# Patient Record
Sex: Female | Born: 1950 | Race: White | Hispanic: No | Marital: Married | Smoking: Former smoker
Health system: Southern US, Community
[De-identification: ages and names within clinical notes are randomized; demographics above are authoritative.]

## PROBLEM LIST (undated history)

## (undated) DIAGNOSIS — Z9889 Other specified postprocedural states: Secondary | ICD-10-CM

## (undated) DIAGNOSIS — K222 Esophageal obstruction: Secondary | ICD-10-CM

## (undated) DIAGNOSIS — N61 Mastitis without abscess: Secondary | ICD-10-CM

## (undated) DIAGNOSIS — M810 Age-related osteoporosis without current pathological fracture: Secondary | ICD-10-CM

## (undated) DIAGNOSIS — M1990 Unspecified osteoarthritis, unspecified site: Secondary | ICD-10-CM

## (undated) DIAGNOSIS — R002 Palpitations: Secondary | ICD-10-CM

## (undated) DIAGNOSIS — R079 Chest pain, unspecified: Secondary | ICD-10-CM

## (undated) DIAGNOSIS — F419 Anxiety disorder, unspecified: Secondary | ICD-10-CM

## (undated) DIAGNOSIS — M797 Fibromyalgia: Secondary | ICD-10-CM

## (undated) DIAGNOSIS — Z8489 Family history of other specified conditions: Secondary | ICD-10-CM

## (undated) DIAGNOSIS — I10 Essential (primary) hypertension: Secondary | ICD-10-CM

## (undated) DIAGNOSIS — R112 Nausea with vomiting, unspecified: Secondary | ICD-10-CM

## (undated) DIAGNOSIS — R251 Tremor, unspecified: Secondary | ICD-10-CM

## (undated) DIAGNOSIS — J189 Pneumonia, unspecified organism: Secondary | ICD-10-CM

## (undated) DIAGNOSIS — K219 Gastro-esophageal reflux disease without esophagitis: Secondary | ICD-10-CM

## (undated) DIAGNOSIS — T4145XA Adverse effect of unspecified anesthetic, initial encounter: Secondary | ICD-10-CM

## (undated) DIAGNOSIS — M1612 Unilateral primary osteoarthritis, left hip: Secondary | ICD-10-CM

## (undated) DIAGNOSIS — F32A Depression, unspecified: Secondary | ICD-10-CM

## (undated) DIAGNOSIS — T8859XA Other complications of anesthesia, initial encounter: Secondary | ICD-10-CM

## (undated) DIAGNOSIS — R011 Cardiac murmur, unspecified: Secondary | ICD-10-CM

## (undated) DIAGNOSIS — F329 Major depressive disorder, single episode, unspecified: Secondary | ICD-10-CM

## (undated) DIAGNOSIS — C50919 Malignant neoplasm of unspecified site of unspecified female breast: Secondary | ICD-10-CM

## (undated) DIAGNOSIS — K5790 Diverticulosis of intestine, part unspecified, without perforation or abscess without bleeding: Secondary | ICD-10-CM

## (undated) DIAGNOSIS — Z923 Personal history of irradiation: Secondary | ICD-10-CM

## (undated) DIAGNOSIS — R0602 Shortness of breath: Secondary | ICD-10-CM

## (undated) HISTORY — DX: Tremor, unspecified: R25.1

## (undated) HISTORY — PX: APPENDECTOMY: SHX54

## (undated) HISTORY — PX: CHOLECYSTECTOMY: SHX55

## (undated) HISTORY — DX: Shortness of breath: R06.02

## (undated) HISTORY — DX: Gastro-esophageal reflux disease without esophagitis: K21.9

## (undated) HISTORY — DX: Malignant neoplasm of unspecified site of unspecified female breast: C50.919

## (undated) HISTORY — PX: TUBAL LIGATION: SHX77

## (undated) HISTORY — PX: COLONOSCOPY: SHX174

## (undated) HISTORY — PX: ABDOMINAL HYSTERECTOMY: SHX81

## (undated) HISTORY — PX: CYSTECTOMY: SUR359

## (undated) HISTORY — PX: FRACTURE SURGERY: SHX138

## (undated) HISTORY — PX: TONSILLECTOMY: SUR1361

## (undated) HISTORY — DX: Mastitis without abscess: N61.0

## (undated) HISTORY — DX: Palpitations: R00.2

## (undated) HISTORY — DX: Diverticulosis of intestine, part unspecified, without perforation or abscess without bleeding: K57.90

## (undated) HISTORY — DX: Esophageal obstruction: K22.2

## (undated) HISTORY — PX: HAND SURGERY: SHX662

## (undated) HISTORY — DX: Chest pain, unspecified: R07.9

---
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
## 1978-11-30 DIAGNOSIS — F339 Major depressive disorder, recurrent, unspecified: Secondary | ICD-10-CM | POA: Insufficient documentation

## 1998-09-26 ENCOUNTER — Encounter: Payer: Self-pay | Admitting: Surgery

## 1998-09-27 ENCOUNTER — Ambulatory Visit (HOSPITAL_COMMUNITY): Admission: RE | Admit: 1998-09-27 | Discharge: 1998-09-27 | Payer: Self-pay | Admitting: Surgery

## 1999-09-30 ENCOUNTER — Other Ambulatory Visit: Admission: RE | Admit: 1999-09-30 | Discharge: 1999-09-30 | Payer: Self-pay | Admitting: Obstetrics and Gynecology

## 2000-04-14 ENCOUNTER — Encounter: Admission: RE | Admit: 2000-04-14 | Discharge: 2000-04-14 | Payer: Self-pay | Admitting: Otolaryngology

## 2000-04-14 ENCOUNTER — Encounter: Payer: Self-pay | Admitting: Otolaryngology

## 2000-05-06 ENCOUNTER — Other Ambulatory Visit: Admission: RE | Admit: 2000-05-06 | Discharge: 2000-05-06 | Payer: Self-pay | Admitting: Otolaryngology

## 2000-05-06 ENCOUNTER — Encounter (INDEPENDENT_AMBULATORY_CARE_PROVIDER_SITE_OTHER): Payer: Self-pay | Admitting: Specialist

## 2000-12-13 ENCOUNTER — Other Ambulatory Visit: Admission: RE | Admit: 2000-12-13 | Discharge: 2000-12-13 | Payer: Self-pay | Admitting: Obstetrics and Gynecology

## 2001-04-08 ENCOUNTER — Encounter: Payer: Self-pay | Admitting: Internal Medicine

## 2001-04-15 ENCOUNTER — Encounter: Payer: Self-pay | Admitting: Internal Medicine

## 2002-01-04 ENCOUNTER — Other Ambulatory Visit: Admission: RE | Admit: 2002-01-04 | Discharge: 2002-01-04 | Payer: Self-pay | Admitting: Obstetrics and Gynecology

## 2002-01-04 ENCOUNTER — Encounter: Admission: RE | Admit: 2002-01-04 | Discharge: 2002-04-04 | Payer: Self-pay | Admitting: Obstetrics and Gynecology

## 2003-04-11 ENCOUNTER — Ambulatory Visit (HOSPITAL_BASED_OUTPATIENT_CLINIC_OR_DEPARTMENT_OTHER): Admission: RE | Admit: 2003-04-11 | Discharge: 2003-04-11 | Payer: Self-pay | Admitting: *Deleted

## 2003-04-11 ENCOUNTER — Encounter (INDEPENDENT_AMBULATORY_CARE_PROVIDER_SITE_OTHER): Payer: Self-pay | Admitting: *Deleted

## 2003-07-23 ENCOUNTER — Other Ambulatory Visit: Admission: RE | Admit: 2003-07-23 | Discharge: 2003-07-23 | Payer: Self-pay | Admitting: Obstetrics and Gynecology

## 2004-07-14 ENCOUNTER — Encounter: Admission: RE | Admit: 2004-07-14 | Discharge: 2004-07-14 | Payer: Self-pay | Admitting: Family Medicine

## 2004-07-25 ENCOUNTER — Encounter: Payer: Self-pay | Admitting: Internal Medicine

## 2005-09-01 ENCOUNTER — Encounter: Admission: RE | Admit: 2005-09-01 | Discharge: 2005-09-01 | Payer: Self-pay | Admitting: Family Medicine

## 2007-10-13 ENCOUNTER — Encounter: Admission: RE | Admit: 2007-10-13 | Discharge: 2007-10-13 | Payer: Self-pay | Admitting: Family Medicine

## 2008-11-30 DIAGNOSIS — C50919 Malignant neoplasm of unspecified site of unspecified female breast: Secondary | ICD-10-CM

## 2008-11-30 HISTORY — PX: BREAST LUMPECTOMY: SHX2

## 2008-11-30 HISTORY — DX: Malignant neoplasm of unspecified site of unspecified female breast: C50.919

## 2008-12-17 ENCOUNTER — Encounter: Admission: RE | Admit: 2008-12-17 | Discharge: 2008-12-17 | Payer: Self-pay | Admitting: Family Medicine

## 2008-12-19 ENCOUNTER — Encounter: Admission: RE | Admit: 2008-12-19 | Discharge: 2008-12-19 | Payer: Self-pay | Admitting: Family Medicine

## 2008-12-28 ENCOUNTER — Encounter: Admission: RE | Admit: 2008-12-28 | Discharge: 2008-12-28 | Payer: Self-pay | Admitting: Family Medicine

## 2008-12-28 ENCOUNTER — Encounter (INDEPENDENT_AMBULATORY_CARE_PROVIDER_SITE_OTHER): Payer: Self-pay | Admitting: Diagnostic Radiology

## 2009-01-08 ENCOUNTER — Encounter: Admission: RE | Admit: 2009-01-08 | Discharge: 2009-01-08 | Payer: Self-pay | Admitting: Family Medicine

## 2009-01-22 ENCOUNTER — Ambulatory Visit (HOSPITAL_BASED_OUTPATIENT_CLINIC_OR_DEPARTMENT_OTHER): Admission: RE | Admit: 2009-01-22 | Discharge: 2009-01-22 | Payer: Self-pay | Admitting: Surgery

## 2009-01-22 ENCOUNTER — Encounter (INDEPENDENT_AMBULATORY_CARE_PROVIDER_SITE_OTHER): Payer: Self-pay | Admitting: Surgery

## 2009-01-22 ENCOUNTER — Encounter: Admission: RE | Admit: 2009-01-22 | Discharge: 2009-01-22 | Payer: Self-pay | Admitting: Surgery

## 2009-01-28 ENCOUNTER — Ambulatory Visit: Payer: Self-pay | Admitting: Oncology

## 2009-01-29 ENCOUNTER — Encounter: Payer: Self-pay | Admitting: Internal Medicine

## 2009-02-06 ENCOUNTER — Encounter: Payer: Self-pay | Admitting: Internal Medicine

## 2009-02-06 LAB — CBC WITH DIFFERENTIAL/PLATELET
BASO%: 0.8 % (ref 0.0–2.0)
Eosinophils Absolute: 0.3 10*3/uL (ref 0.0–0.5)
HCT: 38.7 % (ref 34.8–46.6)
LYMPH%: 29 % (ref 14.0–49.7)
MCHC: 33.9 g/dL (ref 31.5–36.0)
MCV: 89.1 fL (ref 79.5–101.0)
MONO%: 7.2 % (ref 0.0–14.0)
NEUT%: 61 % (ref 38.4–76.8)
Platelets: 307 10*3/uL (ref 145–400)
RBC: 4.35 10*6/uL (ref 3.70–5.45)

## 2009-02-08 LAB — COMPREHENSIVE METABOLIC PANEL
Alkaline Phosphatase: 158 U/L — ABNORMAL HIGH (ref 39–117)
Creatinine, Ser: 0.72 mg/dL (ref 0.40–1.20)
Glucose, Bld: 126 mg/dL — ABNORMAL HIGH (ref 70–99)
Sodium: 136 mEq/L (ref 135–145)
Total Bilirubin: 0.6 mg/dL (ref 0.3–1.2)
Total Protein: 7.3 g/dL (ref 6.0–8.3)

## 2009-02-08 LAB — LACTATE DEHYDROGENASE: LDH: 160 U/L (ref 94–250)

## 2009-02-08 LAB — CANCER ANTIGEN 27.29: CA 27.29: 15 U/mL (ref 0–39)

## 2009-02-12 ENCOUNTER — Ambulatory Visit (HOSPITAL_COMMUNITY): Admission: RE | Admit: 2009-02-12 | Discharge: 2009-02-12 | Payer: Self-pay | Admitting: Oncology

## 2009-02-12 ENCOUNTER — Encounter (INDEPENDENT_AMBULATORY_CARE_PROVIDER_SITE_OTHER): Payer: Self-pay | Admitting: *Deleted

## 2009-02-14 ENCOUNTER — Ambulatory Visit: Admission: RE | Admit: 2009-02-14 | Discharge: 2009-04-30 | Payer: Self-pay | Admitting: Radiation Oncology

## 2009-02-15 ENCOUNTER — Encounter: Payer: Self-pay | Admitting: Internal Medicine

## 2009-02-18 ENCOUNTER — Encounter: Payer: Self-pay | Admitting: Internal Medicine

## 2009-02-19 ENCOUNTER — Encounter: Payer: Self-pay | Admitting: Oncology

## 2009-02-19 ENCOUNTER — Other Ambulatory Visit: Admission: RE | Admit: 2009-02-19 | Discharge: 2009-02-19 | Payer: Self-pay | Admitting: Oncology

## 2009-02-19 LAB — CBC WITH DIFFERENTIAL/PLATELET
Basophils Absolute: 0.2 10*3/uL — ABNORMAL HIGH (ref 0.0–0.1)
Eosinophils Absolute: 0.4 10*3/uL (ref 0.0–0.5)
HCT: 40.9 % (ref 34.8–46.6)
HGB: 13.6 g/dL (ref 11.6–15.9)
LYMPH%: 35.1 % (ref 14.0–49.7)
MCHC: 33.3 g/dL (ref 31.5–36.0)
MONO#: 1.4 10*3/uL — ABNORMAL HIGH (ref 0.1–0.9)
NEUT#: 8 10*3/uL — ABNORMAL HIGH (ref 1.5–6.5)
NEUT%: 52.3 % (ref 38.4–76.8)
Platelets: 349 10*3/uL (ref 145–400)
WBC: 15.3 10*3/uL — ABNORMAL HIGH (ref 3.9–10.3)

## 2009-02-19 LAB — CHCC SMEAR

## 2009-02-19 LAB — MORPHOLOGY
PLT EST: ADEQUATE
RBC Comments: NORMAL

## 2009-03-06 ENCOUNTER — Encounter: Admission: RE | Admit: 2009-03-06 | Discharge: 2009-03-06 | Payer: Self-pay | Admitting: Oncology

## 2009-03-25 ENCOUNTER — Ambulatory Visit: Payer: Self-pay | Admitting: Oncology

## 2009-03-27 LAB — TSH: TSH: 0.822 u[IU]/mL (ref 0.350–4.500)

## 2009-03-27 LAB — FSH/LH: LH: 22.1 m[IU]/mL

## 2009-04-24 ENCOUNTER — Ambulatory Visit: Payer: Self-pay | Admitting: Internal Medicine

## 2009-04-24 DIAGNOSIS — K222 Esophageal obstruction: Secondary | ICD-10-CM

## 2009-04-24 DIAGNOSIS — K589 Irritable bowel syndrome without diarrhea: Secondary | ICD-10-CM

## 2009-04-24 DIAGNOSIS — Z86010 Personal history of colonic polyps: Secondary | ICD-10-CM

## 2009-04-24 DIAGNOSIS — K219 Gastro-esophageal reflux disease without esophagitis: Secondary | ICD-10-CM

## 2009-04-24 DIAGNOSIS — R197 Diarrhea, unspecified: Secondary | ICD-10-CM

## 2009-04-24 DIAGNOSIS — E119 Type 2 diabetes mellitus without complications: Secondary | ICD-10-CM

## 2009-04-30 ENCOUNTER — Ambulatory Visit: Payer: Self-pay | Admitting: Internal Medicine

## 2009-07-30 ENCOUNTER — Ambulatory Visit: Payer: Self-pay | Admitting: Oncology

## 2010-05-02 ENCOUNTER — Encounter: Payer: Self-pay | Admitting: Family Medicine

## 2010-05-27 IMAGING — MG MM SCREEN MAMMOGRAM BILATERAL
4 series · 4 of 4 positions shown · non-contrast
Comparison: none

DG SCREEN MAMMOGRAM BILATERAL
Bilateral CC and MLO view(s) were taken.

DIGITAL SCREENING MAMMOGRAM WITH CAD:
There are scattered fibroglandular densities.  Possible asymmetries are noted in both breasts.  
Spot compression views and possibly sonography are recommended for further evaluation.  Compared 
with prior studies.

[R CC]
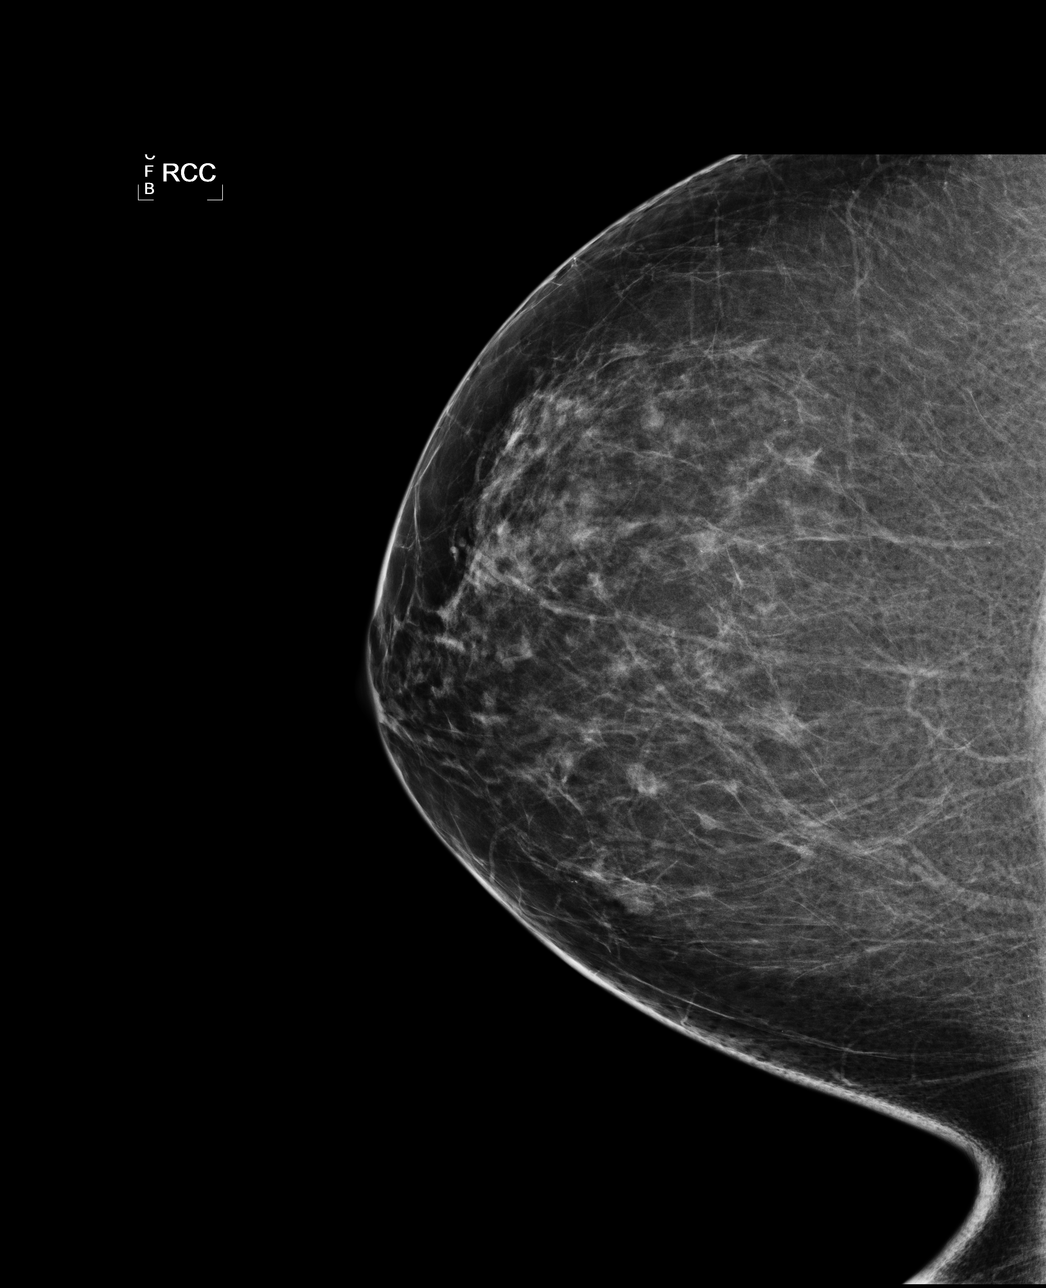

[L CC]
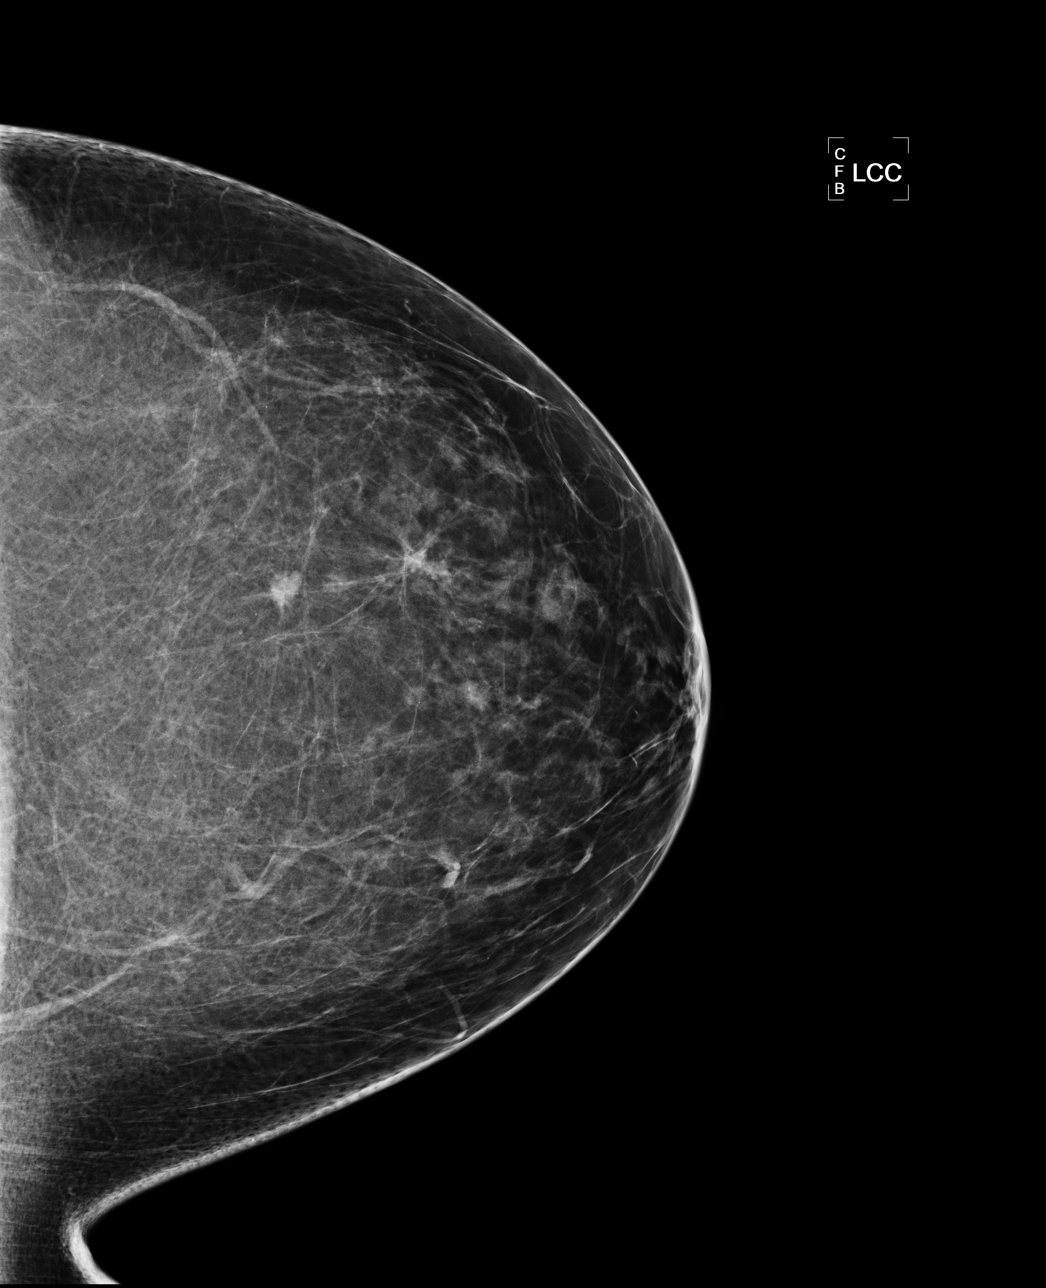

[L MLO]
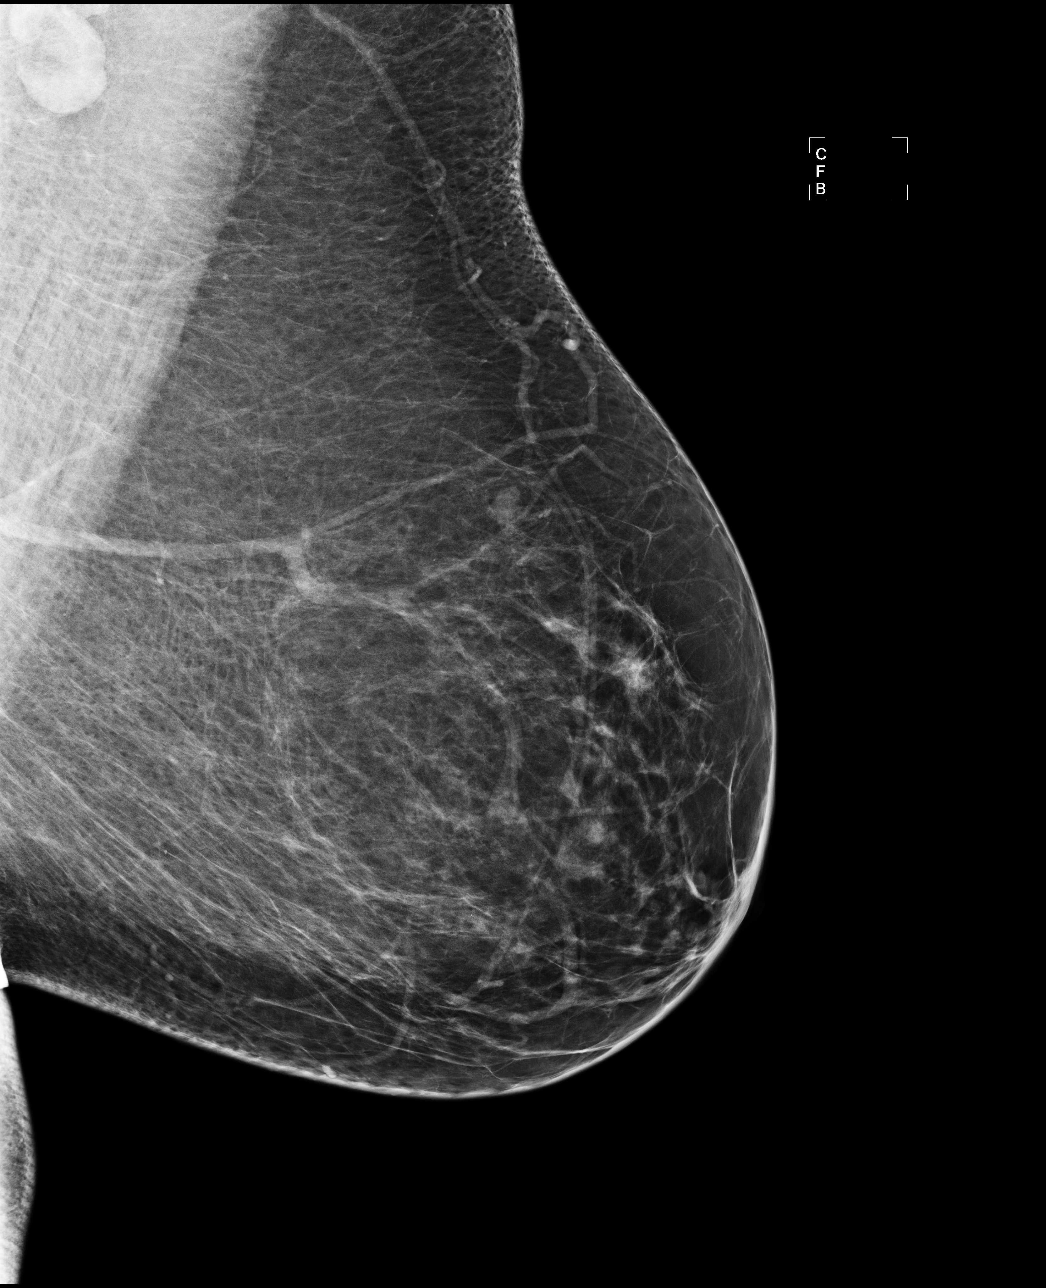

[R MLO]
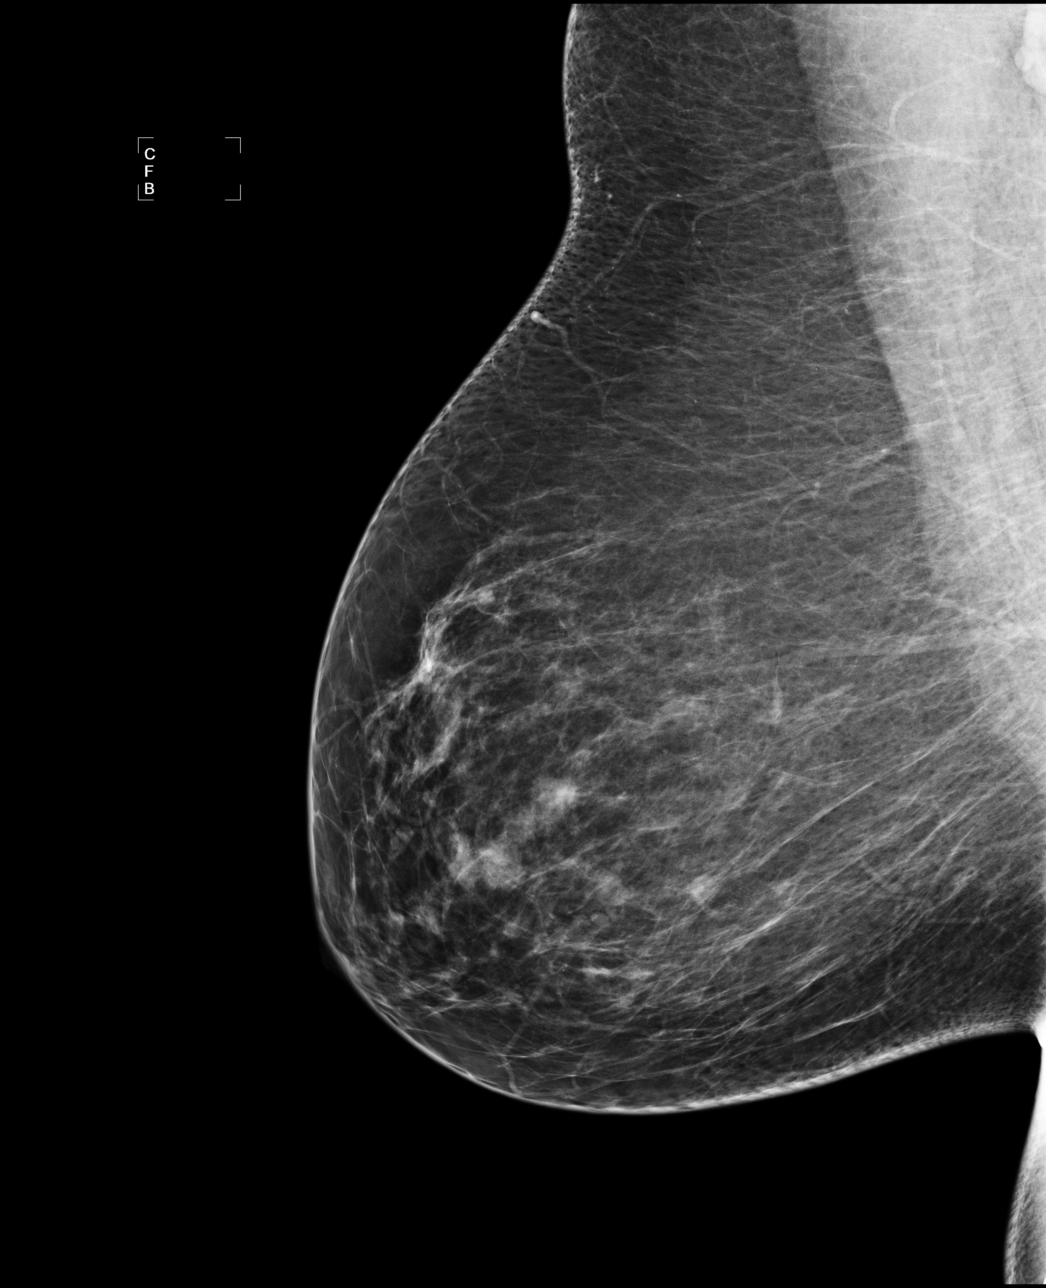

[4 of 4 positions shown; findings below may reference images not displayed]

IMPRESSION: Possible asymmetries, bilateral breasts.  Additional evaluation is indicated.  The patient will be 
contacted for additional studies and a supplementary report will follow.

ASSESSMENT: Need additional imaging evaluation and/or prior mammograms for comparison - BI-RADS 0

Further imaging of both breasts.
ANALYZED BY COMPUTER AIDED DETECTION. , THIS PROCEDURE WAS A DIGITAL MAMMOGRAM.

## 2010-06-03 ENCOUNTER — Ambulatory Visit: Payer: Self-pay | Admitting: Oncology

## 2010-06-05 LAB — CBC WITH DIFFERENTIAL/PLATELET
BASO%: 1.6 % (ref 0.0–2.0)
EOS%: 2.1 % (ref 0.0–7.0)
Eosinophils Absolute: 0.2 10*3/uL (ref 0.0–0.5)
HCT: 39 % (ref 34.8–46.6)
LYMPH%: 28.6 % (ref 14.0–49.7)
MONO#: 0.8 10*3/uL (ref 0.1–0.9)
NEUT#: 5.8 10*3/uL (ref 1.5–6.5)
RDW: 13.6 % (ref 11.2–14.5)
lymph#: 2.8 10*3/uL (ref 0.9–3.3)

## 2010-06-06 LAB — COMPREHENSIVE METABOLIC PANEL
ALT: 68 U/L — ABNORMAL HIGH (ref 0–35)
AST: 114 U/L — ABNORMAL HIGH (ref 0–37)
CO2: 22 mEq/L (ref 19–32)
Calcium: 9.1 mg/dL (ref 8.4–10.5)
Total Bilirubin: 0.6 mg/dL (ref 0.3–1.2)
Total Protein: 7.2 g/dL (ref 6.0–8.3)

## 2010-06-06 LAB — VITAMIN D 25 HYDROXY (VIT D DEFICIENCY, FRACTURES): Vit D, 25-Hydroxy: 32 ng/mL (ref 30–89)

## 2010-06-06 LAB — LACTATE DEHYDROGENASE: LDH: 172 U/L (ref 94–250)

## 2010-06-16 ENCOUNTER — Ambulatory Visit (HOSPITAL_COMMUNITY): Admission: RE | Admit: 2010-06-16 | Discharge: 2010-06-16 | Payer: Self-pay | Admitting: Oncology

## 2010-08-22 ENCOUNTER — Encounter: Admission: RE | Admit: 2010-08-22 | Discharge: 2010-08-22 | Payer: Self-pay | Admitting: Neurosurgery

## 2010-12-11 ENCOUNTER — Ambulatory Visit: Payer: Self-pay | Admitting: Oncology

## 2011-03-09 LAB — GLUCOSE, CAPILLARY

## 2011-03-12 LAB — GLUCOSE, CAPILLARY: Glucose-Capillary: 170 mg/dL — ABNORMAL HIGH (ref 70–99)

## 2011-03-17 LAB — CBC
Platelets: 266 10*3/uL (ref 150–400)
WBC: 11.7 10*3/uL — ABNORMAL HIGH (ref 4.0–10.5)

## 2011-03-17 LAB — COMPREHENSIVE METABOLIC PANEL
AST: 66 U/L — ABNORMAL HIGH (ref 0–37)
Albumin: 4 g/dL (ref 3.5–5.2)
Alkaline Phosphatase: 132 U/L — ABNORMAL HIGH (ref 39–117)
Chloride: 101 mEq/L (ref 96–112)
Creatinine, Ser: 0.71 mg/dL (ref 0.4–1.2)
GFR calc Af Amer: >60 mL/min (ref >60)
Potassium: 5.1 mEq/L (ref 3.5–5.1)
Sodium: 138 mEq/L (ref 135–145)
Total Bilirubin: 0.6 mg/dL (ref 0.3–1.2)

## 2011-03-17 LAB — DIFFERENTIAL
Basophils Absolute: 0.1 10*3/uL (ref 0.0–0.1)
Eosinophils Relative: 2 % (ref 0–5)
Lymphocytes Relative: 36 % (ref 12–46)
Monocytes Absolute: 0.7 10*3/uL (ref 0.1–1.0)

## 2011-04-14 NOTE — Op Note (Signed)
NAME:  Cassandra Johnston, Cassandra Johnston               ACCOUNT NO.:  [id number]   MEDICAL RECORD NO.:  [id number]          PATIENT TYPE:  AMB   LOCATION:  DSC                          FACILITY:  [facility]   PHYSICIAN:  Sandria Bales. Ezzard Standing, M.D.  DATE OF BIRTH:  January 03, 1951   DATE OF PROCEDURE:  01/22/2009  DATE OF DISCHARGE:                               OPERATIVE REPORT   Date of Surgery ?   PREOPERATIVE DIAGNOSIS:  Left breast carcinoma approximately 8 mm by  ultrasound at the 2 o'clock position.   POSTOPERATIVE DIAGNOSES:  Left breast carcinoma approximately 8 mm by  ultrasound at the 2 o'clock position with a negative sentinel lymph node  biopsy read by Dr. Brain Hilts (T1b N0 Mx).   PROCEDURES:  1. Injection of left breast with methylene blue.  2. Left breast lumpectomy with needle local.  3. Left axillary sentinel lymph node biopsy.   SURGEON:  Sandria Bales. Ezzard Standing, MD   FIRST ASSISTANT:  None.   ANESTHESIA:  General endotracheal about 30 mL of 0.25% Marcaine.   COMPLICATIONS:  None.   INDICATIONS FOR PROCEDURE:  Cassandra Johnston is a 60 year old white female,  patient of Dr. Evelena Peat who had a recent mammogram that showed a  lesion in both breasts.  In the left breast about the 1 o'clock  position, she had a nodule measuring 8 mm, which underwent biopsy which  showed invasive ductal carcinoma.  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   I discussed with her the options for treating this which include  mastectomy versus lumpectomy.  I think, she is a candidate for  lumpectomy.  I discussed the indications and potential risk of surgery.  Potential risks include bleeding, infection, nerve injury, and  recurrence of the tumor.  I also talked about a sentinel lymph node  biopsy.  She now comes for attempted lumpectomy and sentinel lymph node  biopsy with possible axillary node dissection.   OPERATIVE NOTE:  The patient was placed in a supine position.  She was  admitted to the breast  center and put a wire in her left breast coming  out at about 2-3 o'clock position.  The breast and axilla were prepped  with Betadine solution and sterilely draped and a time-out was held  identifying the patient and procedure.  I had marked her left breast  preoperatively.  A time out was held to identify the patient and the  procedure.   I first went down to find the axillary sentinel lymph node.  She had  been injected with 1 mCi of silver chloride preoperatively.  I had also  immediately preop injected her with about a mL and a half of 60%  methylene blue.  I cut down and found a sentinel lymph node with counts  of 1600 and a background was less than 10.  I think it was like 2 nodes  were sent, Dr. Nedra Hai called back and said that touch preps of these lymph  nodes were negative.  I removed some additional fat tissue, which had no  count and apparently, did not  have any lymph nodes, but I sent it off  anyway as a separate specimen.  I then put a gauze in the left axilla.   I then turned my attention to the left breast.  Again, she had a wire  emanating from left breast in the lower quadrant in the 1 to 2 o'clock  position about 3-4 cm off the edge of the areola.  I made an elliptical  incision and excised a block of breast tissue of 4 cm in diameter.  Once  the block of breast tissue was removed, I used the paint kit to paint  the specimen oriented to 6 sides.  We then shot a specimen mammogram at  the breast center.  The images showed the clip and the wire were in good  position and we marked the specimen for pathology.   So, the specimen was felt to be adequate.  I then went back and examined  the axilla and the breast.  I controlled hemostasis with Bovie  electrocautery using 3-0 Vicryl sutures also.  I then closed a  subcutaneous tissue with interrupted 3-0 Vicryl suture.  The skin with a  5-0 subcuticular Vicryl suture, painted the wounds with tincture of  Benzoin and  Steri-Strips.   The patient tolerated the procedure well and was transported to recovery  room in good condition.  Sponge and needle count were correct at the end  of case.  The patient will be discharged home today to return to see me  in 7-14 days for followup.      Sandria Bales. Ezzard Standing, M.D.  Electronically Signed     DHN/MEDQ  D:  01/22/2009  T:  01/23/2009  Job:  [id number]   cc:   Evelena Peat, M.D.  Wilhemina Bonito. Marina Goodell, MD

## 2011-04-15 ENCOUNTER — Other Ambulatory Visit: Payer: Self-pay | Admitting: Surgery

## 2011-04-15 ENCOUNTER — Other Ambulatory Visit: Payer: Self-pay | Admitting: Family Medicine

## 2011-04-15 DIAGNOSIS — Z9889 Other specified postprocedural states: Secondary | ICD-10-CM

## 2011-04-17 NOTE — Op Note (Signed)
   NAME:  Cassandra Johnston, Cassandra Johnston                         ACCOUNT NO.:  [id number]   MEDICAL RECORD NO.:  [id number]                   PATIENT TYPE:  AMB   LOCATION:  DSC                                  FACILITY:  [facility]   PHYSICIAN:  Lowell Bouton, M.D.      DATE OF BIRTH:  [birth year]   DATE OF PROCEDURE:  04/11/2003  DATE OF DISCHARGE:                                 OPERATIVE REPORT   PREOPERATIVE DIAGNOSIS:  Dorsal ganglion, left wrist.   POSTOPERATIVE DIAGNOSIS:  Dorsal ganglion, left wrist.   OPERATION PERFORMED:  Excision of dorsal ganglion, left wrist.   SURGEON:  Lowell Bouton, M.D.   ANESTHESIA:  General.   OPERATIVE FINDINGS:  The patient had a very small dorsal ganglion that arose  from the scapholunate interval.  She also had extension of her muscle belly  down to the carpus level of the EIP.   DESCRIPTION OF PROCEDURE:  Under general anesthesia with a tourniquet on the  left arm, the left hand was prepped and draped in the usual fashion.  After  exsanguinating the limb, the tourniquet was inflated to 250 mmHg.  A  transverse incision was made over the distal wrist crease.  Sharp dissection  was carried through the subcutaneous tissues.  Blunt dissection was carried  down through the subcutaneous fat and bleeding points were coagulated.  Blunt dissection was carried down to the mass and it was excised sharply  with a knife.  The capsule of the joint was opened and the mass was  completely excised.  The joint surfaces appeared to be relatively smooth.  It was noted that the EIP muscle extended down beyond the incision and so  that portion of the muscle was excised on the distal part.  The wound was  then irrigated copiously with saline.  The skin was closed over a vessel  loop drain using a 3-0 subcuticular Prolene.  Steri-Strips were applied  followed by sterile dressings and a volar wrist splint.  The patient  tolerated the procedure well and went to  the recovery room awake and stable  in  good condition.                                                Lowell Bouton, M.D.    EMM/MEDQ  D:  04/11/2003  T:  04/12/2003  Job:  [id number]

## 2011-07-08 ENCOUNTER — Other Ambulatory Visit: Payer: Self-pay | Admitting: Oncology

## 2011-07-08 ENCOUNTER — Encounter (HOSPITAL_BASED_OUTPATIENT_CLINIC_OR_DEPARTMENT_OTHER): Payer: Medicare Other | Admitting: Oncology

## 2011-07-08 DIAGNOSIS — E119 Type 2 diabetes mellitus without complications: Secondary | ICD-10-CM

## 2011-07-08 DIAGNOSIS — C50419 Malignant neoplasm of upper-outer quadrant of unspecified female breast: Secondary | ICD-10-CM

## 2011-07-08 DIAGNOSIS — Z170 Estrogen receptor positive status [ER+]: Secondary | ICD-10-CM

## 2011-07-08 LAB — COMPREHENSIVE METABOLIC PANEL
BUN: 11 mg/dL (ref 6–23)
CO2: 30 mEq/L (ref 19–32)
Creatinine, Ser: 0.71 mg/dL (ref 0.50–1.10)
Glucose, Bld: 252 mg/dL — ABNORMAL HIGH (ref 70–99)
Total Bilirubin: 0.3 mg/dL (ref 0.3–1.2)

## 2011-07-08 LAB — CBC WITH DIFFERENTIAL/PLATELET
Basophils Absolute: 0.1 10*3/uL (ref 0.0–0.1)
Eosinophils Absolute: 0.2 10*3/uL (ref 0.0–0.5)
HCT: 39.5 % (ref 34.8–46.6)
LYMPH%: 34.3 % (ref 14.0–49.7)
MCHC: 34.1 g/dL (ref 31.5–36.0)
MONO#: 0.7 10*3/uL (ref 0.1–0.9)
NEUT%: 55.1 % (ref 38.4–76.8)
Platelets: 191 10*3/uL (ref 145–400)
WBC: 9.6 10*3/uL (ref 3.9–10.3)

## 2011-07-09 LAB — VITAMIN D 25 HYDROXY (VIT D DEFICIENCY, FRACTURES): Vit D, 25-Hydroxy: 32 ng/mL (ref 30–89)

## 2011-07-09 LAB — CANCER ANTIGEN 27.29: CA 27.29: 17 U/mL (ref 0–39)

## 2011-07-16 ENCOUNTER — Ambulatory Visit
Admission: RE | Admit: 2011-07-16 | Discharge: 2011-07-16 | Disposition: A | Discharge status: Home / Self Care | Payer: Medicare Other | Source: Ambulatory Visit | Attending: Family Medicine | Admitting: Family Medicine

## 2011-07-16 DIAGNOSIS — Z9889 Other specified postprocedural states: Secondary | ICD-10-CM

## 2011-07-17 ENCOUNTER — Other Ambulatory Visit: Payer: Self-pay | Admitting: Dermatology

## 2011-08-05 ENCOUNTER — Other Ambulatory Visit: Payer: Self-pay | Admitting: Emergency Medicine

## 2011-08-05 ENCOUNTER — Ambulatory Visit
Admission: RE | Admit: 2011-08-05 | Discharge: 2011-08-05 | Disposition: A | Discharge status: Home / Self Care | Payer: Medicare Other | Source: Ambulatory Visit | Attending: Emergency Medicine | Admitting: Emergency Medicine

## 2011-08-05 ENCOUNTER — Inpatient Hospital Stay (INDEPENDENT_AMBULATORY_CARE_PROVIDER_SITE_OTHER): Admission: RE | Admit: 2011-08-05 | Payer: Medicare Other | Source: Ambulatory Visit | Admitting: Emergency Medicine

## 2011-08-05 ENCOUNTER — Encounter: Payer: Self-pay | Admitting: Emergency Medicine

## 2011-08-05 ENCOUNTER — Ambulatory Visit (INDEPENDENT_AMBULATORY_CARE_PROVIDER_SITE_OTHER): Payer: Medicare Other | Admitting: General Surgery

## 2011-08-05 VITALS — BP 138/86 | HR 66 | Temp 98.1°F | Ht 64.5 in | Wt 196.0 lb

## 2011-08-05 DIAGNOSIS — Z9889 Other specified postprocedural states: Secondary | ICD-10-CM

## 2011-08-05 DIAGNOSIS — N61 Mastitis without abscess: Secondary | ICD-10-CM

## 2011-08-05 NOTE — Progress Notes (Signed)
Patient is a 60 year old female with a history of left breast lumpectomy, sentinel lymph node biopsy, and radiation in 2010 for cancer of the left breast. She has not followed up with Dr. Ezzard Standing since. She has been on adjuvant tamoxifen. She reports no problems until about 2 days ago. She initially noted some fever. Yesterday she then noticed some pain in her left breast but did not examine it. Today she had a fever to 102 and redness of her breast.  She was initially seen at the breast center and has had an ultrasound that reportedly shows no abscess or fluid collection.  She does not have any previous history of cellulitis.  Exam: She is in no distress. She is afebrile in our office. The left breast shows very marked bright well defined erythema over the entire breast but not involving the axilla or chest. There is some edema of the skin. There is no mass, thickening, or particular tenderness.  Assessment and plan: Left breast cellulitis with history of lumpectomy, sentinel node biopsy and radiation in 2010. I have started her on oral Augmentin. She will call for any worsening symptoms and otherwise return to the office in 48 hours for a recheck.

## 2011-08-05 NOTE — Patient Instructions (Signed)
Call for any worsening symptoms.

## 2011-08-07 ENCOUNTER — Ambulatory Visit (INDEPENDENT_AMBULATORY_CARE_PROVIDER_SITE_OTHER): Payer: Medicare Other | Admitting: General Surgery

## 2011-08-07 VITALS — BP 128/80 | HR 62

## 2011-08-07 DIAGNOSIS — N61 Mastitis without abscess: Secondary | ICD-10-CM

## 2011-08-07 NOTE — Patient Instructions (Signed)
Call if not continuing to improve

## 2011-08-07 NOTE — Progress Notes (Signed)
Patient returns for a recheck of the cellulitis of her left breast with history of lumpectomy and sentinel lymph node biopsy remotely. We started her on Augmentin 2 days ago. She feels better in terms of her breast being less inflamed. Still some pain.  On examination there is still significant cellulitis of the left breast but it is clearly somewhat less erythematous and I believe has regressed slightly  This think is responding to antibiotics. I will not be in the office for over 2 weeks and will have her followup with Dr. Ezzard Standing in about 2 weeks.

## 2011-08-20 ENCOUNTER — Encounter (INDEPENDENT_AMBULATORY_CARE_PROVIDER_SITE_OTHER): Payer: Medicare Other | Admitting: Surgery

## 2011-09-15 ENCOUNTER — Encounter (INDEPENDENT_AMBULATORY_CARE_PROVIDER_SITE_OTHER): Payer: Self-pay

## 2011-09-18 ENCOUNTER — Encounter (INDEPENDENT_AMBULATORY_CARE_PROVIDER_SITE_OTHER): Payer: Medicare Other | Admitting: Surgery

## 2011-09-25 ENCOUNTER — Encounter: Payer: Self-pay | Admitting: Family Medicine

## 2011-09-25 ENCOUNTER — Inpatient Hospital Stay (INDEPENDENT_AMBULATORY_CARE_PROVIDER_SITE_OTHER)
Admission: RE | Admit: 2011-09-25 | Discharge: 2011-09-25 | Disposition: A | Discharge status: Home / Self Care | Payer: Medicare Other | Source: Ambulatory Visit | Attending: Family Medicine | Admitting: Family Medicine

## 2011-09-25 DIAGNOSIS — J029 Acute pharyngitis, unspecified: Secondary | ICD-10-CM

## 2011-09-25 DIAGNOSIS — R05 Cough: Secondary | ICD-10-CM

## 2011-09-25 DIAGNOSIS — E782 Mixed hyperlipidemia: Secondary | ICD-10-CM | POA: Insufficient documentation

## 2011-09-25 DIAGNOSIS — E785 Hyperlipidemia, unspecified: Secondary | ICD-10-CM

## 2011-09-25 DIAGNOSIS — J209 Acute bronchitis, unspecified: Secondary | ICD-10-CM

## 2011-09-25 LAB — CONVERTED CEMR LAB: Rapid Strep: NEGATIVE

## 2011-11-02 NOTE — Progress Notes (Signed)
Summary: infection on breast    Current Allergies: CODEINE Assessment New Problems: ABSCESS, BREAST (ICD-611.0)   Plan New Orders: No Charge Patient Arrived (NCPA0) [NCPA0] Planning Comments:   Pt sent directly to breast center in [place] due to her history of breast cancer and rapidly worsening breast infection/cellulitis.  She was not seen or evaluated here at our office.  Our nurse made her an appt for 1pm today.   The patient and/or caregiver has been counseled thoroughly with regard to medications prescribed including dosage, schedule, interactions, rationale for use, and possible side effects and they verbalize understanding.  Diagnoses and expected course of recovery discussed and will return if not improved as expected or if the condition worsens. Patient and/or caregiver verbalized understanding.   Orders Added: 1)  No Charge Patient Arrived (NCPA0) [NCPA0]

## 2011-11-02 NOTE — Progress Notes (Signed)
Summary: sore throat/cough/temp rm 4   Vital Signs:  Patient Profile:   60 Years Old Female CC:      cough, fever, sore throat, and hoarseness x 3 days Height:     64.5 inches Weight:      198 pounds O2 Sat:      95 % O2 treatment:    Room Air Temp:     100.2 degrees F oral Pulse rate:   85 / minute Resp:     20 per minute BP sitting:   136 / 74  (left arm) Cuff size:   large  Vitals Entered By: Clemens Catholic LPN (September 25, 2011 10:35 AM)                  Updated Prior Medication List: GLUCOPHAGE XR 500 MG XR24H-TAB (METFORMIN HCL) take 1 tablet by mouth in the morning and take 2 tablets by mouth in the evening GLIMEPIRIDE 4 MG TABS (GLIMEPIRIDE) Take 1 tablet by mouth once a day CITALOPRAM HYDROBROMIDE 20 MG TABS (CITALOPRAM HYDROBROMIDE) Take 1 tablet by mouth once a day FAMOTIDINE 40 MG TABS (FAMOTIDINE) Take 1 tablet by mouth once a day TAMOXIFEN CITRATE 20 MG TABS (TAMOXIFEN CITRATE) Take 1 tablet by mouth once a day  Current Allergies (reviewed today): CODEINEHistory of Present Illness Chief Complaint: cough, fever, sore throat, and hoarseness x 3 days History of Present Illness:  Subjective: Patient complains of URI symptoms that started 3 days ago with hoarseness and sinus congestion. + mild sore throat + cough for two days; non productive, worse at night.  She often coughs until she gags.  She states that she has not had a cough like this before.  She had an episode of pneumonia 8 years ago.  She believes that her last tetanus shot was 2006 No pleuritic pain No wheezing + nasal congestion + post-nasal drainage No sinus pain/pressure No itchy/red eyes No earache No hemoptysis No SOB No fever, + chills No nausea No vomiting No abdominal pain No diarrhea No skin rashes + fatigue No myalgias No headache Used OTC meds without relief (Mucinex)  REVIEW OF SYSTEMS Constitutional Symptoms       Complains of fever, chills, and night sweats.     Denies  weight loss, weight gain, and fatigue.  Eyes       Complains of glasses.      Denies change in vision, eye pain, eye discharge, contact lenses, and eye surgery. Ear/Nose/Throat/Mouth       Complains of sore throat and hoarseness.      Denies hearing loss/aids, change in hearing, ear pain, ear discharge, dizziness, frequent runny nose, frequent nose bleeds, sinus problems, and tooth pain or bleeding.  Respiratory       Complains of dry cough, wheezing, and shortness of breath.      Denies productive cough, asthma, bronchitis, and emphysema/COPD.  Cardiovascular       Denies murmurs, chest pain, and tires easily with exhertion.    Gastrointestinal       Denies stomach pain, nausea/vomiting, diarrhea, constipation, blood in bowel movements, and indigestion. Genitourniary       Denies painful urination, kidney stones, and loss of urinary control. Neurological       Complains of headaches.      Denies paralysis, seizures, and fainting/blackouts. Musculoskeletal       Denies muscle pain, joint pain, joint stiffness, decreased range of motion, redness, swelling, muscle weakness, and gout.  Skin  Denies bruising, unusual mles/lumps or sores, and hair/skin or nail changes.  Psych       Denies mood changes, temper/anger issues, anxiety/stress, speech problems, depression, and sleep problems. Blood-Lymph       Complains of unexplained lumps. Other Comments: pt c/o dry cough, fever, sore throat, and hoarseness x 3 days. she has taken Mucinex and Tylenol.   Past History:  Past Medical History: Anxiety Disorder Arthritis Breast Cancer Adenomatous Colon Polyps Diabetes Esophageal Stricture Hypertension Irritable Bowel Syndrome ? Trigeminal neuralgia Hyperlipidemia  Past Surgical History: Reviewed history from 04/24/2009 and no changes required. Breast-Lumpectomy left Cholecystectomy Knee Arthroscopy left Appendectomy Tonsillectomy Hysterectomy thyroid cyst removed  Family  History: Reviewed history from 04/24/2009 and no changes required. Family History of Colon Cancer: Mother, MGGM Family History of Diabetes: Mother  Social History: Reviewed history from 04/24/2009 and no changes required. Married, 1 girl, 1 boy Retired Patient is a former smoker. Quit 5 years ago Alcohol Use - no Illicit Drug Use - no Daily Caffeine Use 5 cups/day   Objective:  No acute distress; alert and oriented  Eyes:  Pupils are equal, round, and reactive to light and accomodation.  Extraocular movement is intact.  Conjunctivae are not inflamed.  Ears:  Canals normal.  Tympanic membranes normal.  Left TM may have small amount clear mucous behind it Nose:  Mildly congested turbinates.  No sinus tenderness  Pharynx:  Normal  Neck:  Supple.   Tender shotty anterior/posterior nodes are palpated bilaterally.  Lungs:  Clear to auscultation.  Breath sounds are equal.  No tenderness over sternum Heart:  Regular rate and rhythm without murmurs, rubs, or gallops.  Abdomen:  Nontender without masses or hepatosplenomegaly.  Bowel sounds are present.  No CVA or flank tenderness.  Extremities:  No edema.   Rapid strep test negative  Assessment New Problems: ACUTE PHARYNGITIS (ICD-462) BRONCHITIS, ACUTE (ICD-466.0) COUGH (ICD-786.2) HYPERLIPIDEMIA (ICD-272.4)  PROBABLY VIRAL URI, BUT COULD BE PERTUSSIS.  WILL TREAT AS A BRONCHITIS  Plan New Medications/Changes: BENZONATATE 200 MG CAPS (BENZONATATE) One by mouth hs as needed cough  #12 x 0, 09/25/2011, Donna Christen MD CLARITHROMYCIN 500 MG TABS (CLARITHROMYCIN) One Tab by mouth two times a day  #14 x 0, 09/25/2011, Donna Christen MD  New Orders: Pulse Oximetry (single measurment) [94760] Est. Patient Level IV [09811] Rapid Strep [91478] Planning Comments:   Begin Biaxin, expectorant/decongestant, topical decongestant, cough suppressant at bedtime.  Increase fluid intake. Recommend Tdap, pneumoccal vaccine, and flu shot when  well. Followup with PCP if not improving 7 to 10 days   The patient and/or caregiver has been counseled thoroughly with regard to medications prescribed including dosage, schedule, interactions, rationale for use, and possible side effects and they verbalize understanding.  Diagnoses and expected course of recovery discussed and will return if not improved as expected or if the condition worsens. Patient and/or caregiver verbalized understanding.  Prescriptions: BENZONATATE 200 MG CAPS (BENZONATATE) One by mouth hs as needed cough  #12 x 0   Entered and Authorized by:   Donna Christen MD   Signed by:   Donna Christen MD on 09/25/2011   Method used:   Print then Give to Patient   RxID:   [id number] CLARITHROMYCIN 500 MG TABS (CLARITHROMYCIN) One Tab by mouth two times a day  #14 x 0   Entered and Authorized by:   Donna Christen MD   Signed by:   Donna Christen MD on 09/25/2011   Method used:   Print then Give  to Patient   RxID:   [id number]   Patient Instructions: 1)  Take Mucinex (guaifenesin) twice daily for congestion. 2)  Increase fluid intake, rest. 3)  May add Sudafed for sinus congestion 4)  For sinus congestion may also use Afrin nasal spray (or generic oxymetazoline) twice daily for about 5 days.  Also recommend using saline nasal spray several times daily and/or saline nasal irrigation. 5)  Recommend Tdap, flu shot, and pneumococcal vaccine when well. 6)  Followup with family doctor if not improving 7 to 10 days.   Orders Added: 1)  Pulse Oximetry (single measurment) [94760] 2)  Est. Patient Level IV [14782] 3)  Rapid Strep [95621]    Laboratory Results  Date/Time Received: September 25, 2011 10:39 AM  Date/Time Reported: September 25, 2011 10:39 AM   Other Tests  Rapid Strep: negative  Kit Test Internal QC: Negative   (Normal Range: Negative)

## 2011-11-27 ENCOUNTER — Encounter (INDEPENDENT_AMBULATORY_CARE_PROVIDER_SITE_OTHER): Payer: Self-pay | Admitting: General Surgery

## 2011-11-27 ENCOUNTER — Ambulatory Visit (INDEPENDENT_AMBULATORY_CARE_PROVIDER_SITE_OTHER): Payer: Medicare Other | Admitting: General Surgery

## 2011-11-27 VITALS — BP 130/72 | HR 84 | Temp 98.2°F | Resp 20 | Ht 64.0 in | Wt 192.6 lb

## 2011-11-27 DIAGNOSIS — N61 Mastitis without abscess: Secondary | ICD-10-CM

## 2011-11-27 NOTE — Progress Notes (Signed)
History: Patient returns to the office due to recurrent cellulitis of her left breast. She has a remote history of lumpectomy and sentinel lymph node biopsy by Dr. Ezzard Standing. I saw her in September of this year with an acute cellulitis of her left breast. Recent mammogram had been normal. Ultrasound at that time was negative for mass or abscess. This responded over several days to oral Augmentin and as she felt well she did not return for followup. She was doing well until this morning when she awoke with fever and pain and tenderness in her left breast and again noted the severe redness. She was seen at [facility] in [place] and given 1 g of Rocephin IV and a prescription for doxycycline. She was referred here for evaluation.  Exam: Gen.: Moderately overweight Caucasian female in no distress she is afebrile. Breasts: Overlying a majority of the left breast is a sharply demarcated marked erythema and warmth. This does not extend down onto the chest wall. There is no induration or swelling or mass. Lymph nodes: No supraclavicular or axillary nodes palpable.  Assessment and plan: Recurrent cellulitis of the left breast with history of lumpectomy, sentinel lymph node biopsy and radiation. Imaging showed no evidence of recurrent cancer or abscess 3 months ago and based on her exam today I do not think that this needs to be repeated. She is receiving appropriate antibiotics. She will call us if there is any worsening or if it is not better after 3-4 days. We will get her back to see Dr. Ezzard Standing in 2 weeks.

## 2011-12-08 ENCOUNTER — Encounter (INDEPENDENT_AMBULATORY_CARE_PROVIDER_SITE_OTHER): Payer: Medicare Other | Admitting: General Surgery

## 2011-12-11 ENCOUNTER — Encounter (INDEPENDENT_AMBULATORY_CARE_PROVIDER_SITE_OTHER): Payer: Medicare Other | Admitting: Surgery

## 2011-12-25 ENCOUNTER — Telehealth (INDEPENDENT_AMBULATORY_CARE_PROVIDER_SITE_OTHER): Payer: Self-pay | Admitting: General Surgery

## 2011-12-25 NOTE — Telephone Encounter (Signed)
I CALLED RX OF DOXYCYCLINE 100MG  BID X 10DAYS TO [facility]/ [phone]/ PER V.O. DR. B. HOXWORTH/ PT NOTIFIED/ PT ALSO INSTRUCTED TO GO TO EMERGENCY DEPT IF REDNESS INCREASES OR TEMPERATURE DOES NOT RESPOND TO ANTIBIOTIC/GY

## 2011-12-25 NOTE — Telephone Encounter (Signed)
MS Bauder CALLED TO REPORT BREAST REDNESS NOTED THIS AM AND TEMP OF 101.1/ SHE WAS REQUESTING AN ANTIBIOTIC/ REVIEWED WITH DR. Ezzard Standing. HE HAS NOT SEEN HER SINCE 2010/ HE DID NOT FEEL COMFORTABLE CALLING IN AN ANTIBIOTIC WITHOUT HER BEING SEEN FIRST/ I ALSO REVIEWED THIS WITH DR. HOXWORTH . HE REVIEWED HIS OFFICE NOTES AND ORDERED DOXYCYCLINE TO BE CALLED INTO PHARMACY/G BID X 10DAYS/

## 2011-12-28 ENCOUNTER — Telehealth (INDEPENDENT_AMBULATORY_CARE_PROVIDER_SITE_OTHER): Payer: Self-pay | Admitting: General Surgery

## 2011-12-28 NOTE — Telephone Encounter (Signed)
Patient called status post being placed on antibiotics for left breast cellulitis. Patient states this has been her third episode of cellulitis in this breast in the past 6 months. She has a history of left breast cancer and she is very worried. She states her fever is getting better now. Breast is becoming less red and hot. She has been on antibiotics since Saturday. I offered an appt in urgent office today to check the breast and make sure there is not an abscess that needs drained. Patient denied appt. She wants to speak with Dr Johna Sheriff or Dr Ezzard Standing re: these breast infections and her breast cancer. She is very nervous. Please advise.

## 2011-12-30 ENCOUNTER — Telehealth (INDEPENDENT_AMBULATORY_CARE_PROVIDER_SITE_OTHER): Payer: Self-pay | Admitting: Surgery

## 2011-12-30 ENCOUNTER — Ambulatory Visit (INDEPENDENT_AMBULATORY_CARE_PROVIDER_SITE_OTHER): Payer: Medicare Other | Admitting: Surgery

## 2011-12-30 ENCOUNTER — Encounter (INDEPENDENT_AMBULATORY_CARE_PROVIDER_SITE_OTHER): Payer: Self-pay | Admitting: Surgery

## 2011-12-30 VITALS — BP 138/92 | HR 72 | Temp 97.4°F | Resp 24 | Ht 64.5 in | Wt 195.0 lb

## 2011-12-30 DIAGNOSIS — N61 Mastitis without abscess: Secondary | ICD-10-CM

## 2011-12-30 MED ORDER — NYSTATIN 100000 UNIT/ML MT SUSP: 500000.0000 [IU] | Freq: Four times a day (QID) | OROMUCOSAL | Status: AC

## 2011-12-30 NOTE — Telephone Encounter (Signed)
I scheduled the pt for 2/22 and left a message for the patient.

## 2011-12-30 NOTE — Patient Instructions (Signed)
Finish antibiotics.  Nystatin for mouth.  Return to see Dr Ezzard Standing in 2 weeks.

## 2011-12-30 NOTE — Progress Notes (Signed)
Subjective:     Patient ID: Cassandra Johnston, female   DOB: 21-Jul-1951, 61 y.o.   MRN: [id number]  HPI The patient presents to redness and swelling of her left breast. This started 5 days ago. She has a history of left breast lumpectomy with postoperative day she therapy for breast cancer in 2010. Dr. Ezzard Standing is her breast surgeon. She was placed on doxycycline and she has less redness and less pain. This has happened on 2 previous occasions over the last 12 months.   Review of Systems  Constitutional: Negative for fever and fatigue.  Skin: Positive for color change.  Neurological: Negative.   Hematological: Negative.   Psychiatric/Behavioral: Negative.        Objective:   Physical Exam  Constitutional: She appears well-developed and well-nourished.  HENT:  Head: Normocephalic and atraumatic.  Eyes: EOM are normal. Pupils are equal, round, and reactive to light.  Neck: Normal range of motion.  Skin:          Assessment:     Mastitis left breast with history of left breast cancer status post breast conserving surgery and postoperative radiation therapy.    Plan:     Continue doxycycline. Return in 2 weeks to see Dr. Ezzard Standing for followup. Clinically she is better.

## 2012-01-22 ENCOUNTER — Encounter (INDEPENDENT_AMBULATORY_CARE_PROVIDER_SITE_OTHER): Payer: Medicare Other | Admitting: Surgery

## 2012-01-27 ENCOUNTER — Ambulatory Visit: Payer: Medicare Other | Admitting: Physician Assistant

## 2012-01-27 ENCOUNTER — Other Ambulatory Visit: Payer: Medicare Other | Admitting: Lab

## 2012-02-03 ENCOUNTER — Encounter (INDEPENDENT_AMBULATORY_CARE_PROVIDER_SITE_OTHER): Payer: Medicare Other | Admitting: Surgery

## 2012-02-10 ENCOUNTER — Telehealth (INDEPENDENT_AMBULATORY_CARE_PROVIDER_SITE_OTHER): Payer: Self-pay | Admitting: Surgery

## 2012-02-10 ENCOUNTER — Other Ambulatory Visit (HOSPITAL_BASED_OUTPATIENT_CLINIC_OR_DEPARTMENT_OTHER): Payer: Medicare Other | Admitting: Lab

## 2012-02-10 ENCOUNTER — Encounter: Payer: Self-pay | Admitting: Physician Assistant

## 2012-02-10 ENCOUNTER — Telehealth: Payer: Self-pay | Admitting: *Deleted

## 2012-02-10 ENCOUNTER — Ambulatory Visit (HOSPITAL_BASED_OUTPATIENT_CLINIC_OR_DEPARTMENT_OTHER): Payer: Medicare Other | Admitting: Physician Assistant

## 2012-02-10 VITALS — BP 149/80 | HR 79 | Temp 98.1°F | Ht 64.5 in | Wt 198.2 lb

## 2012-02-10 DIAGNOSIS — C50919 Malignant neoplasm of unspecified site of unspecified female breast: Secondary | ICD-10-CM

## 2012-02-10 DIAGNOSIS — M810 Age-related osteoporosis without current pathological fracture: Secondary | ICD-10-CM

## 2012-02-10 DIAGNOSIS — N644 Mastodynia: Secondary | ICD-10-CM

## 2012-02-10 DIAGNOSIS — Z170 Estrogen receptor positive status [ER+]: Secondary | ICD-10-CM

## 2012-02-10 DIAGNOSIS — N61 Mastitis without abscess: Secondary | ICD-10-CM

## 2012-02-10 DIAGNOSIS — C50912 Malignant neoplasm of unspecified site of left female breast: Secondary | ICD-10-CM

## 2012-02-10 DIAGNOSIS — C50419 Malignant neoplasm of upper-outer quadrant of unspecified female breast: Secondary | ICD-10-CM

## 2012-02-10 DIAGNOSIS — Z79810 Long term (current) use of selective estrogen receptor modulators (SERMs): Secondary | ICD-10-CM

## 2012-02-10 LAB — COMPREHENSIVE METABOLIC PANEL
ALT: 91 U/L — ABNORMAL HIGH (ref 0–35)
AST: 100 U/L — ABNORMAL HIGH (ref 0–37)
Alkaline Phosphatase: 143 U/L — ABNORMAL HIGH (ref 39–117)
Chloride: 98 mEq/L (ref 96–112)
Creatinine, Ser: 0.65 mg/dL (ref 0.50–1.10)
Total Bilirubin: 0.4 mg/dL (ref 0.3–1.2)

## 2012-02-10 LAB — CBC WITH DIFFERENTIAL/PLATELET
BASO%: 1.2 % (ref 0.0–2.0)
EOS%: 2.8 % (ref 0.0–7.0)
LYMPH%: 35.4 % (ref 14.0–49.7)
MCH: 30.9 pg (ref 25.1–34.0)
MCHC: 33.5 g/dL (ref 31.5–36.0)
MONO#: 0.6 10*3/uL (ref 0.1–0.9)
MONO%: 7.2 % (ref 0.0–14.0)
Platelets: 192 10*3/uL (ref 145–400)
RBC: 4.2 10*6/uL (ref 3.70–5.45)
WBC: 8.8 10*3/uL (ref 3.9–10.3)

## 2012-02-10 LAB — CANCER ANTIGEN 27.29: CA 27.29: 26 U/mL (ref 0–39)

## 2012-02-10 MED ORDER — DOXYCYCLINE HYCLATE 100 MG PO TABS: 100.0000 mg | ORAL_TABLET | Freq: Every day | ORAL | Status: AC

## 2012-02-10 NOTE — Telephone Encounter (Signed)
This RN called pt to inform her of noted blood sugar per lab obtained today with reading of 426.  Cassandra Johnston states she is taking metformin and amaryl as prescribed. Blood sugars are monitored by Dr Benedetto Goad.  Plan is pt will recheck blood sugars at home and this RN will fax lab to Dr Benedetto Goad.

## 2012-02-10 NOTE — Progress Notes (Signed)
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 She underwent left lumpectomy and sentinel lymph node biopsy in February 2010 for a T1 B. N0, grade 1 invasive ductal carcinoma. Tumor was strongly ER and PR positive, HER-2/neu negative, with a low MIB-1.  Cassandra Johnston underwent radiation therapy which was completed in May of 2010. At that time she began on tamoxifen which she continues, 20 mg daily.  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 There's been no evidence, however, of lymphoma, either by films or clinically.  INTERVAL HISTORY: Cassandra Johnston returns today for routine six-month followup of her left breast carcinoma. Interval history is remarkable for 3 episodes of mastitis/cellulitis in the left breast since her appointment here in August.   The first episode was in early September, with signs of mastitis noted on her mammogram in August. She was started on doxycycline, which seemed to take care of the problem, but it recurred again in late December. Again it was treated with doxycycline, improved, then recurred again in January. Breast becomes extremely red, hot, and tender to touch. She typically begins to run fever, sometimes as high as 102, but this all resolves quickly once she started on doxycycline. Each time, she has been evaluated by one of her surgeons at [facility].  Today in fact she tells me her breast is beginning to feel sore, and is beginning to look red. She has been off of the doxycycline since early February.  Otherwise, Cassandra Johnston recently had a fall when she tripped over her son's dogs, and she thinks she has pulled a muscle in her back. She has been seen by Dr. Channing Mutters in the past for degenerative disc disease and in fact has received epidural injections as recently as September 2011. The  pain comes and goes, and is certainly worse with certain movements. She has no radiating pain, no numbness, no weakness in the legs.  Cassandra Johnston continues on tamoxifen which she is tolerating well. She does have hot flashes and vaginal dryness, but this has not changed since her last appointment here, and thus far has been tolerable. She has had no change in vision. No peripheral swelling, and no evidence of abnormal clotting.   REVIEW OF SYSTEMS: Otherwise, Cassandra Johnston's energy level is good. She's had no nausea or emesis, and no change in bowel habits. No signs of abnormal bleeding. No shortness of breath. No abnormal headaches, and no new or unusual pains elsewhere other than the back as noted above.  A detailed review of systems is otherwise unremarkable.  PAST MEDICAL HISTORY: Past Medical History  Diagnosis Date  . Breast cancer 2010    left  . Diabetes mellitus   . Breast infection     left breast    PAST SURGICAL HISTORY: Past Surgical History  Procedure Date  . Breast lumpectomy 2010  . Abdominal hysterectomy   . Cholecystectomy   . Appendectomy   . Tonsillectomy   . Tubal ligation   . Fracture surgery     feet  . Colonoscopy   . Hand surgery   . Cystectomy     on thyroid    FAMILY HISTORY Family History  Problem Relation Age of Onset  . Cancer Mother     colon  . Diabetes Mother    GYNECOLOGIC HISTORY:  She is GX  P2, first pregnancy to term age 40.  She had her hysterectomy at age 50.  She never took hormones.  She started having hot flashes about 5 or 6 years ago.  SOCIAL HISTORY:  She previously worked for [employer].  Her son, Cassandra Johnston, works in the ER currently, is married to Cassandra Johnston, who also works there.  The patient's father, Cassandra Johnston, used to work in the ER as well.  The patient's daughter, Cassandra Johnston, works at [employer].  The patient lives at home with her husband of 25 years, Cassandra Johnston, and keeps her daughter, Cassandra Johnston, 3 grandchildren who are 1, 8,  and 6.  She attends [employer].    ADVANCED DIRECTIVES:  HEALTH MAINTENANCE: History  Substance Use Topics  . Smoking status: Former Smoker    Quit date: 12/01/2003  . Smokeless tobacco: Not on file  . Alcohol Use: No     Colonoscopy:  PAP:  Bone density:  Lipid panel:  No Known Allergies  Current Outpatient Prescriptions  Medication Sig Dispense Refill  . carbamazepine (TEGRETOL) 200 MG tablet Take 200 mg by mouth 2 (two) times daily.      . citalopram (CELEXA) 40 MG tablet       . Famotidine (PEPCID PO) Take 50 mg by mouth 2 (two) times daily.        Marland Kitchen glimepiride (AMARYL) 4 MG tablet Take 4 mg by mouth daily before breakfast.        . metFORMIN (GLUCOPHAGE) 500 MG tablet Take 500 mg by mouth 2 (two) times daily with a meal.        . TAMOXIFEN CITRATE PO Take 1 tablet by mouth daily.        Marland Kitchen doxycycline (VIBRA-TABS) 100 MG tablet Take 1 tablet (100 mg total) by mouth daily.  30 tablet  1    OBJECTIVE: Filed Vitals:   02/10/12 1338  BP: 149/80  Pulse: 79  Temp: 98.1 F (36.7 C)     Body mass index is 33.50 kg/(m^2).    ECOG FS: 0  Physical Exam: HEENT:  Sclerae anicteric, conjunctivae pink.  Oropharynx clear.  No mucositis or candidiasis.   Nodes:  No cervical, supraclavicular, or axillary lymphadenopathy palpated.  Breast Exam:  Right breast is unremarkable, no masses, skin changes, or nipple inversion. Left breast is status post lumpectomy. There is diffuse erythema throughout the breast itself, slightly warm to the touch, and blanching. No lesions or rashes noted. No suspicious nodularities. He Lungs:  Clear to auscultation bilaterally.  No crackles, rhonchi, or wheezes.   Heart:  Regular rate and rhythm.   Abdomen:  Soft, obese, nontender.  Positive bowel sounds.  No organomegaly or masses palpated.   Musculoskeletal:  No focal spinal tenderness to palpation.  Extremities:  Benign.  No peripheral edema or cyanosis.   Skin:  Benign.   Neuro:   Nonfocal. Alert and oriented x3. His   LAB RESULTS: Lab Results  Component Value Date   WBC 8.8 02/10/2012   NEUTROABS 4.7 02/10/2012   HGB 13.0 02/10/2012   HCT 38.7 02/10/2012   MCV 92.2 02/10/2012   PLT 192 02/10/2012      Chemistry      Component Value Date/Time   NA 134* 02/10/2012 1320   K 4.2 02/10/2012 1320   CL 98 02/10/2012 1320   CO2 27 02/10/2012 1320   BUN 12 02/10/2012 1320   CREATININE 0.65 02/10/2012 1320      Component Value Date/Time   CALCIUM  9.6 02/10/2012 1320   ALKPHOS 143* 02/10/2012 1320   AST 100* 02/10/2012 1320   ALT 91* 02/10/2012 1320   BILITOT 0.4 02/10/2012 1320       Lab Results  Component Value Date   LABCA2 26 02/10/2012      STUDIES:  07/16/2011 *RADIOLOGY REPORT*  Clinical Data: Patient presents for a bilateral diagnostic  mammogram due to a history of a prior left malignant lumpectomy  2010.  DIGITAL DIAGNOSTIC BILATERAL MAMMOGRAM WITH CAD  Comparison: 05/02/2010 and 12/28/2008  Findings: Exam demonstrates scattered fibroglandular densities.  There are stable post lumpectomy changes of the upper outer left  breast. Remainder of the exam is unchanged.  Mammographic images were processed with CAD.  IMPRESSION:  Stable post lumpectomy changes of the upper outer left breast.  Recommendations: Recommend continued annual bilateral diagnostic  mammographic evaluation.  BI-RADS CATEGORY 2: Benign finding(s).   08/05/2011 *RADIOLOGY REPORT*  Clinical Data: The patient developed marked erythema and warmth of  the entire left breast on 08/04/2011 associated with a fever of 102  degrees. The patient underwent left lumpectomy and radiation  therapy for breast cancer in 2010.  LEFT BREAST ULTRASOUND  Comparison: Mammograms dated 07/16/2011, 05/02/2010 and 12/17/2008  On physical exam, there is marked erythema of the skin of the  entire left breast.  Findings: Ultrasound is performed, showing marked skin thickening  and edema throughout the  left breast but no focal fluid collection  to suggest abscess.  IMPRESSION:  Findings are consistent with acute left mastitis. No abscess  identified. The patient will be seen in the [facility] urgent clinic this afternoon.  Next diagnostic mammogram is suggested in August 2013.  BI-RADS CATEGORY 2: Benign finding(s).  Original Report Authenticated By: Daryl Eastern, M.D.   ASSESSMENT: A 61 year old [place] woman  (1)   status post left lumpectomy and sentinel lymph node biopsy February 2010 for a T1b N0 grade 1 invasive ductal carcinoma, strongly estrogen and progesterone receptor positive, HER2 negative, with a low MIB-1.    (2)  After radiation therapy was completed in May 2010 she started tamoxifen which she continues.    (3)  She also had a sentinel lymph node showing atypical follicles with some BCL-2 positive cells showing a t(14; 18) translocation.  There has been no evidence of lymphoma, either by films or clinically.   (4)  multiple episodes of mastitis in the left breast.  PLAN: This case was reviewed with Dr. Darnelle Catalan was also examined the patient today. It is very unusual that Cassandra Johnston continues to have recurrent mastitis in the left breast. This does seem to be developing once again, Dr. Darnelle Catalan would like to evaluate further. We will initiate doxycycline, 100 mg daily for the next 6 weeks. We'll also refer her back for a diagnostic mammogram of the left breast followed by a breast MRI if possible.  Cassandra Johnston will see her surgeon in approximately 4 weeks, and will return to see [place] in 6 weeks as she completes her course of doxycycline. We'll discuss further treatment at that time.  Otherwise Cassandra Johnston seems to be doing quite well, and she will continue on her tamoxifen which has been refilled. She will call with any changes or problems prior to her next scheduled appointment.  Everitt Wenner    02/10/2012

## 2012-02-11 ENCOUNTER — Telehealth: Payer: Self-pay | Admitting: *Deleted

## 2012-02-11 NOTE — Telephone Encounter (Signed)
[facility] and stated that this patient has missed several appointments in the past because of the patient track they requested that we leave the patient appointment

## 2012-02-11 NOTE — Telephone Encounter (Signed)
I discussed this with Lawson Fiscal at Dr Darrall Dears.  We will leave the appt as is for now.

## 2012-02-12 ENCOUNTER — Telehealth: Payer: Self-pay | Admitting: *Deleted

## 2012-02-12 NOTE — Telephone Encounter (Signed)
made patient for dr.newman office for 04-07-2012 at 1:30pm made patient appointment for mammogram at the breast center on 02-18-2012 at 2:10 pm

## 2012-02-24 ENCOUNTER — Other Ambulatory Visit: Payer: Self-pay | Admitting: Physician Assistant

## 2012-02-24 ENCOUNTER — Ambulatory Visit
Admission: RE | Admit: 2012-02-24 | Discharge: 2012-02-24 | Disposition: A | Discharge status: Home / Self Care | Payer: Medicare Other | Source: Ambulatory Visit | Attending: Physician Assistant | Admitting: Physician Assistant

## 2012-02-24 DIAGNOSIS — N644 Mastodynia: Secondary | ICD-10-CM

## 2012-02-24 DIAGNOSIS — C50912 Malignant neoplasm of unspecified site of left female breast: Secondary | ICD-10-CM

## 2012-02-24 DIAGNOSIS — N61 Mastitis without abscess: Secondary | ICD-10-CM

## 2012-03-10 ENCOUNTER — Other Ambulatory Visit: Payer: Self-pay | Admitting: *Deleted

## 2012-03-10 DIAGNOSIS — Z853 Personal history of malignant neoplasm of breast: Secondary | ICD-10-CM

## 2012-03-21 ENCOUNTER — Ambulatory Visit
Admission: RE | Admit: 2012-03-21 | Discharge: 2012-03-21 | Disposition: A | Discharge status: Home / Self Care | Payer: Medicare Other | Source: Ambulatory Visit | Attending: Physician Assistant | Admitting: Physician Assistant

## 2012-03-21 ENCOUNTER — Ambulatory Visit
Admission: RE | Admit: 2012-03-21 | Discharge: 2012-03-21 | Disposition: A | Discharge status: Home / Self Care | Payer: Medicare Other | Source: Ambulatory Visit | Attending: Oncology | Admitting: Oncology

## 2012-03-21 DIAGNOSIS — C50912 Malignant neoplasm of unspecified site of left female breast: Secondary | ICD-10-CM

## 2012-03-21 DIAGNOSIS — Z853 Personal history of malignant neoplasm of breast: Secondary | ICD-10-CM

## 2012-03-21 DIAGNOSIS — N644 Mastodynia: Secondary | ICD-10-CM

## 2012-03-21 DIAGNOSIS — N61 Mastitis without abscess: Secondary | ICD-10-CM

## 2012-03-21 MED ORDER — GADOBENATE DIMEGLUMINE 529 MG/ML IV SOLN
19.0000 mL | Freq: Once | INTRAVENOUS | Status: AC | PRN
  Administered 2012-03-21: 19 mL via INTRAVENOUS

## 2012-03-22 ENCOUNTER — Other Ambulatory Visit: Payer: Self-pay | Admitting: Physician Assistant

## 2012-03-22 DIAGNOSIS — N61 Mastitis without abscess: Secondary | ICD-10-CM

## 2012-03-22 DIAGNOSIS — C50919 Malignant neoplasm of unspecified site of unspecified female breast: Secondary | ICD-10-CM

## 2012-03-23 ENCOUNTER — Other Ambulatory Visit: Payer: Medicare Other | Admitting: Lab

## 2012-03-23 ENCOUNTER — Ambulatory Visit (HOSPITAL_BASED_OUTPATIENT_CLINIC_OR_DEPARTMENT_OTHER): Payer: Medicare Other | Admitting: Physician Assistant

## 2012-03-23 ENCOUNTER — Encounter: Payer: Self-pay | Admitting: Physician Assistant

## 2012-03-23 ENCOUNTER — Telehealth: Payer: Self-pay | Admitting: Oncology

## 2012-03-23 VITALS — BP 132/74 | HR 68 | Temp 98.4°F | Ht 64.5 in | Wt 195.0 lb

## 2012-03-23 DIAGNOSIS — Z853 Personal history of malignant neoplasm of breast: Secondary | ICD-10-CM | POA: Insufficient documentation

## 2012-03-23 DIAGNOSIS — N61 Mastitis without abscess: Secondary | ICD-10-CM

## 2012-03-23 DIAGNOSIS — C50919 Malignant neoplasm of unspecified site of unspecified female breast: Secondary | ICD-10-CM

## 2012-03-23 LAB — CBC WITH DIFFERENTIAL/PLATELET
BASO%: 2.6 % — ABNORMAL HIGH (ref 0.0–2.0)
Basophils Absolute: 0.2 10*3/uL — ABNORMAL HIGH (ref 0.0–0.1)
EOS%: 3.1 % (ref 0.0–7.0)
HCT: 41.4 % (ref 34.8–46.6)
HGB: 13.7 g/dL (ref 11.6–15.9)
LYMPH%: 38 % (ref 14.0–49.7)
MCH: 30.9 pg (ref 25.1–34.0)
MCHC: 33.1 g/dL (ref 31.5–36.0)
MCV: 93.3 fL (ref 79.5–101.0)
NEUT%: 47.9 % (ref 38.4–76.8)
Platelets: 204 10*3/uL (ref 145–400)

## 2012-03-23 LAB — COMPREHENSIVE METABOLIC PANEL
AST: 76 U/L — ABNORMAL HIGH (ref 0–37)
Albumin: 4 g/dL (ref 3.5–5.2)
Alkaline Phosphatase: 146 U/L — ABNORMAL HIGH (ref 39–117)
BUN: 11 mg/dL (ref 6–23)
Calcium: 9.3 mg/dL (ref 8.4–10.5)
Creatinine, Ser: 0.69 mg/dL (ref 0.50–1.10)
Glucose, Bld: 336 mg/dL — ABNORMAL HIGH (ref 70–99)

## 2012-03-23 NOTE — Progress Notes (Signed)
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 She underwent left lumpectomy and sentinel lymph node biopsy in February 2010 for a T1 B. N0, grade 1 invasive ductal carcinoma. Tumor was strongly ER and PR positive, HER-2/neu negative, with a low MIB-1.  Cassandra Johnston underwent radiation therapy which was completed in May of 2010. At that time she began on tamoxifen which she continues, 20 mg daily.  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 There's been no evidence, however, of lymphoma, either by films or clinically.  INTERVAL HISTORY: Cassandra Johnston returns today for followup of her left breast carcinoma and recent cellulitis in the left breast. Recall that when seen here in March of 2013, the patient had 3 episodes of mastitis/cellulitis in the left breast since September 2012. She was started on doxycycline, 100 mg daily, on 02/11/2012. She continued the antibiotic for 6 weeks, and completed this approximately 7 days ago.  Cassandra Johnston denies any fevers or chills since her last appointment here. (Recall that at the beginning of each episode of cellulitis, her fever typically spiked as high as 102.) The breast has continued to be slightly red, warm, and tender to touch. This has not worsened, however, since her visit here in mid March. In fact she thinks it may have improved minimally.    Cassandra Johnston continues on tamoxifen which she is tolerating well. She does have hot flashes and vaginal dryness, but this has not changed since her last appointment here. She has had no change in vision. No peripheral swelling, and no evidence of abnormal clotting.  She continues to have back pain which is chronic. She has seen Dr. Channing Mutters in the past for degenerative disc disease, but "is not ready to go back to see him  yet".   REVIEW OF SYSTEMS: Otherwise, Cassandra Johnston's energy level is fairly good. She's had no nausea or emesis, and no change in bowel habits. No signs of abnormal bleeding. No  cough or shortness of breath. No abnormal headaches, and no new or unusual pains elsewhere other than the back as noted above.  A detailed review of systems is otherwise unremarkable.  PAST MEDICAL HISTORY: Past Medical History  Diagnosis Date  . Breast cancer 2010    left  . Diabetes mellitus   . Breast infection     left breast    PAST SURGICAL HISTORY: Past Surgical History  Procedure Date  . Breast lumpectomy 2010  . Abdominal hysterectomy   . Cholecystectomy   . Appendectomy   . Tonsillectomy   . Tubal ligation   . Fracture surgery     feet  . Colonoscopy   . Hand surgery   . Cystectomy     on thyroid    FAMILY HISTORY Family History  Problem Relation Age of Onset  . Cancer Mother     colon  . Diabetes Mother    GYNECOLOGIC HISTORY:  She is GX P2, first pregnancy to term age 89.  She had her hysterectomy at age 25.  She never took hormones.  She started having hot flashes about 5 or 6 years ago.  SOCIAL HISTORY:  She previously worked for [employer].  Her son, Kalman Drape, works in the ER currently, is married to Al Pimple, who also works there.  The patient's father, Providence Lanius, used to work  in the ER as well.  The patient's daughter, Artist Pais, works at [employer].  The patient lives at home with her husband of 25 years, Maisie Fus, and keeps her daughter, Micheline Chapman, 3 grandchildren who are 82, 8, and 6.  She attends [employer].    ADVANCED DIRECTIVES:  HEALTH MAINTENANCE: History  Substance Use Topics  . Smoking status: Former Smoker    Quit date: 12/01/2003  . Smokeless tobacco: Not on file  . Alcohol Use: No     Colonoscopy:  PAP:  Bone density:  Lipid panel:  No Known Allergies  Current Outpatient Prescriptions  Medication Sig Dispense Refill  .  carbamazepine (TEGRETOL) 200 MG tablet Take 200 mg by mouth 2 (two) times daily.      . citalopram (CELEXA) 40 MG tablet       . Famotidine (PEPCID PO) Take 50 mg by mouth 2 (two) times daily.        Marland Kitchen glimepiride (AMARYL) 4 MG tablet Take 4 mg by mouth daily before breakfast.        . metFORMIN (GLUCOPHAGE) 500 MG tablet Take 500 mg by mouth 2 (two) times daily with a meal.        . TAMOXIFEN CITRATE PO Take 1 tablet by mouth daily.          OBJECTIVE: Filed Vitals:   03/23/12 1318  BP: 132/74  Pulse: 68  Temp: 98.4 F (36.9 C)     Body mass index is 32.95 kg/(m^2).    ECOG FS: 0  Physical Exam: HEENT:  Sclerae anicteric, conjunctivae pink.  Oropharynx clear.  No mucositis or candidiasis.   Nodes:  No cervical, supraclavicular, or axillary lymphadenopathy palpated.  Breast Exam:  Right breast is unremarkable, no masses, skin changes, or nipple inversion. Left breast is status post lumpectomy. There is diffuse erythema throughout the breast itself, blanching. Erythema is mild today, improved as compared to last exam in March. Slight tenderness to palpation. No lesions or rashes noted. No suspicious nodularities. No nipple inversion or nipple discharge. Lungs:  Clear to auscultation bilaterally.  No crackles, rhonchi, or wheezes.   Heart:  Regular rate and rhythm.   Abdomen:  Soft, obese, nontender.  Positive bowel sounds.  No organomegaly or masses palpated.   Musculoskeletal:  No focal spinal tenderness to palpation.  Extremities:  Benign.  No peripheral edema or cyanosis.   Skin:  Benign.   Neuro:  Nonfocal. Alert and oriented x3.    LAB RESULTS: Lab Results  Component Value Date   WBC 9.2 03/23/2012   NEUTROABS 4.4 03/23/2012   HGB 13.7 03/23/2012   HCT 41.4 03/23/2012   MCV 93.3 03/23/2012   PLT 204 03/23/2012      Chemistry      Component Value Date/Time   NA 134* 02/10/2012 1320   K 4.2 02/10/2012 1320   CL 98 02/10/2012 1320   CO2 27 02/10/2012 1320   BUN 12 02/10/2012  1320   CREATININE 0.65 02/10/2012 1320      Component Value Date/Time   CALCIUM 9.6 02/10/2012 1320   ALKPHOS 143* 02/10/2012 1320   AST 100* 02/10/2012 1320   ALT 91* 02/10/2012 1320   BILITOT 0.4 02/10/2012 1320       Lab Results  Component Value Date   LABCA2 26 02/10/2012      STUDIES:  03/21/2012 *RADIOLOGY REPORT*  Clinical Data: History of malignant lumpectomy of the left breast  in February 2010. On recent diagnostic left breast mammogram  on  02/24/2012. Right breast mammogram requested to correlate with  breast MRI.  DIGITAL DIAGNOSTIC RIGHT BREAST MAMMOGRAM WITH CAD  Comparison: 07/16/2011, 05/02/2010, 12/28/2008.  Findings: There is a scattered fibroglandular pattern present  which is stable. There is a tissue marker located within the  medial portion of the right breast related to a previous image  guided core biopsy. There is no mass, distortion, or worrisome  calcification within the right breast.  Mammographic images were processed with CAD.  IMPRESSION:  Stable right breast parenchymal pattern. No findings worrisome for  developing right breast malignancy.  BI-RADS CATEGORY 1: Negative.  Original Report Authenticated By: Rolla Plate, M.D.       03/21/2012 *RADIOLOGY REPORT*  Clinical Data: History of mastitis or cellulitis involving the left  breast since August 2012. History of left breast cancer in 2010  with lumpectomy and radiation therapy.  BILATERAL BREAST MRI WITH AND WITHOUT CONTRAST  Technique: Multiplanar, multisequence MR images of both breasts  were obtained prior to and following the intravenous administration  of 19ml of multihance. Three dimensional images were evaluated at  the independent DynaCad workstation.  Comparison: Mammogram dated 02/24/2012 and 07/16/2011. Prior MRI  dated 01/08/2009.  Findings: There are scattered fibroglandular densities. Lumpectomy  changes are seen in the upper outer quadrant of the left breast.   There is no abnormal enhancement in either breast. There is no  enlarged axillary or internal mammary adenopathy.  IMPRESSION:  Lumpectomy changes in the left breast. No abnormal enhancement.  Diagnostic mammogram in March 2014 is recommended.  THREE-DIMENSIONAL MR IMAGE RENDERING ON INDEPENDENT WORKSTATION:  Three-dimensional MR images were rendered by post-processing of the  original MR data on an independent workstation. The three-  dimensional MR images were interpreted, and findings were reported  in the accompanying complete MRI report for this study.  BI-RADS CATEGORY 2: Benign finding(s).  Original Report Authenticated By: Littie Deeds. ARCEO, M.D.    ASSESSMENT: A 61 year old [place] woman  (1)   status post left lumpectomy and sentinel lymph node biopsy February 2010 for a T1b N0 grade 1 invasive ductal carcinoma, strongly estrogen and progesterone receptor positive, HER2 negative, with a low MIB-1.    (2)  After radiation therapy was completed in May 2010 she started tamoxifen which she continues.    (3)  She also had a sentinel lymph node showing atypical follicles with some BCL-2 positive cells showing a t(14; 18) translocation.  There has been no evidence of lymphoma, either by films or clinically.   (4)  multiple episodes of mastitis in the left breast.  PLAN: This case was reviewed with Dr. Darnelle Catalan.  It is promising that since discontinuing the doxycycline one week ago, or redness in the breast has not worsened, and the patient has not developed a fever. She will continue to follow this very closely. She is scheduled to see her surgeon, Dr. Ezzard Standing, in 2 weeks and I encouraged her to keep that appointment, even if the breast is improving.  Should she notice any increased redness, warmth, pain, or additional changes in the left breast, I asked the patient to contact us immediately and she voices her understanding. We will see her back in approximately 8-10 weeks for  followup. If this issue seems to have resolved at that time, we will resume are normal six-month followups.   Rolando Whitby    03/23/2012

## 2012-03-23 NOTE — Telephone Encounter (Signed)
gve the pt her June,july 2013 appt calendar 

## 2012-04-06 ENCOUNTER — Encounter (INDEPENDENT_AMBULATORY_CARE_PROVIDER_SITE_OTHER): Payer: Medicare Other | Admitting: Surgery

## 2012-04-07 ENCOUNTER — Encounter (INDEPENDENT_AMBULATORY_CARE_PROVIDER_SITE_OTHER): Payer: Medicare Other | Admitting: Surgery

## 2012-05-24 ENCOUNTER — Other Ambulatory Visit: Payer: Medicare Other | Admitting: Lab

## 2012-05-31 ENCOUNTER — Ambulatory Visit: Payer: Medicare Other | Admitting: Oncology

## 2012-06-09 ENCOUNTER — Encounter (INDEPENDENT_AMBULATORY_CARE_PROVIDER_SITE_OTHER): Payer: Medicare Other | Admitting: Surgery

## 2012-06-14 ENCOUNTER — Encounter (INDEPENDENT_AMBULATORY_CARE_PROVIDER_SITE_OTHER): Payer: Self-pay | Admitting: Surgery

## 2012-07-14 ENCOUNTER — Other Ambulatory Visit: Payer: Self-pay | Admitting: Oncology

## 2012-07-19 ENCOUNTER — Other Ambulatory Visit: Payer: Medicare Other | Admitting: Lab

## 2012-07-28 ENCOUNTER — Ambulatory Visit: Payer: Medicare Other | Admitting: Oncology

## 2012-08-02 ENCOUNTER — Other Ambulatory Visit: Payer: Self-pay | Admitting: Oncology

## 2012-08-02 DIAGNOSIS — N61 Mastitis without abscess: Secondary | ICD-10-CM

## 2012-08-02 DIAGNOSIS — Z853 Personal history of malignant neoplasm of breast: Secondary | ICD-10-CM

## 2012-08-02 NOTE — Addendum Note (Signed)
Addended by: Augusto Garbe on: 08/02/2012 04:43 PM   Modules accepted: Orders

## 2012-08-23 ENCOUNTER — Other Ambulatory Visit: Payer: Self-pay | Admitting: Physician Assistant

## 2012-08-30 ENCOUNTER — Other Ambulatory Visit: Payer: Self-pay | Admitting: Physician Assistant

## 2012-09-13 ENCOUNTER — Other Ambulatory Visit: Payer: Self-pay | Admitting: *Deleted

## 2012-09-13 ENCOUNTER — Telehealth: Payer: Self-pay | Admitting: Oncology

## 2012-09-13 MED ORDER — TAMOXIFEN CITRATE 20 MG PO TABS: 20.0000 mg | ORAL_TABLET | Freq: Every day | ORAL | Status: DC

## 2012-09-13 NOTE — Telephone Encounter (Signed)
Pt called to r/s her July appts that she missed,. Pt also needed refills on her tamoxifen. R/s the pt for the next avail appt date per val. Transferred the  Call ovef to val for the refill

## 2012-10-31 ENCOUNTER — Telehealth: Payer: Self-pay | Admitting: *Deleted

## 2012-10-31 NOTE — Telephone Encounter (Deleted)
ePPer rrrrr

## 2012-10-31 NOTE — Telephone Encounter (Signed)
Per patient voice mail message she needs to reschedule her lab/MD visit. I have transferred the message to the desk RN. JMW

## 2012-11-01 ENCOUNTER — Other Ambulatory Visit: Payer: Medicare Other | Admitting: Lab

## 2012-11-01 ENCOUNTER — Ambulatory Visit: Payer: Medicare Other | Admitting: Oncology

## 2012-11-08 ENCOUNTER — Other Ambulatory Visit: Payer: Self-pay

## 2012-11-08 ENCOUNTER — Ambulatory Visit: Payer: Self-pay | Admitting: Oncology

## 2012-11-16 ENCOUNTER — Telehealth (INDEPENDENT_AMBULATORY_CARE_PROVIDER_SITE_OTHER): Payer: Self-pay | Admitting: General Surgery

## 2012-11-16 ENCOUNTER — Ambulatory Visit (HOSPITAL_BASED_OUTPATIENT_CLINIC_OR_DEPARTMENT_OTHER): Payer: Medicare Other | Admitting: Oncology

## 2012-11-16 ENCOUNTER — Telehealth: Payer: Self-pay | Admitting: *Deleted

## 2012-11-16 ENCOUNTER — Other Ambulatory Visit (HOSPITAL_BASED_OUTPATIENT_CLINIC_OR_DEPARTMENT_OTHER): Payer: Medicare Other | Admitting: Lab

## 2012-11-16 VITALS — BP 133/81 | HR 64 | Temp 98.4°F | Resp 20 | Ht 64.5 in | Wt 196.1 lb

## 2012-11-16 DIAGNOSIS — N61 Mastitis without abscess: Secondary | ICD-10-CM

## 2012-11-16 DIAGNOSIS — C50919 Malignant neoplasm of unspecified site of unspecified female breast: Secondary | ICD-10-CM

## 2012-11-16 DIAGNOSIS — Z853 Personal history of malignant neoplasm of breast: Secondary | ICD-10-CM

## 2012-11-16 DIAGNOSIS — M810 Age-related osteoporosis without current pathological fracture: Secondary | ICD-10-CM

## 2012-11-16 DIAGNOSIS — R232 Flushing: Secondary | ICD-10-CM

## 2012-11-16 DIAGNOSIS — Z170 Estrogen receptor positive status [ER+]: Secondary | ICD-10-CM

## 2012-11-16 LAB — CBC WITH DIFFERENTIAL/PLATELET
BASO%: 1.6 % (ref 0.0–2.0)
HCT: 38.1 % (ref 34.8–46.6)
HGB: 12.9 g/dL (ref 11.6–15.9)
LYMPH%: 37.8 % (ref 14.0–49.7)
MCH: 31.6 pg (ref 25.1–34.0)
MCV: 93.4 fL (ref 79.5–101.0)
MONO#: 0.7 10*3/uL (ref 0.1–0.9)
MONO%: 8.2 % (ref 0.0–14.0)
NEUT#: 4.2 10*3/uL (ref 1.5–6.5)

## 2012-11-16 LAB — CANCER ANTIGEN 27.29: CA 27.29: 22 U/mL (ref 0–39)

## 2012-11-16 LAB — COMPREHENSIVE METABOLIC PANEL (CC13)
ALT: 74 U/L — ABNORMAL HIGH (ref 0–55)
AST: 76 U/L — ABNORMAL HIGH (ref 5–34)
Alkaline Phosphatase: 128 U/L (ref 40–150)
Creatinine: 0.8 mg/dL (ref 0.6–1.1)
Total Bilirubin: 0.86 mg/dL (ref 0.20–1.20)

## 2012-11-16 MED ORDER — VENLAFAXINE HCL ER 37.5 MG PO CP24: 37.5000 mg | ORAL_CAPSULE | Freq: Every day | ORAL | Status: DC

## 2012-11-16 NOTE — Telephone Encounter (Signed)
Gave patient appointment for 08-2013  [facility] office and they will be placing on the re-call list

## 2012-11-16 NOTE — Progress Notes (Signed)
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 She underwent left lumpectomy and sentinel lymph node biopsy in February 2010 for a T1 B. N0, grade 1 invasive ductal carcinoma. Tumor was strongly ER and PR positive, HER-2/neu negative, with a low MIB-1.  Cassandra Johnston underwent radiation therapy which was completed in May of 2010. At that time she began on tamoxifen which she continues, 20 mg daily.  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 There's been no evidence, however, of lymphoma, either by films or clinically.  INTERVAL HISTORY: Cassandra Johnston returns today for followup of her left breast carcinoma. She has not had any further cellulitis complications. She does find that her low back pain is worse. They have "adopted" a 52 year old widowed neighbor, currently living at home with them, and this is strange her back significantly.  REVIEW OF SYSTEMS: She is tolerating the tamoxifen with significant daytime hot flashes. She is taking frequent Naprosyn for the back pain and despite also taking Prilosec is developing GERD symptoms. She denies depression. She does feel anxious. A detailed review of systems today was otherwise entirely noncontributory  PAST MEDICAL HISTORY: Past Medical History  Diagnosis Date  . Breast cancer 2010    left  . Diabetes mellitus   . Breast infection     left breast    PAST SURGICAL HISTORY: Past Surgical History  Procedure Date  . Breast lumpectomy 2010  . Abdominal hysterectomy   . Cholecystectomy   . Appendectomy   . Tonsillectomy   . Tubal ligation   . Fracture surgery     feet  . Colonoscopy   . Hand surgery   . Cystectomy     on thyroid    FAMILY HISTORY Family History  Problem Relation Age of Onset  . Cancer Mother     colon  .  Diabetes Mother    GYNECOLOGIC HISTORY:  She is GX P2, first pregnancy to term age 35.  She had her hysterectomy at age 79.  She never took hormones.  She started having hot flashes about 5 or 6 years ago.  SOCIAL HISTORY: (updated Dec 2013)   She previously worked for [employer].  Her son, Kalman Drape, works in the ER, is married to Al Pimple, who also works there.  The patient's father, Providence Lanius, used to work in the ER as well.  The patient's daughter, Artist Pais, works at [employer].  The patient lives at home with her husband Maisie Fus, and keeps 2 of her daughter, Micheline Chapman, 3 children who are 10 and 16.  She attends [employer].    ADVANCED DIRECTIVES:  HEALTH MAINTENANCE: History  Substance Use Topics  . Smoking status: Former Smoker    Quit date: 12/01/2003  . Smokeless tobacco: Not on file  . Alcohol Use: No     Colonoscopy:  PAP: s/p hysterectomy  Bone density: 2010/ osteoporosis  Lipid panel:  No Known Allergies  Current Outpatient Prescriptions  Medication Sig Dispense Refill  . citalopram (CELEXA) 40 MG tablet       . Famotidine (PEPCID PO) Take 50 mg by mouth 2 (two) times daily.        Marland Kitchen glimepiride (AMARYL) 4 MG tablet Take 4 mg by mouth daily before breakfast.        . metFORMIN (GLUCOPHAGE) 500  MG tablet Take 500 mg by mouth 2 (two) times daily with a meal.        . tamoxifen (NOLVADEX) 20 MG tablet Take 1 tablet (20 mg total) by mouth daily.  30 tablet  12  . venlafaxine XR (EFFEXOR-XR) 37.5 MG 24 hr capsule Take 1 capsule (37.5 mg total) by mouth daily.  30 capsule  12    OBJECTIVE: Middle aged white woman who appears somewhat anxious Filed Vitals:   11/16/12 0912  BP: 133/81  Pulse: 64  Temp: 98.4 F (36.9 C)  Resp: 20     Body mass index is 33.14 kg/(m^2).    ECOG FS: 1  Sclerae unicteric Oropharynx clear Mild rosacea No cervical or supraclavicular adenopathy Lungs no rales or rhonchi Heart regular rate and rhythm Abd  benign MSK no peripheral edema Neuro: nonfocal Breasts: The right breast is unremarkable. The left breast is status post lumpectomy and radiation. There is no evidence of local recurrence. The left axilla is benign.  LAB RESULTS: Lab Results  Component Value Date   WBC 8.5 11/16/2012   NEUTROABS 4.2 11/16/2012   HGB 12.9 11/16/2012   HCT 38.1 11/16/2012   MCV 93.4 11/16/2012   PLT 159 11/16/2012      Chemistry      Component Value Date/Time   NA 138 11/16/2012 0838   NA 136 03/23/2012 1309   K 3.9 11/16/2012 0838   K 3.9 03/23/2012 1309   CL 103 11/16/2012 0838   CL 99 03/23/2012 1309   CO2 26 11/16/2012 0838   CO2 25 03/23/2012 1309   BUN 10.0 11/16/2012 0838   BUN 11 03/23/2012 1309   CREATININE 0.8 11/16/2012 0838   CREATININE 0.69 03/23/2012 1309      Component Value Date/Time   CALCIUM 9.1 11/16/2012 0838   CALCIUM 9.3 03/23/2012 1309   ALKPHOS 128 11/16/2012 0838   ALKPHOS 146* 03/23/2012 1309   AST 76* 11/16/2012 0838   AST 76* 03/23/2012 1309   ALT 74* 11/16/2012 0838   ALT 80* 03/23/2012 1309   BILITOT 0.86 11/16/2012 0838   BILITOT 0.5 03/23/2012 1309       Lab Results  Component Value Date   LABCA2 26 02/10/2012      STUDIES:  03/21/2012 *RADIOLOGY REPORT*  Clinical Data: History of malignant lumpectomy of the left breast  in February 2010. On recent diagnostic left breast mammogram on  02/24/2012. Right breast mammogram requested to correlate with  breast MRI.  DIGITAL DIAGNOSTIC RIGHT BREAST MAMMOGRAM WITH CAD  Comparison: 07/16/2011, 05/02/2010, 12/28/2008.  Findings: There is a scattered fibroglandular pattern present  which is stable. There is a tissue marker located within the  medial portion of the right breast related to a previous image  guided core biopsy. There is no mass, distortion, or worrisome  calcification within the right breast.  Mammographic images were processed with CAD.  IMPRESSION:  Stable right breast parenchymal pattern.  No findings worrisome for  developing right breast malignancy.  BI-RADS CATEGORY 1: Negative.  Original Report Authenticated By: Rolla Plate, M.D.       03/21/2012 *RADIOLOGY REPORT*  Clinical Data: History of mastitis or cellulitis involving the left  breast since August 2012. History of left breast cancer in 2010  with lumpectomy and radiation therapy.  BILATERAL BREAST MRI WITH AND WITHOUT CONTRAST  Technique: Multiplanar, multisequence MR images of both breasts  were obtained prior to and following the intravenous administration  of 19ml of multihance. Three dimensional images  were evaluated at  the independent DynaCad workstation.  Comparison: Mammogram dated 02/24/2012 and 07/16/2011. Prior MRI  dated 01/08/2009.  Findings: There are scattered fibroglandular densities. Lumpectomy  changes are seen in the upper outer quadrant of the left breast.  There is no abnormal enhancement in either breast. There is no  enlarged axillary or internal mammary adenopathy.  IMPRESSION:  Lumpectomy changes in the left breast. No abnormal enhancement.  Diagnostic mammogram in March 2014 is recommended.  THREE-DIMENSIONAL MR IMAGE RENDERING ON INDEPENDENT WORKSTATION:  Three-dimensional MR images were rendered by post-processing of the  original MR data on an independent workstation. The three-  dimensional MR images were interpreted, and findings were reported  in the accompanying complete MRI report for this study.  BI-RADS CATEGORY 2: Benign finding(s).  Original Report Authenticated By: Littie Deeds. ARCEO, M.D.    ASSESSMENT: A 61 year old [place] woman  (1)   status post left lumpectomy and sentinel lymph node biopsy February 2010 for a T1b N0, stage IA invasive ductal carcinoma,  grade 1, strongly estrogen and progesterone receptor positive, HER2 negative, with a low MIB-1.    (2)  After radiation therapy was completed in May 2010 she started tamoxifen on which she continues.     (3)  She also had a sentinel lymph node showing atypical follicles with some BCL-2 positive cells showing a t(14; 18) translocation.  There has been no evidence of lymphoma, either by films or clinically.   (4)  multiple episodes of mastitis in the left breast.  (5) osteoporosis  PLAN: The patient is doing well from a breast cancer point of view, and the overall plan is to complete 5 years of tamoxifen which will take her to may 2015. She will see Dr. Ezzard Standing late April 2014 after her mammogram, and she will see [place] again in October of next year. She will then see me one last time may of 2015.  Because of her hot flashes I am starting her on venlafaxine it are 37.5 mg. If she doesn't find any improvement after 2 weeks she will let us know and we will double the dose. As far as your back pain is concerned I have suggested she contact her primary care physician, Dr. Andrey Campanile, for further evaluation. She knows to call for any problems that may develop before the next visit. Jairy Angulo C    11/16/2012

## 2013-01-19 ENCOUNTER — Other Ambulatory Visit: Payer: Medicare Other | Admitting: Lab

## 2013-01-26 ENCOUNTER — Ambulatory Visit: Payer: Medicare Other | Admitting: Physician Assistant

## 2013-05-10 ENCOUNTER — Other Ambulatory Visit: Payer: Self-pay | Admitting: Oncology

## 2013-05-10 DIAGNOSIS — Z853 Personal history of malignant neoplasm of breast: Secondary | ICD-10-CM

## 2013-08-03 ENCOUNTER — Other Ambulatory Visit: Payer: Self-pay | Admitting: Oncology

## 2013-08-03 ENCOUNTER — Ambulatory Visit
Admission: RE | Admit: 2013-08-03 | Discharge: 2013-08-03 | Disposition: A | Discharge status: Home / Self Care | Payer: Medicare Other | Source: Ambulatory Visit | Attending: Oncology | Admitting: Oncology

## 2013-08-03 DIAGNOSIS — Z853 Personal history of malignant neoplasm of breast: Secondary | ICD-10-CM

## 2013-09-01 ENCOUNTER — Ambulatory Visit (INDEPENDENT_AMBULATORY_CARE_PROVIDER_SITE_OTHER): Payer: Medicare Other | Admitting: Endocrinology

## 2013-09-01 ENCOUNTER — Encounter: Payer: Self-pay | Admitting: Endocrinology

## 2013-09-01 VITALS — BP 126/78 | HR 74 | Ht 65.0 in | Wt 195.0 lb

## 2013-09-01 DIAGNOSIS — E119 Type 2 diabetes mellitus without complications: Secondary | ICD-10-CM

## 2013-09-01 NOTE — Patient Instructions (Signed)
good diet and exercise habits significanly improve the control of your diabetes.  please let me know if you wish to be referred to a dietician.  high blood sugar is very risky to your health.  you should see an eye doctor every year.  You are at higher than average risk for pneumonia and hepatitis-B.  You should be vaccinated against both.   controlling your blood pressure and cholesterol drastically reduces the damage diabetes does to your body.  this also applies to quitting smoking.  please discuss these with your doctor.  check your blood sugar once a day.  vary the time of day when you check, between before the 3 meals, and at bedtime.  also check if you have symptoms of your blood sugar being too high or too low.  please keep a record of the readings and bring it to your next appointment here.  please call us sooner if your blood sugar goes below 70, or if you have a lot of readings over 200. blood tests are being requested for you today.  We'll contact you with results. If it is high, we'll add "bromocriptine." It has possible side effects of nausea and dizziness.  These go away with time.  You can avoid these by taking it at bedtime, and by taking just take 1/2 pill for the first week.   Please come back for a follow-up appointment in January.

## 2013-09-01 NOTE — Progress Notes (Signed)
Subjective:    Patient ID: Cassandra Johnston, female    DOB: May 02, 1951, 62 y.o.   MRN: [id number]  HPI pt states DM was dx'ed in 2008; she has mild if any neuropathy of the lower extremities; he is unaware of any associated chronic complications.  he has never been on insulin.  pt says his diet and exercise are very good. She says she cannot afford expensive name-brand medications.  Pt says her cbg's are in the 200's.  Past Medical History  Diagnosis Date  . Breast cancer 2010    left  . Diabetes mellitus   . Breast infection     left breast    Past Surgical History  Procedure Laterality Date  . Breast lumpectomy  2010  . Abdominal hysterectomy    . Cholecystectomy    . Appendectomy    . Tonsillectomy    . Tubal ligation    . Fracture surgery      feet  . Colonoscopy    . Hand surgery    . Cystectomy      on thyroid    History   Social History  . Marital Status: Married    Spouse Name: N/A    Number of Children: N/A  . Years of Education: N/A   Occupational History  . Not on file.   Social History Main Topics  . Smoking status: Former Smoker    Quit date: 12/01/2003  . Smokeless tobacco: Not on file  . Alcohol Use: No  . Drug Use: No  . Sexual Activity:    Other Topics Concern  . Not on file   Social History Narrative  . No narrative on file    Current Outpatient Prescriptions on File Prior to Visit  Medication Sig Dispense Refill  . citalopram (CELEXA) 40 MG tablet       . Famotidine (PEPCID PO) Take 50 mg by mouth 2 (two) times daily.        Marland Kitchen glimepiride (AMARYL) 4 MG tablet Take 4 mg by mouth daily before breakfast.        . metFORMIN (GLUCOPHAGE) 500 MG tablet Take 500 mg by mouth 2 (two) times daily with a meal.        . tamoxifen (NOLVADEX) 20 MG tablet Take 1 tablet (20 mg total) by mouth daily.  30 tablet  12  . venlafaxine XR (EFFEXOR-XR) 37.5 MG 24 hr capsule Take 1 capsule (37.5 mg total) by mouth daily.  30 capsule  12   No current  facility-administered medications on file prior to visit.    No Known Allergies  Family History  Problem Relation Age of Onset  . Cancer Mother     colon  . Diabetes Mother    BP 126/78  Pulse 74  Ht 5\' 5"  (1.651 m)  Wt 195 lb (88.451 kg)  BMI 32.45 kg/m2  SpO2 94%  Review of Systems denies blurry vision, chest pain, sob, vomiting, memory loss, depression, hypoglycemia, rhinorrhea, and easy bruising.  She reports fatigue, hair loss, diarrhea, excessive diaphoresis, weight gain, headache, nausea, urinary frequency, leg cramps, and menopausal sxs.      Objective:   Physical Exam VS: see vs page GEN: no distress HEAD: head: no deformity eyes: no periorbital swelling, no proptosis external nose and ears are normal mouth: no lesion seen NECK: supple, thyroid is not enlarged CHEST WALL: no deformity LUNGS:  Clear to auscultation CV: reg rate and rhythm, no murmur ABD: abdomen is soft, nontender.  no  hepatosplenomegaly.  not distended.  no hernia MUSCULOSKELETAL: muscle bulk and strength are grossly normal.  no obvious joint swelling.  gait is normal and steady PULSES: no carotid bruit NEURO:  cn 2-12 grossly intact.   readily moves all 4's.   SKIN:  Normal texture and temperature.  No rash or suspicious lesion is visible.   NODES:  None palpable at the neck PSYCH: alert, oriented x3.  Does not appear anxious nor depressed.   Lab Results  Component Value Date   HGBA1C 9.3* 09/01/2013      Assessment & Plan:  DM: poor control: this causes very high risk to pt's health. Weight gain: this complicates the rx of DM. Financial circumstances: these complicates the rx of DM Diarrhea/IBS: we may have to decrease metformin, but we'll continue for now.

## 2013-09-02 ENCOUNTER — Encounter: Payer: Self-pay | Admitting: Endocrinology

## 2013-09-02 MED ORDER — BROMOCRIPTINE MESYLATE 2.5 MG PO TABS: 2.5000 mg | ORAL_TABLET | Freq: Every day | ORAL | Status: DC

## 2013-09-04 ENCOUNTER — Other Ambulatory Visit: Payer: Self-pay | Admitting: Endocrinology

## 2013-09-04 MED ORDER — INSULIN REGULAR HUMAN 100 UNIT/ML IJ SOLN: 5.0000 [IU] | Freq: Three times a day (TID) | INTRAMUSCULAR | Status: DC

## 2013-09-07 ENCOUNTER — Other Ambulatory Visit: Payer: Self-pay | Admitting: Physician Assistant

## 2013-09-07 ENCOUNTER — Encounter: Payer: Self-pay | Admitting: Endocrinology

## 2013-09-07 DIAGNOSIS — Z853 Personal history of malignant neoplasm of breast: Secondary | ICD-10-CM

## 2013-09-08 ENCOUNTER — Other Ambulatory Visit (HOSPITAL_BASED_OUTPATIENT_CLINIC_OR_DEPARTMENT_OTHER): Payer: Medicare Other | Admitting: Lab

## 2013-09-08 DIAGNOSIS — Z853 Personal history of malignant neoplasm of breast: Secondary | ICD-10-CM

## 2013-09-08 LAB — CBC WITH DIFFERENTIAL/PLATELET
BASO%: 1.6 % (ref 0.0–2.0)
Basophils Absolute: 0.2 10*3/uL — ABNORMAL HIGH (ref 0.0–0.1)
EOS%: 2.8 % (ref 0.0–7.0)
HCT: 40.9 % (ref 34.8–46.6)
HGB: 13.8 g/dL (ref 11.6–15.9)
LYMPH%: 40.5 % (ref 14.0–49.7)
MCH: 30.6 pg (ref 25.1–34.0)
MCHC: 33.8 g/dL (ref 31.5–36.0)
MONO#: 0.9 10*3/uL (ref 0.1–0.9)
NEUT%: 46.2 % (ref 38.4–76.8)
Platelets: 191 10*3/uL (ref 145–400)

## 2013-09-08 LAB — COMPREHENSIVE METABOLIC PANEL (CC13)
ALT: 78 U/L — ABNORMAL HIGH (ref 0–55)
Anion Gap: 10 mEq/L (ref 3–11)
BUN: 8.3 mg/dL (ref 7.0–26.0)
CO2: 26 mEq/L (ref 22–29)
Calcium: 9.9 mg/dL (ref 8.4–10.4)
Chloride: 102 mEq/L (ref 98–109)
Creatinine: 0.8 mg/dL (ref 0.6–1.1)
Total Bilirubin: 0.58 mg/dL (ref 0.20–1.20)

## 2013-09-15 ENCOUNTER — Encounter: Payer: Self-pay | Admitting: Physician Assistant

## 2013-09-15 ENCOUNTER — Ambulatory Visit (HOSPITAL_BASED_OUTPATIENT_CLINIC_OR_DEPARTMENT_OTHER): Payer: Medicare Other | Admitting: Lab

## 2013-09-15 ENCOUNTER — Ambulatory Visit (HOSPITAL_BASED_OUTPATIENT_CLINIC_OR_DEPARTMENT_OTHER): Payer: Medicare Other | Admitting: Physician Assistant

## 2013-09-15 ENCOUNTER — Telehealth: Payer: Self-pay | Admitting: Physician Assistant

## 2013-09-15 VITALS — BP 148/76 | HR 69 | Temp 98.1°F | Resp 18 | Ht 65.0 in | Wt 197.6 lb

## 2013-09-15 DIAGNOSIS — C50419 Malignant neoplasm of upper-outer quadrant of unspecified female breast: Secondary | ICD-10-CM

## 2013-09-15 DIAGNOSIS — R112 Nausea with vomiting, unspecified: Secondary | ICD-10-CM

## 2013-09-15 DIAGNOSIS — M810 Age-related osteoporosis without current pathological fracture: Secondary | ICD-10-CM

## 2013-09-15 DIAGNOSIS — R5381 Other malaise: Secondary | ICD-10-CM

## 2013-09-15 DIAGNOSIS — D72829 Elevated white blood cell count, unspecified: Secondary | ICD-10-CM

## 2013-09-15 DIAGNOSIS — E119 Type 2 diabetes mellitus without complications: Secondary | ICD-10-CM

## 2013-09-15 DIAGNOSIS — R509 Fever, unspecified: Secondary | ICD-10-CM

## 2013-09-15 DIAGNOSIS — Z170 Estrogen receptor positive status [ER+]: Secondary | ICD-10-CM

## 2013-09-15 DIAGNOSIS — Z853 Personal history of malignant neoplasm of breast: Secondary | ICD-10-CM

## 2013-09-15 LAB — CBC WITH DIFFERENTIAL/PLATELET
Basophils Absolute: 0.2 10*3/uL — ABNORMAL HIGH (ref 0.0–0.1)
Eosinophils Absolute: 0.3 10*3/uL (ref 0.0–0.5)
HCT: 42.9 % (ref 34.8–46.6)
HGB: 14 g/dL (ref 11.6–15.9)
MCH: 29.6 pg (ref 25.1–34.0)
MONO#: 0.8 10*3/uL (ref 0.1–0.9)
NEUT#: 5.8 10*3/uL (ref 1.5–6.5)
NEUT%: 50.2 % (ref 38.4–76.8)
RDW: 13.9 % (ref 11.2–14.5)
WBC: 11.5 10*3/uL — ABNORMAL HIGH (ref 3.9–10.3)
lymph#: 4.4 10*3/uL — ABNORMAL HIGH (ref 0.9–3.3)

## 2013-09-15 NOTE — Progress Notes (Signed)
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 Cassandra Johnston underwent left lumpectomy and sentinel lymph node biopsy in February 2010 for a T1 B. N0, grade 1 invasive ductal carcinoma. Tumor was strongly ER and PR positive, HER-2/neu negative, with a low MIB-1.  Cassandra Johnston underwent radiation therapy which was completed in May of 2010. At that time Cassandra Johnston began on tamoxifen, 20 mg daily.  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 There has been no evidence, however, of lymphoma, either by films or clinically.  Additional treatment history is as detailed below.   INTERVAL HISTORY: Cassandra Johnston returns today for followup of her left breast carcinoma. Fortunately, her mastitis finally cleared up, and Cassandra Johnston's had no additional infections. Cassandra Johnston discontinued her tamoxifen on her own in July. Cassandra Johnston looked in a mirror and noted Cassandra Johnston had a "huge bald spot" on the back of her head. Cassandra Johnston subsequently stopped the medication, and Cassandra Johnston does feel like her hair is growing back in. Cassandra Johnston is very hesitant to restart tamoxifen.Of note, her thyroid levels have been checked per her report and are normal.   Otherwise, Cassandra Johnston tells me Cassandra Johnston feels "generally bad" today. Cassandra Johnston feels very weak and fatigued. Cassandra Johnston often has difficulty sleeping. Cassandra Johnston runs low grade fevers in the 99 range on a regular basis. Cassandra Johnston complains of some hot flashes but denies any actual night sweats.  Cassandra Johnston's had no obvious skin changes or rashes, and has noted no enlarged lymph nodes or abnormal "lumps".   Cassandra Johnston's had no unexpected weight loss.   REVIEW OF SYSTEMS: Cassandra Johnston has had several episodes of nausea  with emesis. These episodes do not appear to be associated with anything in particular, for instance not associated with anything Cassandra Johnston has eaten. Cassandra Johnston's having regular bowel movements. Cassandra Johnston does note that Cassandra Johnston is due for her screening colonoscopy. Cassandra Johnston's had no abnormal bleeding. Cassandra Johnston continues to be seen and treated by Dr. Everardo All for uncontrolled diabetes. Cassandra Johnston has diffuse joint pain. Cassandra Johnston has headaches but denies dizziness or change in vision. Her voice is sometimes a little hoarse.  Cassandra Johnston denies any increased cough, shortness of breath, chest pains, or palpitations. Cassandra Johnston's had no peripheral swelling.  Cassandra Johnston denies any problems with depression or anxiety.  A detailed review of systems is otherwise stable and noncontributory.     PAST MEDICAL HISTORY: Past Medical History  Diagnosis Date  . Breast cancer 2010    left  . Diabetes mellitus   . Breast infection     left breast    PAST SURGICAL HISTORY: Past Surgical History  Procedure Laterality Date  . Breast lumpectomy  2010  . Abdominal hysterectomy    . Cholecystectomy    . Appendectomy    . Tonsillectomy    . Tubal ligation    . Fracture surgery      feet  . Colonoscopy    . Hand surgery    . Cystectomy      on thyroid    FAMILY HISTORY Family History  Problem Relation Age of Onset  . Cancer Mother     colon  . Diabetes Mother  GYNECOLOGIC HISTORY:   Cassandra Johnston is GX P2, first pregnancy to term age 35.  Cassandra Johnston had her hysterectomy at age 58.  Cassandra Johnston never took hormones.  Cassandra Johnston started having hot flashes about 5 or 6 years ago.  SOCIAL HISTORY: (updated Dec 2013)  Cassandra Johnston previously worked for [employer].  Her son, Kalman Drape, works in the ER, is married to Al Pimple, who also works there.  The patient's father, Providence Lanius, used to work in the ER as well.  The patient's daughter, Artist Pais, works at [employer].  The patient lives at home with her husband Maisie Fus, and keeps 2 of her daughter, Micheline Chapman, 3 children who are 10 and 16.  Cassandra Johnston attends  [employer].   ADVANCED DIRECTIVES:  HEALTH MAINTENANCE: (Updated 09/15/2013) History  Substance Use Topics  . Smoking status: Former Smoker    Quit date: 12/01/2003  . Smokeless tobacco: Never Used  . Alcohol Use: No     Colonoscopy: Dr. Marina Goodell, June 2010  PAP: s/p hysterectomy  Bone density: 2010/ osteoporosis  Lipid panel: UTD, Dr. Andrey Campanile   No Known Allergies  Current Outpatient Prescriptions  Medication Sig Dispense Refill  . bromocriptine (PARLODEL) 2.5 MG tablet Take 1 tablet (2.5 mg total) by mouth daily.  30 tablet  11  . citalopram (CELEXA) 40 MG tablet       . Famotidine (PEPCID PO) Take 50 mg by mouth 2 (two) times daily.        Marland Kitchen glimepiride (AMARYL) 4 MG tablet Take 4 mg by mouth daily before breakfast.        . insulin regular (HUMULIN R) 100 units/mL injection Inject 0.05 mLs (5 Units total) into the skin 3 (three) times daily before meals. And syringes 3/day  10 mL  12  . metFORMIN (GLUCOPHAGE) 500 MG tablet Take 500 mg by mouth 2 (two) times daily with a meal.        . tamoxifen (NOLVADEX) 20 MG tablet Take 1 tablet (20 mg total) by mouth daily.  30 tablet  12  . traMADol (ULTRAM) 50 MG tablet       . venlafaxine XR (EFFEXOR-XR) 37.5 MG 24 hr capsule Take 1 capsule (37.5 mg total) by mouth daily.  30 capsule  12   No current facility-administered medications for this visit.    OBJECTIVE: Middle aged white woman who appears  her stated age. Cassandra Johnston appears tired but is in no acute distress Filed Vitals:   09/15/13 1103  BP: 148/76  Pulse: 69  Temp: 98.1 F (36.7 C)  Resp: 18     Body mass index is 32.88 kg/(m^2).    ECOG FS: 1 Filed Weights   09/15/13 1103  Weight: 197 lb 9.6 oz (89.631 kg)   Physical Exam: HEENT:  Sclerae anicteric.  Oropharynx clear. Buccal mucosa is pink and moist. NODES:  No cervical or supraclavicular lymphadenopathy palpated. No inguinal lymphadenopathy. BREAST EXAM:  Right breast is unremarkable. Left breast is  status post lumpectomy with no suspicious skin changes or nodularity. No evidence of local recurrence. No erythema or warmth to suggest cellulitis today. Axillae are benign bilaterally with no palpable adenopathy. LUNGS:  Clear to auscultation bilaterally.  No wheezes or rhonchi HEART:  Regular rate and rhythm. No murmur  ABDOMEN:  Soft, obese, nontender.  Positive bowel sounds.  MSK:  No focal spinal tenderness to palpation. Good range of motion in the upper extremities. No obvious joint swelling. EXTREMITIES:  No peripheral edema.   NEURO:  Nonfocal. Well  oriented.  Fatigued affect.    LAB RESULTS: Lab Results  Component Value Date   WBC 11.5* 09/15/2013   NEUTROABS 5.8 09/15/2013   HGB 14.0 09/15/2013   HCT 42.9 09/15/2013   MCV 90.4 09/15/2013   PLT 203 09/15/2013      Chemistry      Component Value Date/Time   NA 137 09/08/2013 1017   NA 136 03/23/2012 1309   K 4.4 09/08/2013 1017   K 3.9 03/23/2012 1309   CL 103 11/16/2012 0838   CL 99 03/23/2012 1309   CO2 26 09/08/2013 1017   CO2 25 03/23/2012 1309   BUN 8.3 09/08/2013 1017   BUN 11 03/23/2012 1309   CREATININE 0.8 09/08/2013 1017   CREATININE 0.69 03/23/2012 1309      Component Value Date/Time   CALCIUM 9.9 09/08/2013 1017   CALCIUM 9.3 03/23/2012 1309   ALKPHOS 152* 09/08/2013 1017   ALKPHOS 146* 03/23/2012 1309   AST 80* 09/08/2013 1017   AST 76* 03/23/2012 1309   ALT 78* 09/08/2013 1017   ALT 80* 03/23/2012 1309   BILITOT 0.58 09/08/2013 1017   BILITOT 0.5 03/23/2012 1309       STUDIES:  Bilateral mammogram and left ultrasound were obtained 08/03/2013, both unremarkable.     ASSESSMENT: A 62 year old [place] woman  (1)   status post left lumpectomy and sentinel lymph node biopsy February 2010 for a T1b N0, stage IA invasive ductal carcinoma,  grade 1, strongly estrogen and progesterone receptor positive, HER2 negative, with a low MIB-1.    (2)  After radiation therapy was completed in May 2010 Cassandra Johnston  started tamoxifen which Cassandra Johnston discontinued on her own in July 2014 due to hair loss.    (3)  Cassandra Johnston also had a sentinel lymph node showing atypical follicles with some BCL-2 positive cells showing a t(14; 18) translocation.     (4)  multiple episodes of mastitis in the left breast, resolved  (5) osteoporosis  (6)  general malaise and fatigue, increasing  PLAN: The patient is doing well from a breast cancer point of view, and upon Dr. Darrall Dears review, he is comfortable with her discontinuing and the estrogen at this time. Accordingly, Cassandra Johnston will not start back on the tamoxifen, and we will begin following her with observation alone with regards to her breast cancer.   With regards to her general malaise, fatigue, and joint pain, we have asked Cassandra Johnston to call us back in 3-4 weeks if Cassandra Johnston is not feeling better, and we will consider a referral to rheumatology, possibly Dr. Dierdre Forth, for further evaluation. I am also repeating her CBC and obtaining a sed rate today.   Dr. Darnelle Catalan also reviewed the patient's labs today and we  will continue to follow her CBC closely, especially the slight elevation in lymphocytes. With the past biopsy of the sentinel lymph node showing atypical follicles, and with the increase in Cassandra Johnston malaise along with elevated temperatures, we would like to obtain a PET scan in the next couple of months to assess for any sign of developing lymphoma. We will try to obtain this in January, and Cassandra Johnston will see Dr. Darnelle Catalan soon thereafter in February for her routine followup.  We are referring her back to Dr. Marina Goodell for further evaluation of nausea with emesis, and also to discuss a screening colonoscopy which will be due setting. Of course Cassandra Johnston'll continue be followed closely by both Dr. Andrey Campanile and Dr. Everardo All, and will call us with any changes or problems.  Voices her understanding and agreement with this plan, and was given this plan and writing today as well.   Hayley Horn PA-C    09/15/2013

## 2013-09-18 ENCOUNTER — Encounter: Payer: Self-pay | Admitting: Internal Medicine

## 2013-10-05 ENCOUNTER — Other Ambulatory Visit: Payer: Self-pay

## 2013-10-18 ENCOUNTER — Ambulatory Visit: Payer: Medicare Other | Admitting: Internal Medicine

## 2013-10-23 ENCOUNTER — Encounter: Payer: Self-pay | Admitting: Endocrinology

## 2013-10-24 ENCOUNTER — Other Ambulatory Visit: Payer: Self-pay | Admitting: Endocrinology

## 2013-10-24 MED ORDER — INSULIN REGULAR HUMAN 100 UNIT/ML IJ SOLN: 35.0000 [IU] | Freq: Three times a day (TID) | INTRAMUSCULAR | Status: DC

## 2013-11-11 ENCOUNTER — Encounter: Payer: Self-pay | Admitting: Physician Assistant

## 2013-11-13 ENCOUNTER — Telehealth: Payer: Self-pay | Admitting: *Deleted

## 2013-11-13 MED ORDER — FLUCONAZOLE 150 MG PO TABS: 150.0000 mg | ORAL_TABLET | Freq: Once | ORAL | Status: DC

## 2013-11-13 NOTE — Telephone Encounter (Signed)
Responding to My Chart Message: Reports recently completed 10 day course of Doxycycline for recurrent mastitis (resolved)-usually 1-2 episodes/year. Now has yeast infection. OK per Zollie Scale, PA to order Fluconazole 150 mg X 1.

## 2013-12-04 ENCOUNTER — Other Ambulatory Visit: Payer: Self-pay | Admitting: Oncology

## 2013-12-04 DIAGNOSIS — Z853 Personal history of malignant neoplasm of breast: Secondary | ICD-10-CM

## 2013-12-05 ENCOUNTER — Ambulatory Visit: Payer: Medicare Other | Admitting: Internal Medicine

## 2013-12-28 ENCOUNTER — Encounter (HOSPITAL_COMMUNITY)
Admission: RE | Admit: 2013-12-28 | Discharge: 2013-12-28 | Disposition: A | Discharge status: Home / Self Care | Payer: Medicare Other | Source: Ambulatory Visit | Attending: Physician Assistant | Admitting: Physician Assistant

## 2013-12-28 ENCOUNTER — Other Ambulatory Visit (HOSPITAL_BASED_OUTPATIENT_CLINIC_OR_DEPARTMENT_OTHER): Payer: Medicare Other

## 2013-12-28 DIAGNOSIS — D72829 Elevated white blood cell count, unspecified: Secondary | ICD-10-CM

## 2013-12-28 DIAGNOSIS — Z853 Personal history of malignant neoplasm of breast: Secondary | ICD-10-CM

## 2013-12-28 DIAGNOSIS — R5381 Other malaise: Secondary | ICD-10-CM

## 2013-12-28 DIAGNOSIS — C50919 Malignant neoplasm of unspecified site of unspecified female breast: Secondary | ICD-10-CM

## 2013-12-28 DIAGNOSIS — R5383 Other fatigue: Secondary | ICD-10-CM

## 2013-12-28 DIAGNOSIS — R509 Fever, unspecified: Secondary | ICD-10-CM

## 2013-12-28 LAB — CBC WITH DIFFERENTIAL/PLATELET
BASO%: 2.4 % — AB (ref 0.0–2.0)
Basophils Absolute: 0.3 10*3/uL — ABNORMAL HIGH (ref 0.0–0.1)
EOS%: 2.7 % (ref 0.0–7.0)
Eosinophils Absolute: 0.4 10*3/uL (ref 0.0–0.5)
HCT: 44 % (ref 34.8–46.6)
HGB: 14.6 g/dL (ref 11.6–15.9)
LYMPH#: 5.7 10*3/uL — AB (ref 0.9–3.3)
LYMPH%: 40.2 % (ref 14.0–49.7)
MCH: 30.1 pg (ref 25.1–34.0)
MCHC: 33.1 g/dL (ref 31.5–36.0)
MCV: 91.1 fL (ref 79.5–101.0)
MONO#: 1.1 10*3/uL — ABNORMAL HIGH (ref 0.1–0.9)
MONO%: 8.2 % (ref 0.0–14.0)
NEUT#: 6.5 10*3/uL (ref 1.5–6.5)
NEUT%: 46.5 % (ref 38.4–76.8)
Platelets: 190 10*3/uL (ref 145–400)
RBC: 4.83 10*6/uL (ref 3.70–5.45)
RDW: 13.6 % (ref 11.2–14.5)
WBC: 14.1 10*3/uL — ABNORMAL HIGH (ref 3.9–10.3)

## 2013-12-28 LAB — COMPREHENSIVE METABOLIC PANEL (CC13)
ALT: 75 U/L — AB (ref 0–55)
AST: 62 U/L — AB (ref 5–34)
Albumin: 4 g/dL (ref 3.5–5.0)
Alkaline Phosphatase: 220 U/L — ABNORMAL HIGH (ref 40–150)
Anion Gap: 14 mEq/L — ABNORMAL HIGH (ref 3–11)
BUN: 9.8 mg/dL (ref 7.0–26.0)
CALCIUM: 9.9 mg/dL (ref 8.4–10.4)
CHLORIDE: 102 meq/L (ref 98–109)
CO2: 23 mEq/L (ref 22–29)
CREATININE: 0.8 mg/dL (ref 0.6–1.1)
Glucose: 256 mg/dl — ABNORMAL HIGH (ref 70–140)
POTASSIUM: 4.2 meq/L (ref 3.5–5.1)
Sodium: 139 mEq/L (ref 136–145)
Total Bilirubin: 1.04 mg/dL (ref 0.20–1.20)
Total Protein: 7.9 g/dL (ref 6.4–8.3)

## 2013-12-28 LAB — GLUCOSE, CAPILLARY: Glucose-Capillary: 249 mg/dL — ABNORMAL HIGH (ref 70–99)

## 2013-12-28 MED ORDER — FLUDEOXYGLUCOSE F - 18 (FDG) INJECTION
19.1000 | Freq: Once | INTRAVENOUS | Status: AC | PRN
  Administered 2013-12-28: 19.1 via INTRAVENOUS

## 2014-01-02 ENCOUNTER — Ambulatory Visit: Payer: Medicare Other | Admitting: Internal Medicine

## 2014-01-04 ENCOUNTER — Telehealth: Payer: Self-pay | Admitting: Oncology

## 2014-01-04 ENCOUNTER — Ambulatory Visit (HOSPITAL_BASED_OUTPATIENT_CLINIC_OR_DEPARTMENT_OTHER): Payer: Medicare Other | Admitting: Oncology

## 2014-01-04 VITALS — BP 143/89 | HR 76 | Temp 98.2°F | Resp 18 | Ht 65.0 in | Wt 202.8 lb

## 2014-01-04 DIAGNOSIS — Z170 Estrogen receptor positive status [ER+]: Secondary | ICD-10-CM

## 2014-01-04 DIAGNOSIS — C50912 Malignant neoplasm of unspecified site of left female breast: Secondary | ICD-10-CM | POA: Insufficient documentation

## 2014-01-04 DIAGNOSIS — Z853 Personal history of malignant neoplasm of breast: Secondary | ICD-10-CM

## 2014-01-04 DIAGNOSIS — C50419 Malignant neoplasm of upper-outer quadrant of unspecified female breast: Secondary | ICD-10-CM

## 2014-01-04 DIAGNOSIS — E119 Type 2 diabetes mellitus without complications: Secondary | ICD-10-CM

## 2014-01-04 HISTORY — DX: Personal history of malignant neoplasm of breast: Z85.3

## 2014-01-04 NOTE — Addendum Note (Signed)
Addended by: Laureen Abrahams on: 01/04/2014 05:24 PM   Modules accepted: Orders

## 2014-01-04 NOTE — Telephone Encounter (Signed)
, °

## 2014-01-04 NOTE — Progress Notes (Signed)
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 Cassandra Johnston underwent left lumpectomy and sentinel lymph node biopsy in February 2010 for a T1 B. N0, grade 1 invasive ductal carcinoma. Tumor was strongly ER and PR positive, Cassandra Johnston-2/neu negative, with a low MIB-1.  Cassandra Johnston underwent radiation therapy which was completed in May of 2010. At that time Cassandra Johnston began on tamoxifen, 20 mg daily.  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 There has been no evidence, however, of lymphoma, either by films or clinically.  Additional treatment history is as detailed below.   INTERVAL HISTORY: Cassandra Johnston for followup of Cassandra Johnston left breast carcinoma. Cassandra interval history is generally stable. Cassandra biggest change is that Cassandra Johnston dad, 45, has developed either pancreatic or stomach cancer. Recall Cassandra Johnston mother died from colon cancer. Cassandra Johnston tells me Cassandra Johnston has been scheduled to meet with Dr. [name] for colonoscopy.  REVIEW OF SYSTEMS: Cassandra Johnston tells me Cassandra Johnston "can't get your sugar regulated." Cassandra Johnston admits that Cassandra Johnston does not exercise. Cassandra Johnston diet is irregular. Cassandra Johnston is now using novel and" still it doesn't go to normal". Cassandra Johnston tends to feel poorly in Cassandra afternoons and sometimes Cassandra Johnston temperature rises to but 100.5. Cassandra Johnston just sits down and wait it out. Cassandra Johnston doesn't take Advil or Aleve for this. When that's going on Cassandra Johnston has some muscle aches and pains. Cassandra Johnston has some neuropathy in Cassandra Johnston fingers bilaterally. Cassandra Johnston denies significant visual changes. Cassandra Johnston short of breath when walking particularly when walking up stairs. 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 Cassandra Johnston  has stress urinary incontinence. Cassandra Johnston is back and joint pain which is not more intense or persistent than before. Cassandra Johnston has occasional headaches. Cassandra Johnston feels anxious and depressed. Cassandra Johnston gets shooting pains sometimes in Cassandra surgical breast, but these are of Cassandra Johnston a brief period a detailed review of systems Johnston was otherwise entirely stable.    PAST MEDICAL HISTORY: Past Medical History  Diagnosis Date  . Breast cancer 2010    left  . Diabetes mellitus   . Breast infection     left breast  . Diverticulosis   . Esophageal stricture   . GERD (gastroesophageal reflux disease)     PAST SURGICAL HISTORY: Past Surgical History  Procedure Laterality Date  . Breast lumpectomy  2010  . Abdominal hysterectomy    . Cholecystectomy    . Appendectomy    . Tonsillectomy    . Tubal ligation    . Fracture surgery      feet  . Colonoscopy    . Hand surgery    . Cystectomy      on thyroid    FAMILY HISTORY Family History  Problem Relation Age of Onset  . Cancer Mother     colon  . Diabetes Mother    GYNECOLOGIC HISTORY:   Cassandra Johnston is GX P2, first pregnancy to term age 74.  Cassandra Johnston had Cassandra Johnston hysterectomy at age 50.  Cassandra Johnston never took hormones.  Cassandra Johnston started having hot flashes about 5 or 6 years ago.  SOCIAL HISTORY: (updated Dec 2013)  Cassandra Johnston previously worked for [employer].  Cassandra Johnston  son, Melvern Sample, works in [place], is married to Vanna Scotland, who also works there.  Cassandra Johnston's father, Lavone Neri, used to work in [place] ER as well.  Cassandra Johnston's daughter, Santa Genera, works at [employer].  Cassandra Johnston lives at home with Cassandra Johnston husband Marcello Moores, and keeps 2 of Cassandra Johnston daughter, Rebbeca Paul, 3 children who are 10 and 16.  Cassandra Johnston attends [employer].   ADVANCED DIRECTIVES:  HEALTH MAINTENANCE: (Updated 09/15/2013) History  Substance Use Topics  . Smoking status: Former Smoker    Quit date: 12/01/2003  . Smokeless tobacco: Never Used  . Alcohol Use: No     Colonoscopy: Dr. [name], June 2010  PAP: s/p  hysterectomy  Bone density: 2010/ osteoporosis  Lipid panel: UTD, Dr. Redmond Pulling   No Known Allergies  Current Outpatient Prescriptions  Medication Sig Dispense Refill  . bromocriptine (PARLODEL) 2.5 MG tablet Take 1 tablet (2.5 mg total) by mouth daily.  30 tablet  11  . citalopram (CELEXA) 40 MG tablet       . Famotidine (PEPCID PO) Take 50 mg by mouth 2 (two) times daily.        . fluconazole (DIFLUCAN) 150 MG tablet Take 1 tablet (150 mg total) by mouth once.  1 tablet  1  . glimepiride (AMARYL) 4 MG tablet Take 4 mg by mouth daily before breakfast.        . insulin regular (HUMULIN R) 100 units/mL injection Inject 0.35 mLs (35 Units total) into Cassandra skin 3 (three) times daily before meals. And syringes 3/day  40 mL  12  . metFORMIN (GLUCOPHAGE) 500 MG tablet Take 500 mg by mouth 2 (two) times daily with a meal.        . tamoxifen (NOLVADEX) 20 MG tablet Take 1 tablet (20 mg total) by mouth daily.  30 tablet  12  . traMADol (ULTRAM) 50 MG tablet       . venlafaxine XR (EFFEXOR-XR) 37.5 MG 24 hr capsule TAKE ONE CAPSULE BY MOUTH EVERY DAY  30 capsule  1   No current facility-administered medications for this visit.    OBJECTIVE: Middle aged white woman in no acute distress Filed Vitals:   01/04/14 1016  BP: 143/89  Pulse: 76  Temp: 98.2 F (36.8 C)  Resp: 18     Body mass index is 33.75 kg/(m^2).    ECOG FS: 1 Filed Weights   01/04/14 1016  Weight: 202 lb 12.8 oz (91.989 kg)   Sclerae unicteric, pupils equal and round Oropharynx clear and moist-- no thrush No cervical or supraclavicular adenopathy Lungs no rales or rhonchi Heart regular rate and rhythm Abd soft, obese, nontender, positive bowel sounds MSK no focal spinal tenderness, no upper extremity lymphedema Neuro: nonfocal, well oriented, appropriate affect Breasts: Cassandra right breast is unremarkable. Cassandra left breast is status post lumpectomy and radiation. There is no evidence of local recurrence. Cassandra left axilla is  benign   LAB RESULTS: Lab Results  Component Value Date   WBC 14.1* 12/28/2013   NEUTROABS 6.5 12/28/2013   HGB 14.6 12/28/2013   HCT 44.0 12/28/2013   MCV 91.1 12/28/2013   PLT 190 12/28/2013      Chemistry      Component Value Date/Time   NA 139 12/28/2013 0756   NA 136 03/23/2012 1309   K 4.2 12/28/2013 0756   K 3.9 03/23/2012 1309   CL 103 11/16/2012 0838   CL 99 03/23/2012 1309   CO2 23 12/28/2013 0756  CO2 25 03/23/2012 1309   BUN 9.8 12/28/2013 0756   BUN 11 03/23/2012 1309   CREATININE 0.8 12/28/2013 0756   CREATININE 0.69 03/23/2012 1309      Component Value Date/Time   CALCIUM 9.9 12/28/2013 0756   CALCIUM 9.3 03/23/2012 1309   ALKPHOS 220* 12/28/2013 0756   ALKPHOS 146* 03/23/2012 1309   AST 62* 12/28/2013 0756   AST 76* 03/23/2012 1309   ALT 75* 12/28/2013 0756   ALT 80* 03/23/2012 1309   BILITOT 1.04 12/28/2013 0756   BILITOT 0.5 03/23/2012 1309     Results for HUNTZelpha, Messing (MRN [id number]) as of 01/04/2014 10:36  Ref. Range 03/23/2012 13:09 11/16/2012 08:38 09/08/2013 10:17 09/15/2013 12:22 12/28/2013 07:56  lymph# Latest Range: 0.9-3.3 10e3/uL 3.5 (H) 3.2 4.3 (H) 4.4 (H) 5.7 (H)    STUDIES:  Nm Pet Image Restag (ps) Skull Base To Thigh  12/28/2013   CLINICAL DATA:  Subsequent treatment strategy for breast carcinoma. Additional concern for lymphoma.  EXAM: NUCLEAR MEDICINE PET SKULL BASE TO THIGH  FASTING BLOOD GLUCOSE:  Value: 261m/dl  TECHNIQUE: 19.1 mCi F-18 FDG was injected intravenously. CT data was obtained and used for attenuation correction and anatomic localization only. (This was not acquired as a diagnostic CT examination.) Additional exam technical data entered on technologist worksheet.  COMPARISON:  NM PET IMAGE INITIAL (PI) SKULL BASE TO THIGH dated 02/12/2009  FINDINGS: NECK  No hypermetabolic lymph nodes in Cassandra neck.  CHEST  No hypermetabolic mediastinal or hilar nodes. No suspicious pulmonary nodules on Cassandra CT scan. Very mild metabolic activity associated with  a 13 mm nodule Cassandra left breast. This is markedly reduced from a large seroma seen Cassandra site previously and likely represents postsurgical change.  ABDOMEN/PELVIS  No abnormal hypermetabolic activity within Cassandra liver, pancreas, adrenal glands, or spleen. No hypermetabolic lymph nodes in Cassandra abdomen or pelvis.  SKELETON  No focal hypermetabolic activity to suggest skeletal metastasis.  IMPRESSION: No evidence of breast cancer recurrence or metastasis.  Small nodule in Cassandra left breast site of large seroma is likely postsurgical granulation.  No evidence of lymphoma.   Electronically Signed   By: SSuzy BouchardM.D.   On: 12/28/2013 12:45    ASSESSMENT: 63y.o. [place] woman  (1)   status post left lumpectomy and sentinel lymph node biopsy February 2010 for a T1b N0, stage IA invasive ductal carcinoma,  grade 1, strongly estrogen and progesterone receptor positive, HER2 negative, with a low MIB-1.    (2)  completed radiation therapy May 2010   (3) Cassandra Johnston started tamoxifen May 2010,discontinued  in July 2014 due to hair loss.    (4)  a sentinel lymph node showed atypical follicles with some BCL-2 positive cells showing a t(14; 18) translocation.     (5)  multiple episodes of mastitis in Cassandra left breast--resolved  (6) osteoporosis   PLAN: VCasilda Carlsis doing fine as far as Cassandra Johnston breast cancer is concerned, and as far as I does we would probably stop followup now. Cassandra concern of course is Cassandra possibility of CLL, picked up several years ago, with a very minimal increase in Cassandra absolute lymphocyte count as documented above.  Cassandra Johnston has symptoms which could possibly be related to lymphoma, namely malaise, fatigue and low-grade fevers. However new lab work nor Cassandra PET scan support this. I think this is all going to be related to Cassandra Johnston chronic illnesses and particularly Cassandra Johnston very poorly controlled diabetes.  I think Cassandra Johnston would do well to  start an exercise program. I gave Cassandra Johnston a pamphlet on [place] program and  strongly encouraged Cassandra Johnston to participate.  Cassandra Johnston is going to see [place] again in October. We will see Cassandra Johnston on a yearly basis for Cassandra next 5 years. There has been no significant change as far as lymphocyte issue is concerned by 2020 we will discontinue followup here.  Chauncey Cruel, MD    01/04/2014

## 2014-03-21 ENCOUNTER — Encounter: Payer: Self-pay | Admitting: Internal Medicine

## 2014-05-17 ENCOUNTER — Encounter: Payer: Self-pay | Admitting: Internal Medicine

## 2014-06-29 ENCOUNTER — Telehealth: Payer: Self-pay

## 2014-06-29 NOTE — Telephone Encounter (Signed)
Diabetic Bundle. Lvom for pt to call back and schedule appointment with Dr. Ellison.  

## 2014-07-18 ENCOUNTER — Encounter: Payer: Medicare Other | Admitting: Internal Medicine

## 2014-07-18 ENCOUNTER — Encounter: Payer: Self-pay | Admitting: Internal Medicine

## 2014-07-30 ENCOUNTER — Encounter: Payer: Medicare Other | Admitting: Internal Medicine

## 2014-09-04 ENCOUNTER — Telehealth: Payer: Self-pay | Admitting: Oncology

## 2014-09-04 NOTE — Telephone Encounter (Signed)
pt called and moved oct appt to nov. pt has new d/t.

## 2014-09-06 ENCOUNTER — Other Ambulatory Visit: Payer: Medicare Other

## 2014-09-13 ENCOUNTER — Ambulatory Visit: Payer: Medicare Other | Admitting: Nurse Practitioner

## 2014-10-10 ENCOUNTER — Other Ambulatory Visit: Payer: Self-pay | Admitting: *Deleted

## 2014-10-10 DIAGNOSIS — C50912 Malignant neoplasm of unspecified site of left female breast: Secondary | ICD-10-CM

## 2014-10-11 ENCOUNTER — Other Ambulatory Visit: Payer: Self-pay | Admitting: *Deleted

## 2014-10-11 ENCOUNTER — Other Ambulatory Visit: Payer: Medicare Other

## 2014-10-11 DIAGNOSIS — C50912 Malignant neoplasm of unspecified site of left female breast: Secondary | ICD-10-CM

## 2014-10-18 ENCOUNTER — Ambulatory Visit: Payer: Medicare Other | Admitting: Nurse Practitioner

## 2014-12-13 ENCOUNTER — Encounter: Payer: Self-pay | Admitting: Internal Medicine

## 2014-12-17 ENCOUNTER — Other Ambulatory Visit: Payer: Self-pay | Admitting: Family Medicine

## 2014-12-17 DIAGNOSIS — Z853 Personal history of malignant neoplasm of breast: Secondary | ICD-10-CM

## 2015-01-21 ENCOUNTER — Ambulatory Visit
Admission: RE | Admit: 2015-01-21 | Discharge: 2015-01-21 | Disposition: A | Discharge status: Home / Self Care | Payer: Medicare Other | Source: Ambulatory Visit | Attending: Family Medicine | Admitting: Family Medicine

## 2015-01-21 DIAGNOSIS — Z853 Personal history of malignant neoplasm of breast: Secondary | ICD-10-CM

## 2015-01-30 ENCOUNTER — Encounter: Payer: Self-pay | Admitting: Internal Medicine

## 2015-03-14 ENCOUNTER — Encounter: Payer: Self-pay | Admitting: Internal Medicine

## 2015-09-12 ENCOUNTER — Telehealth: Payer: Self-pay | Admitting: Cardiovascular Disease

## 2015-09-12 NOTE — Telephone Encounter (Signed)
Received records from [place] for appointment on 09/24/15 with Dr Oval Linsey.  Records given to [facility] (medical records) for Dr Blenda Mounts schedule on 09/24/15. lp

## 2015-09-19 ENCOUNTER — Telehealth: Payer: Self-pay | Admitting: Physician Assistant

## 2015-09-19 DIAGNOSIS — M5440 Lumbago with sciatica, unspecified side: Secondary | ICD-10-CM

## 2015-09-19 MED ORDER — CELECOXIB 100 MG PO CAPS: 100.0000 mg | ORAL_CAPSULE | Freq: Two times a day (BID) | ORAL | Status: DC

## 2015-09-19 MED ORDER — CYCLOBENZAPRINE HCL 10 MG PO TABS: 10.0000 mg | ORAL_TABLET | Freq: Three times a day (TID) | ORAL | Status: DC | PRN

## 2015-09-19 NOTE — Progress Notes (Signed)
We are sorry that you are not feeling well.  Here is how we plan to help!  Based on what you have shared with me it looks like you mostly have acute back pain with sciatica  Acute back pain is defined as musculoskeletal pain that can resolve in 1-3 weeks with conservative treatment.  I have prescribed Flexeril 10 mg every eight hours as needed which is a muscle relaxer.  You have listed anti-inflammatories in your "allergy" list stating they give you nausea and upset stomach. I have sent in one called Celebrex to take for pain and inflammation as it is a selective medication and should have less chance of causing stomach upset. If it does, you can use extra-strength tylenol instead for pain.  Please keep in mind that muscle relaxer's can cause fatigue and should not be taken while at work or driving.  Back pain is very common.  The pain often gets better over time.  The cause of back pain is usually not dangerous.  Most people can learn to manage their back pain on their own.  Home Care  Stay active.  Start with short walks on flat ground if you can.  Try to walk farther each day.  Do not sit, drive or stand in one place for more than 30 minutes.  Do not stay in bed.  Do not avoid exercise or work.  Activity can help your back heal faster.  Be careful when you bend or lift an object.  Bend at your knees, keep the object close to you, and do not twist.  Sleep on a firm mattress.  Lie on your side, and bend your knees.  If you lie on your back, put a pillow under your knees.  Only take medicines as told by your doctor.  Put ice on the injured area.  Put ice in a plastic bag  Place a towel between your skin and the bag  Leave the ice on for 15-20 minutes, 3-4 times a day for the first 2-3 days.  After that, you can switch between ice and heat packs.  Ask your doctor about back exercises or massage.  Avoid feeling anxious or stressed.  Find good ways to deal with stress, such as  exercise.  Get Help Right Way If:  Your pain does not go away with rest or medicine.  Your pain does not go away in 1 week.  You have new problems.  You do not feel well.  The pain spreads into your legs.  You cannot control when you poop (bowel movement) or pee (urinate)  You feel sick to your stomach (nauseous) or throw up (vomit)  You have belly (abdominal) pain.  You feel like you may pass out (faint).  If you develop a fever.  Make Sure you:  Understand these instructions.  Will watch your condition  Will get help right away if you are not doing well or get worse.  Your e-visit answers were reviewed by a board certified advanced clinical practitioner to complete your personal care plan.  Depending on the condition, your plan could have included both over the counter or prescription medications.  If there is a problem please reply  once you have received a response from your provider.  Your safety is important to [place].  If you have drug allergies check your prescription carefully.    You can use MyChart to ask questions about today's visit, request a non-urgent call back, or ask for a work or  school excuse for 24 hours related to this e-Visit. If it has been greater than 24 hours you will need to follow up with your provider, or enter a new e-Visit to address those concerns.  You will get an e-mail in the next two days asking about your experience.  I hope that your e-visit has been valuable and will speed your recovery. Thank you for using e-visits.

## 2015-09-24 ENCOUNTER — Encounter: Payer: Self-pay | Admitting: Cardiovascular Disease

## 2015-09-24 ENCOUNTER — Ambulatory Visit (INDEPENDENT_AMBULATORY_CARE_PROVIDER_SITE_OTHER): Payer: Medicare Other | Admitting: Cardiovascular Disease

## 2015-09-24 ENCOUNTER — Ambulatory Visit (INDEPENDENT_AMBULATORY_CARE_PROVIDER_SITE_OTHER): Payer: Medicare Other

## 2015-09-24 VITALS — BP 150/90 | HR 81 | Ht 64.5 in | Wt 197.1 lb

## 2015-09-24 DIAGNOSIS — R079 Chest pain, unspecified: Secondary | ICD-10-CM | POA: Diagnosis not present

## 2015-09-24 DIAGNOSIS — R002 Palpitations: Secondary | ICD-10-CM

## 2015-09-24 DIAGNOSIS — R0602 Shortness of breath: Secondary | ICD-10-CM

## 2015-09-24 DIAGNOSIS — R072 Precordial pain: Secondary | ICD-10-CM

## 2015-09-24 DIAGNOSIS — R011 Cardiac murmur, unspecified: Secondary | ICD-10-CM

## 2015-09-24 HISTORY — DX: Chest pain, unspecified: R07.9

## 2015-09-24 HISTORY — DX: Shortness of breath: R06.02

## 2015-09-24 HISTORY — DX: Palpitations: R00.2

## 2015-09-24 NOTE — Progress Notes (Signed)
Cardiology Office Note   Date:  09/24/2015   ID:  Johnston, Cassandra 08/03/1951, MRN [id number]  PCP:  Woody Seller, MD  Cardiologist:   Sharol Harness, MD   Chief Complaint  Patient presents with  . New Evaluation    sob/cp      History of Present Illness: Cassandra Johnston is a 64 y.o. female with the diabetes type 2, hypertension, depression, breast cancer s/p XRT, and fibromyalgia who presents for an evaluation of chest pain.  [name] saw Dr. Betty Martinique on 09/05/24. At that appointment she complained of chest pain and her EKG was noted to have anterior T-wave inversions.  She was also noted to have a heart murmur and abnormal heart rhythm.  Thirty years ago she had palpitations and took Inderall.  She felt like she was walking in slow motion so she stopped taking it.  Over the last year she has noted episodes of shortness of breath and diaphoresis with exertion. Sometimes it is also associated with chest discomfort, but not every time. The episodes last for minutes at a time and improved with rest. She also has pain in her upper back when this occurs.  The symptoms have been getting worse and occurring more frequently over the last month.  [name] also reports episodes of sudden like her heart is racing. This happens at least a couple times per week. It is associated with lightheadedness, dizziness, and shortness of breath. Sometimes she also has chest discomfort with it. She denies any lower extremity edema, orthopnea or PND. She sleeps on 3 pillows but this is for comfort rather than shortness of breath. She does have a history of gastroesophageal reflux disease that is worse if she lays flat.  Ms. Droege tries to exercise by riding a stationary bicycle 3 times per week. Lately she's not been able to do this due to shortness of breath and groin pain. She denies any exertional chest pain. She tries hard to have a healthy diet. She eats fried foods once 2 times per week and  avoids red meats. She also eats lots of fruits and vegetables.  She does not check her blood pressure regularly but states that it typically runs around 140/70.    Past Medical History  Diagnosis Date  . Breast cancer ([place]) 2010    left  . Diabetes mellitus   . Breast infection     left breast  . Diverticulosis   . Esophageal stricture   . GERD (gastroesophageal reflux disease)   . Chest pain 09/24/2015  . Shortness of breath 09/24/2015  . Palpitations 09/24/2015    Past Surgical History  Procedure Laterality Date  . Breast lumpectomy  2010  . Abdominal hysterectomy    . Cholecystectomy    . Appendectomy    . Tonsillectomy    . Tubal ligation    . Fracture surgery      feet  . Colonoscopy    . Hand surgery    . Cystectomy      on thyroid     Current Outpatient Prescriptions  Medication Sig Dispense Refill  . benazepril (LOTENSIN) 10 MG tablet Take 10 mg by mouth daily.    . celecoxib (CELEBREX) 100 MG capsule Take 1 capsule (100 mg total) by mouth 2 (two) times daily. 20 capsule 0  . Famotidine (PEPCID PO) Take 50 mg by mouth 2 (two) times daily.      . fluconazole (DIFLUCAN) 150 MG tablet Take  1 tablet (150 mg total) by mouth once. 1 tablet 1  . glimepiride (AMARYL) 4 MG tablet Take 4 mg by mouth daily.    Marland Kitchen LEVEMIR FLEXTOUCH 100 UNIT/ML Pen Inject 35 Units into the skin daily.    . meloxicam (MOBIC) 15 MG tablet Take 15 mg by mouth daily.    . metFORMIN (GLUCOPHAGE) 500 MG tablet Take 1,000 mg by mouth 2 (two) times daily.    . ondansetron (ZOFRAN) 4 MG tablet Take 1,000 mg by mouth 2 (two) times daily.    . ONE TOUCH ULTRA TEST test strip Apply 1 strip topically daily. Use one strip to test BG each morning    . tiZANidine (ZANAFLEX) 4 MG tablet Take 4 mg by mouth 2 (two) times daily.    . TRULICITY 1.5 OZ/3.0QM SOPN Inject 1.5 mg into the skin once a week.    . venlafaxine XR (EFFEXOR-XR) 75 MG 24 hr capsule Take 75 mg by mouth daily.     No current  facility-administered medications for this visit.    Allergies:   Review of patient's allergies indicates no known allergies.    Social History:  The patient  reports that she quit smoking about 11 years ago. She has never used smokeless tobacco. She reports that she does not drink alcohol or use illicit drugs.   Family History:  The patient's family history includes Atrial fibrillation in her father; CAD in her father; Cancer in her mother; Diabetes in her mother.    ROS:  Please see the history of present illness.   Otherwise, review of systems are positive for none.   All other systems are reviewed and negative.    PHYSICAL EXAM: VS:  BP 150/90 mmHg  Pulse 81  Ht 5' 4.5" (1.638 m)  Wt 89.404 kg (197 lb 1.6 oz)  BMI 33.32 kg/m2 , BMI Body mass index is 33.32 kg/(m^2). GENERAL:  Well appearing HEENT:  Pupils equal round and reactive, fundi not visualized, oral mucosa unremarkable NECK:  No jugular venous distention, waveform within normal limits, carotid upstroke brisk and symmetric, no bruits, no thyromegaly LYMPHATICS:  No cervical adenopathy LUNGS:  Clear to auscultation bilaterally HEART:  RRR.  PMI not displaced or sustained,S1 and S2 within normal limits, no S3, no S4, no clicks, no rubs, II/VI systolic murmur  ABD:  Flat, positive bowel sounds normal in frequency in pitch, no bruits, no rebound, no guarding, no midline pulsatile mass, no hepatomegaly, no splenomegaly EXT:  2 plus pulses throughout, no edema, no cyanosis no clubbing SKIN:  No rashes no nodules NEURO:  Cranial nerves II through XII grossly intact, motor grossly intact throughout PSYCH:  Cognitively intact, oriented to person place and time    EKG:  EKG is ordered today. The ekg ordered today demonstrates sinus rhythm at 81 bpm. Nonspecific ST and T changes.   Recent Labs: No results found for requested labs within last 365 days.   05/2015: A1c 11.4 Na 134, K 4.1, BUN 12, Creatinine 0.71 TSH 5.266, fT4  0.82, T3 2.6 Chol 180, tri 163, HDL 52, LDL 96  Lipid Panel No results found for: CHOL, TRIG, HDL, CHOLHDL, VLDL, LDLCALC, LDLDIRECT    Wt Readings from Last 3 Encounters:  09/24/15 89.404 kg (197 lb 1.6 oz)  01/04/14 91.989 kg (202 lb 12.8 oz)  09/15/13 89.631 kg (197 lb 9.6 oz)      ASSESSMENT AND PLAN:  # Chest pain:  Ms. Economou's chest pain is concerning for ischemia. We  will refer her for exercise Cardiolite.  # Shortness of breath:  It is unclear whether the symptoms are due to ischemia or heart failure. Could also be due to primary pulmonary disease. We will obtain an echo to evaluate for structural heart disease and a stress test as above to rule out ischemia. She does not appear volume overloaded at this time.   # Hypertension: Ms. Macinnes's blood pressure is poorly controlled today. She states that it typically is much better controlled and thinks that she may have an element of white coat hypertension. We have asked her to check her blood pressure over the next 2 weeks and call us if her blood pressure is not consistently under 140/90. For now she will continue benazepril   # Palpitations: Ms. Cale has some ectopy on exam today that was also noted that her prior appointment. The EKG today is normal. We will obtain a 48 hour Holter to attempt to catch these rhythms and associate them with her symptoms. She has already undergone thyroid function testing, CBC, and basic metabolic panel that were unremarkable.   Current medicines are reviewed at length with the patient today.  The patient does not have concerns regarding medicines.  The following changes have been made:  no change  Labs/ tests ordered today include:   Orders Placed This Encounter  Procedures  . Myocardial Perfusion Imaging  . Holter monitor - 48 hour  . EKG 12-Lead  . ECHOCARDIOGRAM COMPLETE     Disposition:   FU with Haydin Dunn C. Oval Linsey, MD in 6 months.   Signed, Sharol Harness, MD  09/24/2015  3:13 PM    [place]

## 2015-09-24 NOTE — Patient Instructions (Signed)
Your physician has requested that you have an echocardiogram. Echocardiography is a painless test that uses sound waves to create images of your heart. It provides your doctor with information about the size and shape of your heart and how well your heart's chambers and valves are working. This procedure takes approximately one hour. There are no restrictions for this procedure.  Your physician has recommended that you wear a 48 hour holter monitor. Holter monitors are medical devices that record the heart's electrical activity. Doctors most often use these monitors to diagnose arrhythmias. Arrhythmias are problems with the speed or rhythm of the heartbeat. The monitor is a small, portable device. You can wear one while you do your normal daily activities. This is usually used to diagnose what is causing palpitations/syncope (passing out).  Your physician has requested that you have en exercise stress myoview. For further information please visit [email or web address]. Please follow instruction sheet, as given.  Dr Oval Linsey recommends that you schedule a follow-up appointment in 6 months. You will receive a reminder letter in the mail two months in advance. If you don't receive a letter, please call our office to schedule the follow-up appointment.  If you need a refill on your cardiac medications before your next appointment, please call your pharmacy.

## 2015-09-30 ENCOUNTER — Encounter: Payer: Self-pay | Admitting: Cardiovascular Disease

## 2015-10-02 ENCOUNTER — Other Ambulatory Visit: Payer: Self-pay

## 2015-10-02 ENCOUNTER — Ambulatory Visit (HOSPITAL_COMMUNITY): Payer: Medicare Other | Attending: Cardiology

## 2015-10-02 DIAGNOSIS — R011 Cardiac murmur, unspecified: Secondary | ICD-10-CM | POA: Insufficient documentation

## 2015-10-02 DIAGNOSIS — E119 Type 2 diabetes mellitus without complications: Secondary | ICD-10-CM | POA: Insufficient documentation

## 2015-10-02 DIAGNOSIS — I340 Nonrheumatic mitral (valve) insufficiency: Secondary | ICD-10-CM | POA: Insufficient documentation

## 2015-10-02 DIAGNOSIS — C50919 Malignant neoplasm of unspecified site of unspecified female breast: Secondary | ICD-10-CM | POA: Insufficient documentation

## 2015-10-02 DIAGNOSIS — E785 Hyperlipidemia, unspecified: Secondary | ICD-10-CM | POA: Diagnosis not present

## 2015-10-02 DIAGNOSIS — R0602 Shortness of breath: Secondary | ICD-10-CM | POA: Diagnosis not present

## 2015-10-02 DIAGNOSIS — I071 Rheumatic tricuspid insufficiency: Secondary | ICD-10-CM | POA: Insufficient documentation

## 2015-10-02 DIAGNOSIS — I5189 Other ill-defined heart diseases: Secondary | ICD-10-CM | POA: Insufficient documentation

## 2015-10-02 DIAGNOSIS — K219 Gastro-esophageal reflux disease without esophagitis: Secondary | ICD-10-CM | POA: Diagnosis not present

## 2015-10-04 ENCOUNTER — Telehealth (HOSPITAL_COMMUNITY): Payer: Self-pay

## 2015-10-04 NOTE — Telephone Encounter (Signed)
Encounter complete. 

## 2015-10-07 ENCOUNTER — Telehealth: Payer: Self-pay | Admitting: *Deleted

## 2015-10-07 NOTE — Telephone Encounter (Signed)
-----   Message from Skeet Latch, MD sent at 10/07/2015  3:11 PM EST ----- Holter showed frequent early beats from the top chambers of the heart.  This is not dangerous but could cause her to feel palpitations.  Please start metoprolol succinate 25 mg daily.  This will also help her blood pressure.  Follow up in 1 month.

## 2015-10-07 NOTE — Telephone Encounter (Signed)
Spoke to patient. Result given . Verbalized understanding  PATIENT AWARE TO START ON 10/09/15 AFTER MYOVIEW. WILL NEED APPOINTMENT IN 1 MONTH  FOR BLOOD PRESSURE.

## 2015-10-09 ENCOUNTER — Ambulatory Visit (HOSPITAL_COMMUNITY)
Admission: RE | Admit: 2015-10-09 | Discharge: 2015-10-09 | Disposition: A | Discharge status: Home / Self Care | Payer: Medicare Other | Source: Ambulatory Visit | Attending: Cardiovascular Disease | Admitting: Cardiovascular Disease

## 2015-10-09 DIAGNOSIS — R079 Chest pain, unspecified: Secondary | ICD-10-CM

## 2015-10-09 DIAGNOSIS — R0602 Shortness of breath: Secondary | ICD-10-CM

## 2015-10-10 ENCOUNTER — Telehealth: Payer: Self-pay | Admitting: Cardiovascular Disease

## 2015-10-10 MED ORDER — METOPROLOL SUCCINATE ER 25 MG PO TB24: 25.0000 mg | ORAL_TABLET | Freq: Every day | ORAL | Status: DC

## 2015-10-10 NOTE — Telephone Encounter (Signed)
Echo results given to patient. Also called in her metoprolol to [facility] per her request. She has previously been instructed on when to start this medication and acknowledges understanding.

## 2015-10-10 NOTE — Telephone Encounter (Signed)
Pt would like her Echo results from 10-02-15 please.

## 2015-10-22 ENCOUNTER — Telehealth (HOSPITAL_COMMUNITY): Payer: Self-pay

## 2015-10-22 NOTE — Telephone Encounter (Signed)
Encounter complete. 

## 2015-10-29 ENCOUNTER — Ambulatory Visit (HOSPITAL_COMMUNITY)
Admission: RE | Admit: 2015-10-29 | Discharge: 2015-10-29 | Disposition: A | Discharge status: Home / Self Care | Payer: Medicare Other | Source: Ambulatory Visit | Attending: Cardiovascular Disease | Admitting: Cardiovascular Disease

## 2015-10-29 DIAGNOSIS — I10 Essential (primary) hypertension: Secondary | ICD-10-CM | POA: Diagnosis not present

## 2015-10-29 DIAGNOSIS — Z87891 Personal history of nicotine dependence: Secondary | ICD-10-CM | POA: Insufficient documentation

## 2015-10-29 DIAGNOSIS — R42 Dizziness and giddiness: Secondary | ICD-10-CM | POA: Diagnosis not present

## 2015-10-29 DIAGNOSIS — E119 Type 2 diabetes mellitus without complications: Secondary | ICD-10-CM | POA: Insufficient documentation

## 2015-10-29 DIAGNOSIS — R079 Chest pain, unspecified: Secondary | ICD-10-CM | POA: Diagnosis not present

## 2015-10-29 DIAGNOSIS — R002 Palpitations: Secondary | ICD-10-CM | POA: Diagnosis not present

## 2015-10-29 DIAGNOSIS — Z8249 Family history of ischemic heart disease and other diseases of the circulatory system: Secondary | ICD-10-CM | POA: Insufficient documentation

## 2015-10-29 DIAGNOSIS — E669 Obesity, unspecified: Secondary | ICD-10-CM | POA: Diagnosis not present

## 2015-10-29 DIAGNOSIS — Z6833 Body mass index (BMI) 33.0-33.9, adult: Secondary | ICD-10-CM | POA: Insufficient documentation

## 2015-10-29 DIAGNOSIS — R0602 Shortness of breath: Secondary | ICD-10-CM | POA: Insufficient documentation

## 2015-10-29 DIAGNOSIS — R0609 Other forms of dyspnea: Secondary | ICD-10-CM | POA: Insufficient documentation

## 2015-10-29 LAB — MYOCARDIAL PERFUSION IMAGING
CHL CUP NUCLEAR SDS: 0
CHL CUP NUCLEAR SRS: 0
CHL CUP NUCLEAR SSS: 0
CHL CUP RESTING HR STRESS: 62 {beats}/min
LV sys vol: 24 mL
LVDIAVOL: 83 mL
NUC STRESS TID: 0.96
Peak HR: 93 {beats}/min

## 2015-10-29 MED ORDER — TECHNETIUM TC 99M SESTAMIBI GENERIC - CARDIOLITE
10.4000 | Freq: Once | INTRAVENOUS | Status: AC | PRN
  Administered 2015-10-29: 10.4 via INTRAVENOUS

## 2015-10-29 MED ORDER — TECHNETIUM TC 99M SESTAMIBI GENERIC - CARDIOLITE
32.0000 | Freq: Once | INTRAVENOUS | Status: AC | PRN
  Administered 2015-10-29: 32 via INTRAVENOUS

## 2015-10-29 MED ORDER — REGADENOSON 0.4 MG/5ML IV SOLN
0.4000 mg | Freq: Once | INTRAVENOUS | Status: AC
  Administered 2015-10-29: 0.4 mg via INTRAVENOUS

## 2015-10-31 ENCOUNTER — Telehealth: Payer: Self-pay | Admitting: *Deleted

## 2015-10-31 NOTE — Telephone Encounter (Signed)
Spoke to patient. Result given . Verbalized understanding  

## 2015-10-31 NOTE — Telephone Encounter (Signed)
-----   Message from Skeet Latch, MD sent at 10/29/2015  2:54 PM EST ----- Normal stress test.

## 2015-11-19 ENCOUNTER — Encounter: Payer: Medicare Other | Admitting: Cardiovascular Disease

## 2015-11-19 NOTE — Progress Notes (Signed)
d This encounter was created in error - please disregard.

## 2015-11-27 ENCOUNTER — Encounter: Payer: Self-pay | Admitting: *Deleted

## 2015-12-04 ENCOUNTER — Ambulatory Visit (INDEPENDENT_AMBULATORY_CARE_PROVIDER_SITE_OTHER): Payer: Medicare Other | Admitting: Podiatry

## 2015-12-04 ENCOUNTER — Encounter: Payer: Self-pay | Admitting: Podiatry

## 2015-12-04 VITALS — BP 156/78 | HR 78 | Temp 98.3°F | Resp 16

## 2015-12-04 DIAGNOSIS — L03031 Cellulitis of right toe: Secondary | ICD-10-CM

## 2015-12-04 DIAGNOSIS — L03011 Cellulitis of right finger: Secondary | ICD-10-CM

## 2015-12-04 MED ORDER — HYDROCODONE-ACETAMINOPHEN 5-325 MG PO TABS: 1.0000 | ORAL_TABLET | Freq: Four times a day (QID) | ORAL | Status: DC | PRN

## 2015-12-04 NOTE — Patient Instructions (Signed)

## 2015-12-08 ENCOUNTER — Encounter: Payer: Self-pay | Admitting: Podiatry

## 2015-12-08 NOTE — Progress Notes (Signed)
Patient ID: Cassandra Johnston, female   DOB: 24-Nov-1951, 65 y.o.   MRN: [id number]  Patient called the call service on 12/06/15 at 7:21pm stating that she had a blister form around the nail procedure site and it is red. Denies any red streaking, pus. She states that she is an uncontrolled diabetic. Will start keflex 500mg  TID. This was called into her pharmacy. Epsom salt soaks. If worsening to call the office or go to the ER. Will schedule follow-up.

## 2015-12-09 NOTE — Progress Notes (Signed)
Subjective:     Patient ID: Cassandra Johnston, female   DOB: 1951/01/28, 65 y.o.   MRN: [id number]  HPI patient presents with pain in the right big toe stating that it's been draining and that she's tried to trim and soak it without relief   Review of Systems  All other systems reviewed and are negative.      Objective:   Physical Exam  Constitutional: She is oriented to person, place, and time.  Cardiovascular: Intact distal pulses.   Musculoskeletal: Normal range of motion.  Neurological: She is oriented to person, place, and time.  Skin: Skin is warm.  Nursing note and vitals reviewed.  neurovascular status found to be intact muscle strength adequate range of motion within normal limits with patient noted to have an incurvated right hallux lateral border with distal redness and drainage that's present and mildly painful when pressed. It is localized with no proximal edema erythema drainage and patient is well oriented 3 with good digital perfusion     Assessment:      mild paronychia infection right hallux lateral border localized in nature    Plan:      H&P and condition reviewed and today I infiltrated 60 mg Xylocaine Marcaine mixture remove the corner and removed proud flesh and allowed channel for drainage. Instructed on soaks and reappoint

## 2015-12-10 ENCOUNTER — Encounter: Payer: Self-pay | Admitting: Podiatry

## 2015-12-10 ENCOUNTER — Ambulatory Visit (INDEPENDENT_AMBULATORY_CARE_PROVIDER_SITE_OTHER): Payer: Medicare Other | Admitting: Podiatry

## 2015-12-10 ENCOUNTER — Telehealth: Payer: Self-pay | Admitting: *Deleted

## 2015-12-10 VITALS — BP 158/78 | HR 68 | Temp 99.1°F | Resp 16

## 2015-12-10 DIAGNOSIS — L02611 Cutaneous abscess of right foot: Secondary | ICD-10-CM

## 2015-12-10 DIAGNOSIS — L03031 Cellulitis of right toe: Secondary | ICD-10-CM

## 2015-12-10 DIAGNOSIS — L03011 Cellulitis of right finger: Secondary | ICD-10-CM

## 2015-12-10 MED ORDER — CIPROFLOXACIN HCL 500 MG PO TABS: 500.0000 mg | ORAL_TABLET | Freq: Two times a day (BID) | ORAL | Status: DC

## 2015-12-10 MED ORDER — HYDROCODONE-ACETAMINOPHEN 5-325 MG PO TABS: 1.0000 | ORAL_TABLET | ORAL | Status: DC | PRN

## 2015-12-10 NOTE — Progress Notes (Signed)
Patient ID: Cassandra Johnston, female   DOB: 1951-10-08, 65 y.o.   MRN: [id number]  Subjective:  65 year old female presents the office today for follow-up evaluation status post right partial nail avulsion due to paronychia. She did call over the weekend due to concerns of blistered redness around the toe and she was started on Keflex. She states of the toe is been about the same since the weekend. She has noticed some  bloodydrainage and she was a small amount of pus coming from the nail border. She states that it remains red around the toenail but denies any red streaks. The area is painful with pressure in shoe gear. She states her last blood sugar was 248 this morning. Her last A1c was  Around 9. No other complaints. Denies any systemic complaints such as fevers, chills, nausea, vomiting. No calf pain, chest pain, shortness of breath.  Objective: AAO 3, NAD DP/PT pulses 2/4, CRT less than 3 seconds Status post partial nail avulsion the lateral portion the right hallux toenail. There is  hypergranulation tissue present within the procedure site. There is mild erythema around the nail border mostly along the lateral nail border without any ascending cellulitis. There is no drainage or pus expressed. No fluctuance or crepitus. No malodor. Mild edema to the hallux.  No other open lesions or pre-ulcerative lesions. No pain with calf compression, swelling, warmth, erythema.  Assessment: 65 year old female with cellulitis right second toe after undergoing  partial nail avulsion due to paronychia  Plan: -Treatment options discussed including all alternatives, risks, and complications -Procedure site was debrided. There is no drainage or pus expressed today. Recommended continued Epson salt soaks twice a day cover with antibiotic ointment and a Band-Aid. Continue Keflex. Will add ciprofloxacin. Refilled Vicodin due to pain. Surgical shoe.  -Monitor for any clinical signs or symptoms of infection and  directed to call the office immediately should any occur or go to the ER. -Follow-up in 1 week or sooner if any problems arise. In the meantime, encouraged to call the office with any questions, concerns, change in symptoms.  *x-ray next appointment  Celesta Gentile, DPM

## 2015-12-10 NOTE — Telephone Encounter (Signed)
Called patient at [phone] (Home #) to check to see how they were doing from their Paronychia procedure that was performed on Wednesday, December 04, 2015. Pt stated, "Pain in toe is worse and toe is a mess". Toe is red, purple, oozing and bloody. Pt also stated, "has blood blisters on toe". Pt is soaking their toe twice a day. Pt is on antibiotic and stated, "is not helping". Pt said they are on a low dose of hydrocodone, but are out of this medication. Pt is seeing Dr. Jacqualyn Posey today (12/10/15) at 3:00 pm for a follow-up.

## 2015-12-16 ENCOUNTER — Ambulatory Visit: Payer: Medicare Other | Admitting: Podiatry

## 2015-12-17 ENCOUNTER — Encounter: Payer: Self-pay | Admitting: Podiatry

## 2015-12-17 NOTE — Telephone Encounter (Signed)
Left message [phone], informing pt I had spoken with one of the receptionist that took care of collecting co-payment, and she said it was to the doctor's discretion as to whether the co-pay was required for follow-up, but since her next visit was out of her global period there would more than likely be a co-pay.  I encouraged pt to keep her appt to make certain her health was taken care of.

## 2015-12-19 ENCOUNTER — Telehealth: Payer: Self-pay | Admitting: *Deleted

## 2015-12-19 ENCOUNTER — Ambulatory Visit (INDEPENDENT_AMBULATORY_CARE_PROVIDER_SITE_OTHER): Payer: Medicare Other

## 2015-12-19 ENCOUNTER — Ambulatory Visit (INDEPENDENT_AMBULATORY_CARE_PROVIDER_SITE_OTHER): Payer: Medicare Other | Admitting: Podiatry

## 2015-12-19 VITALS — Temp 99.5°F

## 2015-12-19 DIAGNOSIS — L03031 Cellulitis of right toe: Secondary | ICD-10-CM | POA: Diagnosis not present

## 2015-12-19 DIAGNOSIS — M79674 Pain in right toe(s): Secondary | ICD-10-CM | POA: Diagnosis not present

## 2015-12-19 DIAGNOSIS — L02611 Cutaneous abscess of right foot: Secondary | ICD-10-CM | POA: Diagnosis not present

## 2015-12-19 DIAGNOSIS — L03011 Cellulitis of right finger: Secondary | ICD-10-CM

## 2015-12-19 MED ORDER — FLUCONAZOLE 150 MG PO TABS: 150.0000 mg | ORAL_TABLET | Freq: Every day | ORAL | Status: DC

## 2015-12-19 MED ORDER — HYDROCODONE-ACETAMINOPHEN 10-325 MG PO TABS: 1.0000 | ORAL_TABLET | Freq: Four times a day (QID) | ORAL | Status: DC | PRN

## 2015-12-19 NOTE — Telephone Encounter (Signed)
[facility] pharmacy called states pt's Diflucan has not been called in.  I reviewed pt's medication orders and the Diflucan had been sent to [facility].  I changed order to [facility] in [place], and was unable to contact a pharmacy employee, so left message on pt's home phone explaining the change, and that the order was at [facility].

## 2015-12-19 NOTE — Progress Notes (Signed)
Subjective:     Patient ID: Cassandra Johnston, female   DOB: November 08, 1951, 65 y.o.   MRN: [id number]  HPI patient presents stating it seems some better but I still have some redness and concerns about my right big toe   Review of Systems     Objective:   Physical Exam Neurovascular status unchanged with some redness localized on the proximal portion right hallux lateral side not within the nailbed that we worked on but slightly more lateral to this area with no active drainage noted    Assessment:     Localized infection paronychia with no indications of spread    Plan:     As a precautionary measure I x-rayed it which I reviewed with patient I then applied Iodosorb to try to dry it out with sterile dressing gave instructions on soaks and if symptoms were to reoccur she is to let us know immediately

## 2016-03-14 ENCOUNTER — Emergency Department (INDEPENDENT_AMBULATORY_CARE_PROVIDER_SITE_OTHER): Payer: Medicare Other

## 2016-03-14 ENCOUNTER — Encounter: Payer: Self-pay | Admitting: Emergency Medicine

## 2016-03-14 ENCOUNTER — Emergency Department
Admission: EM | Admit: 2016-03-14 | Discharge: 2016-03-14 | Disposition: A | Discharge status: Home / Self Care | Payer: Medicare Other | Source: Home / Self Care | Attending: Family Medicine | Admitting: Family Medicine

## 2016-03-14 DIAGNOSIS — M7062 Trochanteric bursitis, left hip: Secondary | ICD-10-CM | POA: Diagnosis not present

## 2016-03-14 DIAGNOSIS — M25551 Pain in right hip: Secondary | ICD-10-CM | POA: Diagnosis not present

## 2016-03-14 DIAGNOSIS — M869 Osteomyelitis, unspecified: Secondary | ICD-10-CM | POA: Diagnosis not present

## 2016-03-14 MED ORDER — HYDROCODONE-ACETAMINOPHEN 5-325 MG PO TABS: ORAL_TABLET | ORAL | Status: DC

## 2016-03-14 MED ORDER — PREDNISONE 20 MG PO TABS: ORAL_TABLET | ORAL | Status: DC

## 2016-03-14 NOTE — ED Notes (Signed)
Pt c/o severe groin pain that radiates to left hip and knee.  Pt did fall about a month ago.

## 2016-03-14 NOTE — ED Provider Notes (Signed)
CSN: [id number]     Arrival date & time 03/14/16  1404 History   First MD Initiated Contact with Patient 03/14/16 1448     Chief Complaint  Patient presents with  . Groin Pain      HPI Comments: Patient reports that she developed pain in her left groin area in October, 2016.  One month ago she tripped and fell resulting in increased groin pain.  The pain now radiates to her left hip and knee.  No back pain.  Her pain is worse when walking and climbing out of a car.  Patient is a 65 y.o. female presenting with groin pain. The history is provided by the patient.  Groin Pain This is a chronic problem. Episode onset: 6 months ago. The problem occurs constantly. Associated symptoms comments: Left hip pain. The symptoms are aggravated by walking. Nothing relieves the symptoms. Treatments tried: Meloxicam and Zanaflex. The treatment provided mild relief.    Past Medical History  Diagnosis Date  . Breast cancer ([place]) 2010    left  . Diabetes mellitus   . Breast infection     left breast  . Diverticulosis   . Esophageal stricture   . GERD (gastroesophageal reflux disease)   . Chest pain 09/24/2015  . Shortness of breath 09/24/2015  . Palpitations 09/24/2015   Past Surgical History  Procedure Laterality Date  . Breast lumpectomy  2010  . Abdominal hysterectomy    . Cholecystectomy    . Appendectomy    . Tonsillectomy    . Tubal ligation    . Fracture surgery      feet  . Colonoscopy    . Hand surgery    . Cystectomy      on thyroid   Family History  Problem Relation Age of Onset  . Cancer Mother     colon  . Diabetes Mother   . Atrial fibrillation Father   . CAD Father    Social History  Substance Use Topics  . Smoking status: Former Smoker    Quit date: 12/01/2003  . Smokeless tobacco: Never Used  . Alcohol Use: No   OB History    No data available     Review of Systems  All other systems reviewed and are negative.   Allergies  Naproxen  Home Medications    Prior to Admission medications   Medication Sig Start Date End Date Taking? Authorizing Provider  benazepril (LOTENSIN) 10 MG tablet Take 10 mg by mouth daily. 06/24/15   Historical Provider, MD  celecoxib (CELEBREX) 100 MG capsule Take 1 capsule (100 mg total) by mouth 2 (two) times daily. 09/19/15   Brunetta Jeans, PA-C  cephALEXin [facility]) 500 MG capsule Reported on 12/19/2015 12/06/15   Historical Provider, MD  ciprofloxacin (CIPRO) 500 MG tablet Take 1 tablet (500 mg total) by mouth 2 (two) times daily. Patient not taking: Reported on 12/19/2015 12/10/15   Trula Slade, DPM  Famotidine (PEPCID PO) Take 50 mg by mouth 2 (two) times daily.      Historical Provider, MD  fluconazole (DIFLUCAN) 150 MG tablet Take 1 tablet (150 mg total) by mouth once. 11/13/13   Amy Milda Smart, PA-C  fluconazole (DIFLUCAN) 150 MG tablet Take 1 tablet (150 mg total) by mouth daily. 12/19/15   Wallene Huh, DPM  glimepiride (AMARYL) 4 MG tablet Take 4 mg by mouth daily. 06/18/15   Historical Provider, MD  HYDROcodone-acetaminophen (NORCO/VICODIN) 5-325 MG tablet Take one tab by mouth at  bedtime as needed for pain 03/14/16   Kandra Nicolas, MD  LEVEMIR FLEXTOUCH 100 UNIT/ML Pen Inject 35 Units into the skin daily. 08/30/15   Historical Provider, MD  meloxicam (MOBIC) 15 MG tablet Take 15 mg by mouth daily. 06/18/15   Historical Provider, MD  metFORMIN (GLUCOPHAGE) 500 MG tablet Take 1,000 mg by mouth 2 (two) times daily. 06/26/15   Historical Provider, MD  metoprolol succinate (TOPROL-XL) 25 MG 24 hr tablet Take 1 tablet (25 mg total) by mouth daily. Take with or immediately following a meal. 10/10/15   Skeet Latch, MD  ondansetron (ZOFRAN) 4 MG tablet Take 1,000 mg by mouth 2 (two) times daily. 07/23/15   Historical Provider, MD  ONE TOUCH ULTRA TEST test strip Apply 1 strip topically daily. Use one strip to test BG each morning 07/10/15   Historical Provider, MD  predniSONE (DELTASONE) 20 MG tablet Take one tab by  mouth twice daily for 4 days, then one daily.  Take with food. 03/14/16   Kandra Nicolas, MD  tiZANidine (ZANAFLEX) 4 MG tablet Take 4 mg by mouth 2 (two) times daily. 08/16/15   Historical Provider, MD  TRULICITY 1.5 0000000 SOPN Inject 1.5 mg into the skin once a week. 06/20/15   Historical Provider, MD  venlafaxine XR (EFFEXOR-XR) 75 MG 24 hr capsule Take 75 mg by mouth daily. 08/16/15   Historical Provider, MD   Meds Ordered and Administered this Visit  Medications - No data to display  BP 165/92 mmHg  Pulse 68  Temp(Src) 98.5 F (36.9 C) (Oral)  Ht 5\' 5"  (1.651 m)  Wt 190 lb (86.183 kg)  BMI 31.62 kg/m2  SpO2 92% No data found.   Physical Exam  Constitutional: She is oriented to person, place, and time. She appears well-developed and well-nourished. No distress.  HENT:  Head: Normocephalic.  Mouth/Throat: Oropharynx is clear and moist.  Eyes: Conjunctivae are normal. Pupils are equal, round, and reactive to light.  Neck: Normal range of motion.  Cardiovascular: Normal heart sounds.   Pulmonary/Chest: Breath sounds normal.  Abdominal: Soft. She exhibits no mass. There is no tenderness. There is no guarding.  Musculoskeletal: She exhibits no edema.       Legs: Patient has distinct tenderness over left aspect of symphysis pubis.  Palpation there during resisted lateral adduction of the left hip recreates her groin pain.  Left hip reveals distinct tenderness over the greater trochanter.  Palpating the greater trochanter during resisted lateral abduction of the hip recreates her pain.   Neurological: She is alert and oriented to person, place, and time.  Skin: Skin is warm and dry. No rash noted.  Nursing note and vitals reviewed.   ED Course  Procedures none  Imaging Review Dg Hip Unilat With Pelvis 2-3 Views Left  03/14/2016  CLINICAL DATA:  Right hip pain following a fall 1 month ago. EXAM: DG HIP (WITH OR WITHOUT PELVIS) 2-3V LEFT COMPARISON:  None. FINDINGS: Mild  bilateral femoral head and neck junction spur formation. No fracture or dislocation seen. Lower lumbar spine degenerative changes. Diffuse osteopenia. IMPRESSION: 1. No fracture or dislocation seen. 2. Mild bilateral hip degenerative changes. 3. Lower lumbar spine degenerative changes. Electronically Signed   By: Claudie Revering M.D.   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 Trochanteric bursitis of left hip    Begin prednisone burst/taper.  Lortab for pain at night. May continue Zanaflex. Apply ice  pack for 20 to 30 minutes, 3 to 4 times daily  Continue until pain decreases.  Begin left hip range of motion and stretching exercises as tolerated. Followup with Dr. Aundria Mems or Dr. Lynne Leader ([facility]) for further management.                    Kandra Nicolas, MD 03/16/16 1054

## 2016-03-14 NOTE — Discharge Instructions (Signed)
Apply ice pack for 20 to 30 minutes, 3 to 4 times daily  Continue until pain decreases.  Begin left hip range of motion and stretching exercises as tolerated.   Hip Bursitis Bursitis is a swelling and soreness (inflammation) of a fluid-filled sac (bursa). This sac overlies and protects the joints.  CAUSES   Injury.  Overuse of the muscles surrounding the joint.  Arthritis.  Gout.  Infection.  Cold weather.  Inadequate warm-up and conditioning prior to activities. The cause may not be known.  SYMPTOMS   Mild to severe irritation.  Tenderness and swelling over the outside of the hip.  Pain with motion of the hip.  If the bursa becomes infected, a fever may be present. Redness, tenderness, and warmth will develop over the hip. Symptoms usually lessen in 3 to 4 weeks with treatment, but can come back. TREATMENT If conservative treatment does not work, your caregiver may advise draining the bursa and injecting cortisone into the area. This may speed up the healing process. This may also be used as an initial treatment of choice. HOME CARE INSTRUCTIONS   Apply ice to the affected area for 15-20 minutes every 3 to 4 hours while awake for the first 2 days. Put the ice in a plastic bag and place a towel between the bag of ice and your skin.  Rest the painful joint as much as possible, but continue to put the joint through a normal range of motion at least 4 times per day. When the pain lessens, begin normal, slow movements and usual activities to help prevent stiffness of the hip.  Only take over-the-counter or prescription medicines for pain, discomfort, or fever as directed by your caregiver.  Use crutches to limit weight bearing on the hip joint, if advised.  Elevate your painful hip to reduce swelling. Use pillows for propping and cushioning your legs and hips.  Gentle massage may provide comfort and decrease swelling. SEEK IMMEDIATE MEDICAL CARE IF:   Your pain increases  even during treatment, or you are not improving.  You have a fever.  You have heat and inflammation over the involved bursa.  You have any other questions or concerns. MAKE SURE YOU:   Understand these instructions.  Will watch your condition.  Will get help right away if you are not doing well or get worse.   This information is not intended to replace advice given to you by your health care provider. Make sure you discuss any questions you have with your health care provider.   Document Released: 05/08/2002 Document Revised: 02/08/2012 Document Reviewed: 06/18/2015 Elsevier Interactive Patient Education [employer].

## 2016-04-03 ENCOUNTER — Other Ambulatory Visit: Payer: Self-pay | Admitting: Physician Assistant

## 2016-04-03 DIAGNOSIS — M1612 Unilateral primary osteoarthritis, left hip: Secondary | ICD-10-CM

## 2016-04-09 ENCOUNTER — Ambulatory Visit
Admission: RE | Admit: 2016-04-09 | Discharge: 2016-04-09 | Disposition: A | Discharge status: Home / Self Care | Payer: Medicare Other | Source: Ambulatory Visit | Attending: Physician Assistant | Admitting: Physician Assistant

## 2016-04-09 DIAGNOSIS — M1612 Unilateral primary osteoarthritis, left hip: Secondary | ICD-10-CM

## 2016-04-18 ENCOUNTER — Other Ambulatory Visit: Payer: Self-pay | Admitting: Family Medicine

## 2016-04-20 NOTE — Telephone Encounter (Signed)
Pt is requesting a refill. 

## 2016-04-20 NOTE — Telephone Encounter (Signed)
Cassandra Johnston was one of my patients at [place]. Please ask pharmacy to send request to [facility]; her new PCP should be able to fill it. Thanks, BJ

## 2016-05-08 ENCOUNTER — Other Ambulatory Visit: Payer: Self-pay | Admitting: Family Medicine

## 2016-06-09 ENCOUNTER — Other Ambulatory Visit: Payer: Self-pay | Admitting: Family Medicine

## 2016-07-02 ENCOUNTER — Other Ambulatory Visit: Payer: Self-pay | Admitting: Physician Assistant

## 2016-07-08 ENCOUNTER — Encounter (HOSPITAL_COMMUNITY): Payer: Self-pay

## 2016-07-08 NOTE — Patient Instructions (Addendum)
Cassandra Johnston  07/08/2016   Your procedure is scheduled on: 07/17/2016    Report to [facility] Main  Entrance take [place]  elevators to 3rd floor to  [place] at    [place] AM.  Call this number if you have problems the morning of surgery [phone]   Remember: ONLY 1 PERSON MAY GO WITH YOU TO SHORT STAY TO GET  READY MORNING OF [place].  Do not eat food or drink liquids :After Midnight.             Eat a good healthy snack prior to bedtime.      Take these medicines the morning of surgery with A SIP OF WATER: Tylenol # 3 if needed, pepcid, Effexor, Metoprolol ( Toprol)             No INSULIN AM OF SURGERY. Take 1/2 of evening dose of Insulin nite before surgery.  DO NOT TAKE ANY DIABETIC MEDICATIONS DAY OF YOUR SURGERY                               You may not have any metal on your body including hair pins and              piercings  Do not wear jewelry, make-up, lotions, powders or perfumes, deodorant             Do not wear nail polish.  Do not shave  48 hours prior to surgery.                Do not bring valuables to the hospital. [place].  Contacts, dentures or bridgework may not be worn into surgery.  Leave suitcase in the car. After surgery it may be brought to your room.                   Special Instructions: coughing and deep breathing exercises, leg exercises              Please read over the following fact sheets you were given: _____________________________________________________________________             [facility] - Preparing for Surgery Before surgery, you can play an important role.  Because skin is not sterile, your skin needs to be as free of germs as possible.  You can reduce the number of germs on your skin by washing with CHG (chlorahexidine gluconate) soap before surgery.  CHG is an antiseptic cleaner which kills germs and bonds with the skin to continue killing  germs even after washing. Please DO NOT use if you have an allergy to CHG or antibacterial soaps.  If your skin becomes reddened/irritated stop using the CHG and inform your nurse when you arrive at Short Stay. Do not shave (including legs and underarms) for at least 48 hours prior to the first CHG shower.  You may shave your face/neck. Please follow these instructions carefully:  1.  Shower with CHG Soap the night before surgery and the  morning of Surgery.  2.  If you choose to wash your hair, wash your hair first as usual with your  normal  shampoo.  3.  After you shampoo, rinse your hair and body thoroughly to  remove the  shampoo.                           4.  Use CHG as you would any other liquid soap.  You can apply chg directly  to the skin and wash                       Gently with a scrungie or clean washcloth.  5.  Apply the CHG Soap to your body ONLY FROM THE NECK DOWN.   Do not use on face/ open                           Wound or open sores. Avoid contact with eyes, ears mouth and genitals (private parts).                       Wash face,  Genitals (private parts) with your normal soap.             6.  Wash thoroughly, paying special attention to the area where your surgery  will be performed.  7.  Thoroughly rinse your body with warm water from the neck down.  8.  DO NOT shower/wash with your normal soap after using and rinsing off  the CHG Soap.                9.  Pat yourself dry with a clean towel.            10.  Wear clean pajamas.            11.  Place clean sheets on your bed the night of your first shower and do not  sleep with pets. Day of Surgery : Do not apply any lotions/deodorants the morning of surgery.  Please wear clean clothes to the hospital/surgery center.  FAILURE TO FOLLOW THESE INSTRUCTIONS MAY RESULT IN THE CANCELLATION OF YOUR SURGERY PATIENT SIGNATURE_________________________________  NURSE  SIGNATURE__________________________________  ________________________________________________________________________  WHAT IS A BLOOD TRANSFUSION? Blood Transfusion Information  A transfusion is the replacement of blood or some of its parts. Blood is made up of multiple cells which provide different functions.  Red blood cells carry oxygen and are used for blood loss replacement.  White blood cells fight against infection.  Platelets control bleeding.  Plasma helps clot blood.  Other blood products are available for specialized needs, such as hemophilia or other clotting disorders. BEFORE THE TRANSFUSION  Who gives blood for transfusions?   Healthy volunteers who are fully evaluated to make sure their blood is safe. This is blood bank blood. Transfusion therapy is the safest it has ever been in the practice of medicine. Before blood is taken from a donor, a complete history is taken to make sure that person has no history of diseases nor engages in risky social behavior (examples are intravenous drug use or sexual activity with multiple partners). The donor's travel history is screened to minimize risk of transmitting infections, such as malaria. The donated blood is tested for signs of infectious diseases, such as HIV and hepatitis. The blood is then tested to be sure it is compatible with you in order to minimize the chance of a transfusion reaction. If you or a relative donates blood, this is often done in anticipation of surgery and is not appropriate for emergency situations. It takes many days to process the donated blood. RISKS  AND COMPLICATIONS Although transfusion therapy is very safe and saves many lives, the main dangers of transfusion include:   Getting an infectious disease.  Developing a transfusion reaction. This is an allergic reaction to something in the blood you were given. Every precaution is taken to prevent this. The decision to have a blood transfusion has been  considered carefully by your caregiver before blood is given. Blood is not given unless the benefits outweigh the risks. AFTER THE TRANSFUSION  Right after receiving a blood transfusion, you will usually feel much better and more energetic. This is especially true if your red blood cells have gotten low (anemic). The transfusion raises the level of the red blood cells which carry oxygen, and this usually causes an energy increase.  The nurse administering the transfusion will monitor you carefully for complications. HOME CARE INSTRUCTIONS  No special instructions are needed after a transfusion. You may find your energy is better. Speak with your caregiver about any limitations on activity for underlying diseases you may have. SEEK MEDICAL CARE IF:   Your condition is not improving after your transfusion.  You develop redness or irritation at the intravenous (IV) site. SEEK IMMEDIATE MEDICAL CARE IF:  Any of the following symptoms occur over the next 12 hours:  Shaking chills.  You have a temperature by mouth above 102 F (38.9 C), not controlled by medicine.  Chest, back, or muscle pain.  People around you feel you are not acting correctly or are confused.  Shortness of breath or difficulty breathing.  Dizziness and fainting.  You get a rash or develop hives.  You have a decrease in urine output.  Your urine turns a dark color or changes to pink, red, or brown. Any of the following symptoms occur over the next 10 days:  You have a temperature by mouth above 102 F (38.9 C), not controlled by medicine.  Shortness of breath.  Weakness after normal activity.  The white part of the eye turns yellow (jaundice).  You have a decrease in the amount of urine or are urinating less often.  Your urine turns a dark color or changes to pink, red, or brown. Document Released: 11/13/2000 Document Revised: 02/08/2012 Document Reviewed: 07/02/2008 ExitCare Patient Information 2014  [place].  _______________________________________________________________________  Incentive Spirometer  An incentive spirometer is a tool that can help keep your lungs clear and active. This tool measures how well you are filling your lungs with each breath. Taking long deep breaths may help reverse or decrease the chance of developing breathing (pulmonary) problems (especially infection) following:  A long period of time when you are unable to move or be active. BEFORE THE PROCEDURE   If the spirometer includes an indicator to show your best effort, your nurse or respiratory therapist will set it to a desired goal.  If possible, sit up straight or lean slightly forward. Try not to slouch.  Hold the incentive spirometer in an upright position. INSTRUCTIONS FOR USE  1. Sit on the edge of your bed if possible, or sit up as far as you can in bed or on a chair. 2. Hold the incentive spirometer in an upright position. 3. Breathe out normally. 4. Place the mouthpiece in your mouth and seal your lips tightly around it. 5. Breathe in slowly and as deeply as possible, raising the piston or the ball toward the top of the column. 6. Hold your breath for 3-5 seconds or for as long as possible. Allow the piston or  ball to fall to the bottom of the column. 7. Remove the mouthpiece from your mouth and breathe out normally. 8. Rest for a few seconds and repeat Steps 1 through 7 at least 10 times every 1-2 hours when you are awake. Take your time and take a few normal breaths between deep breaths. 9. The spirometer may include an indicator to show your best effort. Use the indicator as a goal to work toward during each repetition. 10. After each set of 10 deep breaths, practice coughing to be sure your lungs are clear. If you have an incision (the cut made at the time of surgery), support your incision when coughing by placing a pillow or rolled up towels firmly against it. Once you are able to get  out of bed, walk around indoors and cough well. You may stop using the incentive spirometer when instructed by your caregiver.  RISKS AND COMPLICATIONS  Take your time so you do not get dizzy or light-headed.  If you are in pain, you may need to take or ask for pain medication before doing incentive spirometry. It is harder to take a deep breath if you are having pain. AFTER USE  Rest and breathe slowly and easily.  It can be helpful to keep track of a log of your progress. Your caregiver can provide you with a simple table to help with this. If you are using the spirometer at home, follow these instructions: [place] IF:   You are having difficultly using the spirometer.  You have trouble using the spirometer as often as instructed.  Your pain medication is not giving enough relief while using the spirometer.  You develop fever of 100.5 F (38.1 C) or higher. SEEK IMMEDIATE MEDICAL CARE IF:   You cough up bloody sputum that had not been present before.  You develop fever of 102 F (38.9 C) or greater.  You develop worsening pain at or near the incision site. MAKE SURE YOU:   Understand these instructions.  Will watch your condition.  Will get help right away if you are not doing well or get worse. Document Released: 03/29/2007 Document Revised: 02/08/2012 Document Reviewed: 05/30/2007 [facility] Patient Information 2014 [place], [place].   ________________________________________________________________________

## 2016-07-10 ENCOUNTER — Encounter (HOSPITAL_COMMUNITY): Payer: Self-pay

## 2016-07-10 ENCOUNTER — Encounter (HOSPITAL_COMMUNITY)
Admission: RE | Admit: 2016-07-10 | Discharge: 2016-07-10 | Disposition: A | Discharge status: Home / Self Care | Payer: Medicare Other | Source: Ambulatory Visit | Attending: Orthopaedic Surgery | Admitting: Orthopaedic Surgery

## 2016-07-10 DIAGNOSIS — Z01812 Encounter for preprocedural laboratory examination: Secondary | ICD-10-CM | POA: Diagnosis present

## 2016-07-10 HISTORY — DX: Unspecified osteoarthritis, unspecified site: M19.90

## 2016-07-10 HISTORY — DX: Major depressive disorder, single episode, unspecified: F32.9

## 2016-07-10 HISTORY — DX: Other complications of anesthesia, initial encounter: T88.59XA

## 2016-07-10 HISTORY — DX: Depression, unspecified: F32.A

## 2016-07-10 HISTORY — DX: Other specified postprocedural states: R11.2

## 2016-07-10 HISTORY — DX: Other specified postprocedural states: Z98.890

## 2016-07-10 HISTORY — DX: Pneumonia, unspecified organism: J18.9

## 2016-07-10 HISTORY — DX: Essential (primary) hypertension: I10

## 2016-07-10 HISTORY — DX: Family history of other specified conditions: Z84.89

## 2016-07-10 HISTORY — DX: Adverse effect of unspecified anesthetic, initial encounter: T41.45XA

## 2016-07-10 HISTORY — DX: Anxiety disorder, unspecified: F41.9

## 2016-07-10 HISTORY — DX: Cardiac murmur, unspecified: R01.1

## 2016-07-10 LAB — CBC
HCT: 40.8 % (ref 36.0–46.0)
Hemoglobin: 13.7 g/dL (ref 12.0–15.0)
MCH: 30.1 pg (ref 26.0–34.0)
MCHC: 33.6 g/dL (ref 30.0–36.0)
MCV: 89.7 fL (ref 78.0–100.0)
PLATELETS: 286 10*3/uL (ref 150–400)
RBC: 4.55 MIL/uL (ref 3.87–5.11)
RDW: 13.4 % (ref 11.5–15.5)
WBC: 12.4 10*3/uL — ABNORMAL HIGH (ref 4.0–10.5)

## 2016-07-10 LAB — ABO/RH: ABO/RH(D): A POS

## 2016-07-10 LAB — BASIC METABOLIC PANEL
Anion gap: 10 (ref 5–15)
BUN: 11 mg/dL (ref 6–20)
CALCIUM: 9.5 mg/dL (ref 8.9–10.3)
CO2: 26 mmol/L (ref 22–32)
CREATININE: 0.56 mg/dL (ref 0.44–1.00)
Chloride: 101 mmol/L (ref 101–111)
GFR calc Af Amer: >60 mL/min (ref >60)
GLUCOSE: 147 mg/dL — AB (ref 65–99)
POTASSIUM: 4.2 mmol/L (ref 3.5–5.1)
Sodium: 137 mmol/L (ref 135–145)

## 2016-07-10 LAB — SURGICAL PCR SCREEN
MRSA, PCR: NEGATIVE
Staphylococcus aureus: NEGATIVE

## 2016-07-10 NOTE — Progress Notes (Signed)
CBC done 07/10/16- faxed via EPIC to Dr Zollie Beckers.

## 2016-07-10 NOTE — Progress Notes (Signed)
EKG- 09/24/15- EPIC  10/29/15- stress- EPIC  10/02/15- ECHO-EPIC

## 2016-07-11 LAB — HEMOGLOBIN A1C
Hgb A1c MFr Bld: 8.5 % — ABNORMAL HIGH (ref 4.8–5.6)
Mean Plasma Glucose: 197 mg/dL

## 2016-07-16 NOTE — Anesthesia Preprocedure Evaluation (Addendum)
Anesthesia Evaluation  Patient identified by MRN, date of birth, ID band Patient awake    Reviewed: Allergy & Precautions, NPO status , Patient's Chart, lab work & pertinent test results  History of Anesthesia Complications (+) PONV and history of anesthetic complications  Airway Mallampati: II  TM Distance: >3 FB Neck ROM: Full    Dental no notable dental hx.    Pulmonary former smoker,    Pulmonary exam normal        Cardiovascular hypertension, Pt. on medications Normal cardiovascular exam  Echo showing EF [id number], grade 1 diastolic dysfunction.  Normal stress test   Neuro/Psych Anxiety    GI/Hepatic GERD  Medicated,  Endo/Other  diabetes, Type 2  Renal/GU      Musculoskeletal  (+) Arthritis ,   Abdominal   Peds  Hematology   Anesthesia Other Findings   Reproductive/Obstetrics                            Anesthesia Physical Anesthesia Plan  ASA: III  Anesthesia Plan: Spinal   Post-op Pain Management:    Induction:   Airway Management Planned: Nasal Cannula  Additional Equipment: None  Intra-op Plan:   Post-operative Plan:   Informed Consent:   Plan Discussed with:   Anesthesia Plan Comments:        Anesthesia Quick Evaluation

## 2016-07-17 ENCOUNTER — Inpatient Hospital Stay (HOSPITAL_COMMUNITY): Payer: Medicare Other

## 2016-07-17 ENCOUNTER — Inpatient Hospital Stay (HOSPITAL_COMMUNITY): Payer: Medicare Other | Admitting: Anesthesiology

## 2016-07-17 ENCOUNTER — Encounter (HOSPITAL_COMMUNITY): Payer: Self-pay | Admitting: *Deleted

## 2016-07-17 ENCOUNTER — Encounter (HOSPITAL_COMMUNITY)
Admission: RE | Disposition: A | Discharge status: Home Health | Payer: Self-pay | Source: Ambulatory Visit | Attending: Orthopaedic Surgery

## 2016-07-17 ENCOUNTER — Inpatient Hospital Stay (HOSPITAL_COMMUNITY)
Admission: RE | Admit: 2016-07-17 | Discharge: 2016-07-19 | DRG: 470 | Disposition: A | Discharge status: Home Health | Payer: Medicare Other | Source: Ambulatory Visit | Attending: Orthopaedic Surgery | Admitting: Orthopaedic Surgery

## 2016-07-17 DIAGNOSIS — K219 Gastro-esophageal reflux disease without esophagitis: Secondary | ICD-10-CM | POA: Diagnosis present

## 2016-07-17 DIAGNOSIS — Z882 Allergy status to sulfonamides status: Secondary | ICD-10-CM

## 2016-07-17 DIAGNOSIS — E785 Hyperlipidemia, unspecified: Secondary | ICD-10-CM | POA: Diagnosis present

## 2016-07-17 DIAGNOSIS — Z419 Encounter for procedure for purposes other than remedying health state, unspecified: Secondary | ICD-10-CM

## 2016-07-17 DIAGNOSIS — F419 Anxiety disorder, unspecified: Secondary | ICD-10-CM | POA: Diagnosis present

## 2016-07-17 DIAGNOSIS — Z96642 Presence of left artificial hip joint: Secondary | ICD-10-CM

## 2016-07-17 DIAGNOSIS — Z888 Allergy status to other drugs, medicaments and biological substances status: Secondary | ICD-10-CM

## 2016-07-17 DIAGNOSIS — M1612 Unilateral primary osteoarthritis, left hip: Secondary | ICD-10-CM

## 2016-07-17 DIAGNOSIS — Z86010 Personal history of colonic polyps: Secondary | ICD-10-CM | POA: Diagnosis not present

## 2016-07-17 DIAGNOSIS — M25552 Pain in left hip: Secondary | ICD-10-CM | POA: Diagnosis present

## 2016-07-17 DIAGNOSIS — E119 Type 2 diabetes mellitus without complications: Secondary | ICD-10-CM | POA: Diagnosis present

## 2016-07-17 DIAGNOSIS — Z853 Personal history of malignant neoplasm of breast: Secondary | ICD-10-CM | POA: Diagnosis not present

## 2016-07-17 DIAGNOSIS — Z87891 Personal history of nicotine dependence: Secondary | ICD-10-CM | POA: Diagnosis not present

## 2016-07-17 DIAGNOSIS — I10 Essential (primary) hypertension: Secondary | ICD-10-CM | POA: Diagnosis present

## 2016-07-17 DIAGNOSIS — Z833 Family history of diabetes mellitus: Secondary | ICD-10-CM

## 2016-07-17 HISTORY — DX: Unilateral primary osteoarthritis, left hip: M16.12

## 2016-07-17 HISTORY — PX: TOTAL HIP ARTHROPLASTY: SHX124

## 2016-07-17 LAB — TYPE AND SCREEN
ABO/RH(D): A POS
Antibody Screen: NEGATIVE

## 2016-07-17 LAB — GLUCOSE, CAPILLARY
GLUCOSE-CAPILLARY: 175 mg/dL — AB (ref 65–99)
Glucose-Capillary: 213 mg/dL — ABNORMAL HIGH (ref 65–99)
Glucose-Capillary: 154 mg/dL — ABNORMAL HIGH (ref 65–99)
Glucose-Capillary: 165 mg/dL — ABNORMAL HIGH (ref 65–99)
Glucose-Capillary: 209 mg/dL — ABNORMAL HIGH (ref 65–99)

## 2016-07-17 SURGERY — ARTHROPLASTY, HIP, TOTAL, ANTERIOR APPROACH
Anesthesia: Spinal | Site: Hip | Laterality: Left

## 2016-07-17 MED ORDER — VENLAFAXINE HCL ER 75 MG PO CP24
75.0000 mg | ORAL_CAPSULE | Freq: Every day | ORAL | Status: DC
  Administered 2016-07-18 – 2016-07-19 (×2): 75 mg via ORAL
  Filled 2016-07-17 (×2): qty 1

## 2016-07-17 MED ORDER — MIDAZOLAM HCL 2 MG/2ML IJ SOLN
INTRAMUSCULAR | Status: AC
  Filled 2016-07-17: qty 2

## 2016-07-17 MED ORDER — PROMETHAZINE HCL 25 MG/ML IJ SOLN: 6.2500 mg | INTRAMUSCULAR | Status: DC | PRN

## 2016-07-17 MED ORDER — METOCLOPRAMIDE HCL 5 MG/ML IJ SOLN: 5.0000 mg | Freq: Three times a day (TID) | INTRAMUSCULAR | Status: DC | PRN

## 2016-07-17 MED ORDER — METOCLOPRAMIDE HCL 5 MG PO TABS: 5.0000 mg | ORAL_TABLET | Freq: Three times a day (TID) | ORAL | Status: DC | PRN

## 2016-07-17 MED ORDER — PRAVASTATIN SODIUM 20 MG PO TABS
10.0000 mg | ORAL_TABLET | Freq: Every evening | ORAL | Status: DC
  Administered 2016-07-17 – 2016-07-18 (×2): 10 mg via ORAL
  Filled 2016-07-17 (×2): qty 1

## 2016-07-17 MED ORDER — FENTANYL CITRATE (PF) 100 MCG/2ML IJ SOLN
INTRAMUSCULAR | Status: AC
  Filled 2016-07-17: qty 2

## 2016-07-17 MED ORDER — SODIUM CHLORIDE 0.9 % IR SOLN
Status: DC | PRN
  Administered 2016-07-17: 1000 mL

## 2016-07-17 MED ORDER — 0.9 % SODIUM CHLORIDE (POUR BTL) OPTIME
TOPICAL | Status: DC | PRN
  Administered 2016-07-17: 1000 mL

## 2016-07-17 MED ORDER — ASPIRIN EC 325 MG PO TBEC
325.0000 mg | DELAYED_RELEASE_TABLET | Freq: Two times a day (BID) | ORAL | Status: DC
  Administered 2016-07-17 – 2016-07-19 (×4): 325 mg via ORAL
  Filled 2016-07-17 (×4): qty 1

## 2016-07-17 MED ORDER — PROPOFOL 10 MG/ML IV BOLUS
INTRAVENOUS | Status: AC
  Filled 2016-07-17: qty 40

## 2016-07-17 MED ORDER — MIDAZOLAM HCL 5 MG/5ML IJ SOLN
INTRAMUSCULAR | Status: DC | PRN
  Administered 2016-07-17: 2 mg via INTRAVENOUS

## 2016-07-17 MED ORDER — HYDROMORPHONE HCL 1 MG/ML IJ SOLN
1.0000 mg | INTRAMUSCULAR | Status: DC | PRN
  Administered 2016-07-17 – 2016-07-18 (×6): 1 mg via INTRAVENOUS
  Filled 2016-07-17 (×6): qty 1

## 2016-07-17 MED ORDER — CEFAZOLIN SODIUM-DEXTROSE 2-4 GM/100ML-% IV SOLN
2.0000 g | INTRAVENOUS | Status: AC
  Administered 2016-07-17: 2 g via INTRAVENOUS
  Filled 2016-07-17: qty 100

## 2016-07-17 MED ORDER — INSULIN GLARGINE 100 UNIT/ML ~~LOC~~ SOLN
40.0000 [IU] | Freq: Every day | SUBCUTANEOUS | Status: DC
  Administered 2016-07-18 – 2016-07-19 (×2): 40 [IU] via SUBCUTANEOUS
  Filled 2016-07-17 (×2): qty 0.4

## 2016-07-17 MED ORDER — METFORMIN HCL 500 MG PO TABS
1000.0000 mg | ORAL_TABLET | Freq: Two times a day (BID) | ORAL | Status: DC
  Administered 2016-07-17 – 2016-07-19 (×4): 1000 mg via ORAL
  Filled 2016-07-17 (×4): qty 2

## 2016-07-17 MED ORDER — PROPOFOL 10 MG/ML IV BOLUS
INTRAVENOUS | Status: AC
  Filled 2016-07-17: qty 20

## 2016-07-17 MED ORDER — OXYCODONE HCL 5 MG PO TABS
5.0000 mg | ORAL_TABLET | ORAL | Status: DC | PRN
  Administered 2016-07-17: 15 mg via ORAL
  Administered 2016-07-17: 5 mg via ORAL
  Administered 2016-07-17: 15 mg via ORAL
  Administered 2016-07-17: 10 mg via ORAL
  Administered 2016-07-18 – 2016-07-19 (×6): 15 mg via ORAL
  Filled 2016-07-17 (×4): qty 3
  Filled 2016-07-17: qty 2
  Filled 2016-07-17 (×3): qty 3
  Filled 2016-07-17: qty 1
  Filled 2016-07-17: qty 3

## 2016-07-17 MED ORDER — LACTATED RINGERS IV SOLN: INTRAVENOUS | Status: DC

## 2016-07-17 MED ORDER — ACETAMINOPHEN 325 MG PO TABS
650.0000 mg | ORAL_TABLET | Freq: Once | ORAL | Status: AC
  Administered 2016-07-17: 650 mg via ORAL

## 2016-07-17 MED ORDER — FENTANYL CITRATE (PF) 100 MCG/2ML IJ SOLN
25.0000 ug | INTRAMUSCULAR | Status: DC | PRN
  Administered 2016-07-17 (×4): 25 ug via INTRAVENOUS

## 2016-07-17 MED ORDER — PHENYLEPHRINE HCL 10 MG/ML IJ SOLN
INTRAMUSCULAR | Status: DC | PRN
  Administered 2016-07-17: 30 ug/min via INTRAVENOUS

## 2016-07-17 MED ORDER — ONDANSETRON HCL 4 MG PO TABS: 4.0000 mg | ORAL_TABLET | Freq: Four times a day (QID) | ORAL | Status: DC | PRN

## 2016-07-17 MED ORDER — ONDANSETRON HCL 4 MG PO TABS
4.0000 mg | ORAL_TABLET | Freq: Two times a day (BID) | ORAL | Status: DC
  Administered 2016-07-17 – 2016-07-19 (×4): 4 mg via ORAL
  Filled 2016-07-17 (×4): qty 1

## 2016-07-17 MED ORDER — ACETAMINOPHEN 650 MG RE SUPP: 650.0000 mg | Freq: Four times a day (QID) | RECTAL | Status: DC | PRN

## 2016-07-17 MED ORDER — SODIUM CHLORIDE 0.9 % IV SOLN
INTRAVENOUS | Status: DC
  Administered 2016-07-17: 75 mL/h via INTRAVENOUS
  Administered 2016-07-18: 06:00:00 via INTRAVENOUS

## 2016-07-17 MED ORDER — METOPROLOL SUCCINATE ER 25 MG PO TB24
25.0000 mg | ORAL_TABLET | ORAL | Status: AC
  Administered 2016-07-17: 25 mg via ORAL
  Filled 2016-07-17: qty 1

## 2016-07-17 MED ORDER — PROPOFOL 500 MG/50ML IV EMUL
INTRAVENOUS | Status: DC | PRN
  Administered 2016-07-17: 50 ug/kg/min via INTRAVENOUS

## 2016-07-17 MED ORDER — INSULIN ASPART PROT & ASPART (70-30 MIX) 100 UNIT/ML ~~LOC~~ SUSP
30.0000 [IU] | Freq: Every day | SUBCUTANEOUS | Status: DC
  Administered 2016-07-18: 30 [IU] via SUBCUTANEOUS
  Filled 2016-07-17: qty 10

## 2016-07-17 MED ORDER — METHOCARBAMOL 1000 MG/10ML IJ SOLN
500.0000 mg | Freq: Four times a day (QID) | INTRAVENOUS | Status: DC | PRN
  Administered 2016-07-17: 500 mg via INTRAVENOUS
  Filled 2016-07-17: qty 5
  Filled 2016-07-17: qty 550

## 2016-07-17 MED ORDER — SODIUM CHLORIDE 0.9 % IV SOLN
1000.0000 mg | INTRAVENOUS | Status: AC
  Administered 2016-07-17: 1000 mg via INTRAVENOUS
  Filled 2016-07-17: qty 1100

## 2016-07-17 MED ORDER — CHLORHEXIDINE GLUCONATE 4 % EX LIQD: 60.0000 mL | Freq: Once | CUTANEOUS | Status: DC

## 2016-07-17 MED ORDER — CEFAZOLIN SODIUM-DEXTROSE 2-4 GM/100ML-% IV SOLN
INTRAVENOUS | Status: AC
  Filled 2016-07-17: qty 100

## 2016-07-17 MED ORDER — FAMOTIDINE 20 MG PO TABS
40.0000 mg | ORAL_TABLET | Freq: Two times a day (BID) | ORAL | Status: DC
  Administered 2016-07-17 – 2016-07-19 (×4): 40 mg via ORAL
  Filled 2016-07-17 (×4): qty 2

## 2016-07-17 MED ORDER — ALUM & MAG HYDROXIDE-SIMETH 200-200-20 MG/5ML PO SUSP
30.0000 mL | ORAL | Status: DC | PRN
  Administered 2016-07-18 – 2016-07-19 (×3): 30 mL via ORAL
  Filled 2016-07-17 (×3): qty 30

## 2016-07-17 MED ORDER — DIPHENHYDRAMINE HCL 12.5 MG/5ML PO ELIX
12.5000 mg | ORAL_SOLUTION | ORAL | Status: DC | PRN
  Administered 2016-07-19: 12.5 mg via ORAL
  Filled 2016-07-17: qty 5

## 2016-07-17 MED ORDER — BENAZEPRIL HCL 10 MG PO TABS
10.0000 mg | ORAL_TABLET | Freq: Every day | ORAL | Status: DC
  Administered 2016-07-19: 10 mg via ORAL
  Filled 2016-07-17 (×2): qty 1

## 2016-07-17 MED ORDER — ACETAMINOPHEN 325 MG PO TABS: 650.0000 mg | ORAL_TABLET | Freq: Four times a day (QID) | ORAL | Status: DC | PRN

## 2016-07-17 MED ORDER — MENTHOL 3 MG MT LOZG: 1.0000 | LOZENGE | OROMUCOSAL | Status: DC | PRN

## 2016-07-17 MED ORDER — FENTANYL CITRATE (PF) 100 MCG/2ML IJ SOLN
INTRAMUSCULAR | Status: DC | PRN
  Administered 2016-07-17 (×2): 50 ug via INTRAVENOUS

## 2016-07-17 MED ORDER — EPHEDRINE SULFATE 50 MG/ML IJ SOLN
INTRAMUSCULAR | Status: DC | PRN
  Administered 2016-07-17: 5 mg via INTRAVENOUS
  Administered 2016-07-17: 10 mg via INTRAVENOUS
  Administered 2016-07-17: 15 mg via INTRAVENOUS
  Administered 2016-07-17: 5 mg via INTRAVENOUS
  Administered 2016-07-17: 10 mg via INTRAVENOUS

## 2016-07-17 MED ORDER — DOCUSATE SODIUM 100 MG PO CAPS
100.0000 mg | ORAL_CAPSULE | Freq: Two times a day (BID) | ORAL | Status: DC
  Administered 2016-07-17 – 2016-07-19 (×4): 100 mg via ORAL
  Filled 2016-07-17 (×4): qty 1

## 2016-07-17 MED ORDER — LACTATED RINGERS IV SOLN
INTRAVENOUS | Status: DC
  Administered 2016-07-17 (×3): via INTRAVENOUS

## 2016-07-17 MED ORDER — CEFAZOLIN IN D5W 1 GM/50ML IV SOLN
1.0000 g | Freq: Four times a day (QID) | INTRAVENOUS | Status: AC
  Administered 2016-07-17 (×2): 1 g via INTRAVENOUS
  Filled 2016-07-17 (×2): qty 50

## 2016-07-17 MED ORDER — PHENYLEPHRINE HCL 10 MG/ML IJ SOLN
INTRAMUSCULAR | Status: DC | PRN
  Administered 2016-07-17: 80 ug via INTRAVENOUS

## 2016-07-17 MED ORDER — ACETAMINOPHEN 325 MG PO TABS
ORAL_TABLET | ORAL | Status: AC
  Filled 2016-07-17: qty 2

## 2016-07-17 MED ORDER — ZOLPIDEM TARTRATE 5 MG PO TABS: 5.0000 mg | ORAL_TABLET | Freq: Every evening | ORAL | Status: DC | PRN

## 2016-07-17 MED ORDER — PHENOL 1.4 % MT LIQD
1.0000 | OROMUCOSAL | Status: DC | PRN
Start: 2016-07-17 — End: 2016-07-19

## 2016-07-17 MED ORDER — GLYCOPYRROLATE 0.2 MG/ML IJ SOLN
INTRAMUSCULAR | Status: DC | PRN
  Administered 2016-07-17: 0.2 mg via INTRAVENOUS

## 2016-07-17 MED ORDER — ONDANSETRON HCL 4 MG/2ML IJ SOLN: 4.0000 mg | Freq: Four times a day (QID) | INTRAMUSCULAR | Status: DC | PRN

## 2016-07-17 MED ORDER — METOPROLOL SUCCINATE ER 25 MG PO TB24
25.0000 mg | ORAL_TABLET | Freq: Every day | ORAL | Status: DC
  Administered 2016-07-18: 25 mg via ORAL
  Filled 2016-07-17 (×3): qty 1

## 2016-07-17 MED ORDER — POLYETHYLENE GLYCOL 3350 17 G PO PACK: 17.0000 g | PACK | Freq: Every day | ORAL | Status: DC | PRN

## 2016-07-17 MED ORDER — METHOCARBAMOL 500 MG PO TABS
500.0000 mg | ORAL_TABLET | Freq: Four times a day (QID) | ORAL | Status: DC | PRN
  Administered 2016-07-17 – 2016-07-18 (×2): 500 mg via ORAL
  Filled 2016-07-17 (×2): qty 1

## 2016-07-17 MED ORDER — STERILE WATER FOR IRRIGATION IR SOLN
Status: DC | PRN
  Administered 2016-07-17: 2000 mL

## 2016-07-17 SURGICAL SUPPLY — 43 items
BAG ZIPLOCK 12X15 (MISCELLANEOUS) ×3 IMPLANT
BENZOIN TINCTURE PRP APPL 2/3 (GAUZE/BANDAGES/DRESSINGS) ×3 IMPLANT
BLADE SAW SGTL 18X1.27X75 (BLADE) ×2 IMPLANT
BLADE SAW SGTL 18X1.27X75MM (BLADE) ×1
CAPT HIP TOTAL 2 ×3 IMPLANT
CELLS DAT CNTRL 66122 CELL SVR (MISCELLANEOUS) ×1 IMPLANT
CLOSURE WOUND 1/2 X4 (GAUZE/BANDAGES/DRESSINGS) ×1
CLOTH BEACON ORANGE TIMEOUT ST (SAFETY) ×3 IMPLANT
CUP SECTOR GRIPTON 50MM (Cup) IMPLANT
DRAPE STERI IOBAN 125X83 (DRAPES) ×3 IMPLANT
DRAPE U-SHAPE 47X51 STRL (DRAPES) ×9 IMPLANT
DRESSING AQUACEL AG SP 3.5X10 (GAUZE/BANDAGES/DRESSINGS) ×1 IMPLANT
DRSG AQUACEL AG SP 3.5X10 (GAUZE/BANDAGES/DRESSINGS) ×3
DURAPREP 26ML APPLICATOR (WOUND CARE) ×3 IMPLANT
ELECT REM PT RETURN 9FT ADLT (ELECTROSURGICAL) ×3
ELECTRODE REM PT RTRN 9FT ADLT (ELECTROSURGICAL) ×1 IMPLANT
GLOVE BIO SURGEON STRL SZ7.5 (GLOVE) ×3 IMPLANT
GLOVE BIOGEL PI IND STRL 7.0 (GLOVE) ×2 IMPLANT
GLOVE BIOGEL PI IND STRL 7.5 (GLOVE) ×4 IMPLANT
GLOVE BIOGEL PI IND STRL 8 (GLOVE) ×2 IMPLANT
GLOVE BIOGEL PI INDICATOR 7.0 (GLOVE) ×4
GLOVE BIOGEL PI INDICATOR 7.5 (GLOVE) ×8
GLOVE BIOGEL PI INDICATOR 8 (GLOVE) ×4
GLOVE ECLIPSE 8.0 STRL XLNG CF (GLOVE) ×3 IMPLANT
GLOVE SURG SS PI 7.0 STRL IVOR (GLOVE) ×3 IMPLANT
GLOVE SURG SS PI 7.5 STRL IVOR (GLOVE) ×3 IMPLANT
GOWN STRL REUS W/ TWL XL LVL3 (GOWN DISPOSABLE) ×1 IMPLANT
GOWN STRL REUS W/TWL LRG LVL3 (GOWN DISPOSABLE) ×3 IMPLANT
GOWN STRL REUS W/TWL XL LVL3 (GOWN DISPOSABLE) ×8 IMPLANT
HANDPIECE INTERPULSE COAX TIP (DISPOSABLE) ×2
HOLDER FOLEY CATH W/STRAP (MISCELLANEOUS) ×3 IMPLANT
PACK ANTERIOR HIP CUSTOM (KITS) ×3 IMPLANT
RTRCTR WOUND ALEXIS 18CM MED (MISCELLANEOUS) ×3
SET HNDPC FAN SPRY TIP SCT (DISPOSABLE) ×1 IMPLANT
STRIP CLOSURE SKIN 1/2X4 (GAUZE/BANDAGES/DRESSINGS) ×2 IMPLANT
SUT ETHIBOND NAB CT1 #1 30IN (SUTURE) ×3 IMPLANT
SUT MNCRL AB 4-0 PS2 18 (SUTURE) ×3 IMPLANT
SUT VIC AB 0 CT1 36 (SUTURE) ×3 IMPLANT
SUT VIC AB 1 CT1 36 (SUTURE) ×3 IMPLANT
SUT VIC AB 2-0 CT1 27 (SUTURE) ×4
SUT VIC AB 2-0 CT1 TAPERPNT 27 (SUTURE) ×2 IMPLANT
TRAY FOLEY W/METER SILVER 14FR (SET/KITS/TRAYS/PACK) ×3 IMPLANT
YANKAUER SUCT BULB TIP NO VENT (SUCTIONS) ×3 IMPLANT

## 2016-07-17 NOTE — Anesthesia Postprocedure Evaluation (Signed)
Anesthesia Post Note  Patient: Cassandra Johnston  Procedure(s) Performed: Procedure(s) (LRB): LEFT TOTAL HIP ARTHROPLASTY ANTERIOR APPROACH (Left)  Patient location during evaluation: PACU Anesthesia Type: Spinal Level of consciousness: oriented and awake and alert Pain management: pain level controlled Vital Signs Assessment: post-procedure vital signs reviewed and stable Respiratory status: spontaneous breathing, respiratory function stable and patient connected to nasal cannula oxygen Cardiovascular status: blood pressure returned to baseline and stable Postop Assessment: no headache and no backache Anesthetic complications: no    Last Vitals:  Vitals:   07/17/16 1345 07/17/16 1355  BP: (!) 106/35   Pulse: 65 67  Resp: 14 10  Temp: 36.6 C     Last Pain:  Vitals:   07/17/16 1355  TempSrc:   PainSc: 5                  [name] Minda Meo

## 2016-07-17 NOTE — Progress Notes (Signed)
Radiology here to do portable x-ray left hip

## 2016-07-17 NOTE — Op Note (Signed)
NAME:  Cassandra Johnston, Cassandra Johnston               ACCOUNT NO.:  [id number]  MEDICAL RECORD NO.:  [id number]  LOCATION:  [facility]                         FACILITY:  [facility]  PHYSICIAN:  Lind Guest. Ninfa Linden, M.D.DATE OF BIRTH:  23-Nov-1951  DATE OF PROCEDURE:  07/17/2016 DATE OF DISCHARGE:                              OPERATIVE REPORT   PREOPERATIVE DIAGNOSIS:  Primary osteoarthritis and degenerative joint disease, left hip.  POSTOPERATIVE DIAGNOSIS:  Primary osteoarthritis and degenerative joint disease, left hip.  PROCEDURE:  Left total hip arthroplasty through direct anterior approach.  IMPLANTS:  DePuy Sector Gription acetabular component size 52, size 36+ 0 neutral polyethylene liner, size 11 Corail femoral component with varus offset (KLA), size 36+ 5 ceramic hip ball.  SURGEON:  Lind Guest. Ninfa Linden, M.D.  ASSISTANT:  Erskine Emery, PA-C.  ANESTHESIA:  Spinal.  ANTIBIOTICS:  2 g of IV Ancef.  BLOOD LOSS:  250 mL.  COMPLICATIONS:  None.  INDICATIONS:  Ms. Okelly is a 65 year old patient, well known to me.  She has known end-stage arthritis involving her left hip.  She has superolateral acetabular wear and femoral head wear.  There were periarticular osteophytes and joint space narrowing.  She has tried and failed all forms of conservative treatment and at this point, due to the detrimental affect her hip pain has had and her activities of daily living, her mobility and her quality of life.  She wished to proceed with a total hip arthroplasty.  She understands the risk of acute blood loss anemia, nerve and vessel injury, fracture, infection, dislocation, DVT.  She understands our goals are decreased pain, improved mobility and overall improved quality of life.  PROCEDURE DESCRIPTION:  After informed consent was obtained, appropriate left hip was marked.  She was brought to the operating room and spinal anesthesia was obtained while she was on her stretcher.  A Foley catheter was  placed and then traction boots were placed on both of her feet.  Next, she was placed supine on the Hana fracture table with the perineal post in place and both legs in inline skeletal traction devices, but no traction applied.  Her left operative hip was then prepped and draped with DuraPrep and sterile drapes.  A time-out was called and she was identified as correct patient and correct left hip. We then made an incision inferior and posterior to the anterior superior iliac spine and carried this obliquely down the leg.  We dissected down the tensor fascia lata muscle and tensor fascia was then divided longitudinally, so we could proceed with the direct anterior approach to the hip.  We identified and cauterized the circumflex vessels and then identified the hip capsule.  We opened up the hip capsule in L-type format finding a moderate joint effusion and significant periarticular osteophytes.  We placed Cobra retractors within the joint capsule and then made our femoral neck cut with an oscillating saw just proximal to the lesser trochanter and completed this with an osteotome.  We placed a corkscrew guide in the femoral head and removed the femoral head in its entirety and found a large area of devoid of cartilage.  We then cleaned the acetabulum and remnants of the acetabular labrum,  and placed a bent Hohmann over the medial acetabular rim and then began reaming in stepwise increments from a size 43 reamer up to a size 50 with the last reamer placed under direct fluoroscopy as well.  We then tried to place our 50 acetabular component from DePuy and I could not get it to seat and I felt we needed to better rim fit.  I then put a 52 reamer in place under direct fluoroscopic guidance and got a better bite with this.  So, we went up to a size 52 acetabular component.  With the size 52, was placed under direct visualization as well as direct fluoroscopy as well with the reamer, so we could  obtain our depth of reaming, our inclination and anteversion.  Again once we were pleased with this, we placed the real DePuy Sector Gription acetabular component without problems, it seated well under direct visualization and fluoroscopy.  We then placed a 36+ 0 neutral polyethylene liner for that size 52 acetabular component.  Attention was then turned to the femur.  With the leg externally rotated to 120 degrees, extended and adducted, we were able to place a Mueller retractor medially and a Hohmann retractor behind the greater trochanter.  We released the lateral joint capsule and used a box cutting osteotome to enter the femoral canal and a rongeur to lateralize.  We then began broaching from a size 8 broach using the Corail broaching system up to size 11.  With the size 11, tightly fit, we trialed a standard offset femoral neck and a 36+ 1.5 hip ball.  We brought the leg back over and up with traction and internal rotation reducing the pelvis and her leg lengths appeared equal as she was stable range of motion, but I felt like she needed a little bit more offset.  Decision was made to go with a varus offset femoral neck.  We removed the trial components and we placed the varus offset femoral stem size 11 from DePuy, which was a Corail femoral component.  Given that shortening of the leg length, we went to a 36+ 5 ceramic hip ball.  We reduced this in acetabulum and it was stable.  We were pleased with leg length, offset and range of motion.  We then irrigated the soft tissue with normal saline solution using pulsatile lavage.  We were able to close the joint capsule with interrupted #1 Ethibond suture followed by running #1 Vicryl in the tensor fascia, 0 Vicryl in the deep tissue, 2-0 Vicryl in the subcutaneous tissue, 4-0 Monocryl for subcuticular stitch and Steri-Strips on the skin.  An Aquacel dressing was applied.  She was taken off the Hana table and taken to the recovery room  in stable condition.  All final counts were correct.  There were no complications noted.  Of note, Erskine Emery, PA-C assistant the entire case.  His assistance was crucial for facilitating all aspects of this case.     Lind Guest. Ninfa Linden, M.D.     CYB/MEDQ  D:  07/17/2016  T:  07/17/2016  Job:  [id number]

## 2016-07-17 NOTE — Brief Op Note (Signed)
07/17/2016  12:49 PM  PATIENT:  Cassandra Johnston  65 y.o. female  PRE-OPERATIVE DIAGNOSIS:  severe osteoarthritis left hip  POST-OPERATIVE DIAGNOSIS:  severe osteoarthritis left hip  PROCEDURE:  Procedure(s): LEFT TOTAL HIP ARTHROPLASTY ANTERIOR APPROACH (Left)  SURGEON:  Surgeon(s) and Role:    * Mcarthur Rossetti, MD - Primary  PHYSICIAN ASSISTANT: Benita Stabile, PA-C  ANESTHESIA:   spinal  EBL:  Total I/O In: 1000 [I.V.:1000] Out: 250 [Blood:250]  COUNTS:  YES  DICTATION: .Other Dictation: Dictation Number [id number]  PLAN OF CARE: Admit to inpatient   PATIENT DISPOSITION:  PACU - hemodynamically stable.   Delay start of Pharmacological VTE agent (>24hrs) due to surgical blood loss or risk of bleeding: no

## 2016-07-17 NOTE — H&P (Signed)
TOTAL HIP ADMISSION H&P  Patient is admitted for left total hip arthroplasty.  Subjective:  Chief Complaint: left hip pain  HPI: Cassandra Johnston, 65 y.o. female, has a history of pain and functional disability in the left hip(s) due to arthritis and patient has failed non-surgical conservative treatments for greater than 12 weeks to include NSAID's and/or analgesics, corticosteriod injections, flexibility and strengthening excercises, supervised PT with diminished ADL's post treatment, use of assistive devices, weight reduction as appropriate and activity modification.  Onset of symptoms was abrupt starting 1 years ago with gradually worsening course since that time.The patient noted no past surgery on the left hip(s).  Patient currently rates pain in the left hip at 10 out of 10 with activity. Patient has night pain, worsening of pain with activity and weight bearing, pain that interfers with activities of daily living and pain with passive range of motion. Patient has evidence of subchondral sclerosis, periarticular osteophytes and joint space narrowing by imaging studies. This condition presents safety issues increasing the risk of falls.  There is no current active infection.  Patient Active Problem List   Diagnosis Date Noted  . Osteoarthritis of left hip 07/17/2016  . Chest pain 09/24/2015  . Shortness of breath 09/24/2015  . Palpitations 09/24/2015  . Breast cancer, left breast ([place]) 01/04/2014  . Malaise and fatigue 09/15/2013  . Cellulitis of breast 11/27/2011  . HYPERLIPIDEMIA 09/25/2011  . DM 04/24/2009  . ESOPHAGEAL STRICTURE 04/24/2009  . GERD 04/24/2009  . IRRITABLE BOWEL SYNDROME 04/24/2009  . DIARRHEA 04/24/2009  . COLONIC POLYPS, HX OF 04/24/2009   Past Medical History:  Diagnosis Date  . Anxiety   . Arthritis   . Breast cancer ([place]) 2010   left  . Breast infection    left breast  . Chest pain 09/24/2015  . Complication of anesthesia    patient states she becomes  combative with anesthesia with some surgeries  . Depression   . Diabetes mellitus   . Diverticulosis   . Esophageal stricture   . Family history of adverse reaction to anesthesia    father was also combative per patient report   . GERD (gastroesophageal reflux disease)   . Heart murmur   . Hypertension   . Palpitations 09/24/2015  . Pneumonia    hx of   . PONV (postoperative nausea and vomiting)   . Shortness of breath 09/24/2015   weight related per patient     Past Surgical History:  Procedure Laterality Date  . ABDOMINAL HYSTERECTOMY    . APPENDECTOMY    . BREAST LUMPECTOMY  2010  . CHOLECYSTECTOMY    . COLONOSCOPY    . CYSTECTOMY     on thyroid  . FRACTURE SURGERY     feet  . HAND SURGERY    . TONSILLECTOMY    . TUBAL LIGATION      No prescriptions prior to admission.   Allergies  Allergen Reactions  . Naproxen Other (See Comments)    GI  . Sulfa Antibiotics Rash    Social History  Substance Use Topics  . Smoking status: Former Smoker    Quit date: 12/01/2003  . Smokeless tobacco: Never Used  . Alcohol use No    Family History  Problem Relation Age of Onset  . Cancer Mother     colon  . Diabetes Mother   . Atrial fibrillation Father   . CAD Father      Review of Systems  Musculoskeletal: Positive for falls and  joint pain.  All other systems reviewed and are negative.   Objective:  Physical Exam  Constitutional: She is oriented to person, place, and time. She appears well-developed and well-nourished.  HENT:  Head: Normocephalic and atraumatic.  Eyes: EOM are normal. Pupils are equal, round, and reactive to light.  Neck: Normal range of motion. Neck supple.  Cardiovascular: Normal rate and regular rhythm.   Respiratory: Effort normal and breath sounds normal.  GI: Soft. Bowel sounds are normal.  Musculoskeletal:       Left hip: She exhibits decreased range of motion, decreased strength, tenderness and bony tenderness.  Neurological: She is  alert and oriented to person, place, and time.  Skin: Skin is warm and dry.  Psychiatric: She has a normal mood and affect.    Vital signs in last 24 hours:    Labs:   Estimated body mass index is 29.63 kg/m as calculated from the following:   Height as of 07/10/16: 5' 7.5" (1.715 m).   Weight as of 07/10/16: 87.1 kg (192 lb).   Imaging Review Plain radiographs demonstrate severe degenerative joint disease of the left hip(s). The bone quality appears to be good for age and reported activity level.  Assessment/Plan:  End stage arthritis, left hip(s)  The patient history, physical examination, clinical judgement of the provider and imaging studies are consistent with end stage degenerative joint disease of the left hip(s) and total hip arthroplasty is deemed medically necessary. The treatment options including medical management, injection therapy, arthroscopy and arthroplasty were discussed at length. The risks and benefits of total hip arthroplasty were presented and reviewed. The risks due to aseptic loosening, infection, stiffness, dislocation/subluxation,  thromboembolic complications and other imponderables were discussed.  The patient acknowledged the explanation, agreed to proceed with the plan and consent was signed. Patient is being admitted for inpatient treatment for surgery, pain control, PT, OT, prophylactic antibiotics, VTE prophylaxis, progressive ambulation and ADL's and discharge planning.The patient is planning to be discharged home with home health services

## 2016-07-17 NOTE — Transfer of Care (Signed)
Immediate Anesthesia Transfer of Care Note  Patient: Cassandra Johnston  Procedure(s) Performed: Procedure(s): LEFT TOTAL HIP ARTHROPLASTY ANTERIOR APPROACH (Left)  Patient Location: PACU  Anesthesia Type:Spinal  Level of Consciousness: awake, alert  and oriented  Airway & Oxygen Therapy: Patient Spontanous Breathing and Patient connected to face mask oxygen  Post-op Assessment: Report given to RN and Post -op Vital signs reviewed and stable  Post vital signs: Reviewed and stable  Last Vitals:  Vitals:   07/17/16 0927  BP: (!) 146/78  Pulse: 81  Resp: 18  Temp: 36.6 C    Last Pain:  Vitals:   07/17/16 0936  TempSrc:   PainSc: 4       Patients Stated Pain Goal: 4 ([id number])  Complications: No apparent anesthesia complications

## 2016-07-17 NOTE — Progress Notes (Signed)
Results of Hip X-Ray noted

## 2016-07-17 NOTE — Anesthesia Procedure Notes (Signed)
Spinal  Patient location during procedure: OR Start time: 07/17/2016 11:30 AM End time: 07/17/2016 11:35 AM Staffing Anesthesiologist: Reginal Lutes Performed: anesthesiologist  Preanesthetic Checklist Completed: patient identified, site marked, surgical consent, pre-op evaluation, timeout performed, IV checked, risks and benefits discussed and monitors and equipment checked Spinal Block Patient position: sitting Prep: Betadine Patient monitoring: heart rate, cardiac monitor, continuous pulse ox and blood pressure Approach: midline Location: L4-5 Injection technique: single-shot Needle Needle type: Sprotte  Needle gauge: 22 G

## 2016-07-18 LAB — BASIC METABOLIC PANEL
ANION GAP: 8 (ref 5–15)
BUN: 12 mg/dL (ref 6–20)
CO2: 25 mmol/L (ref 22–32)
Calcium: 8.6 mg/dL — ABNORMAL LOW (ref 8.9–10.3)
Chloride: 102 mmol/L (ref 101–111)
Creatinine, Ser: 0.7 mg/dL (ref 0.44–1.00)
GFR calc Af Amer: >60 mL/min (ref >60)
Glucose, Bld: 215 mg/dL — ABNORMAL HIGH (ref 65–99)
POTASSIUM: 4.5 mmol/L (ref 3.5–5.1)
SODIUM: 135 mmol/L (ref 135–145)

## 2016-07-18 LAB — CBC
HCT: 36.6 % (ref 36.0–46.0)
Hemoglobin: 11.9 g/dL — ABNORMAL LOW (ref 12.0–15.0)
MCH: 29.1 pg (ref 26.0–34.0)
MCHC: 32.5 g/dL (ref 30.0–36.0)
MCV: 89.5 fL (ref 78.0–100.0)
PLATELETS: 296 10*3/uL (ref 150–400)
RBC: 4.09 MIL/uL (ref 3.87–5.11)
RDW: 13.4 % (ref 11.5–15.5)
WBC: 19.4 10*3/uL — AB (ref 4.0–10.5)

## 2016-07-18 LAB — GLUCOSE, CAPILLARY
GLUCOSE-CAPILLARY: 207 mg/dL — AB (ref 65–99)
GLUCOSE-CAPILLARY: 187 mg/dL — AB (ref 65–99)
GLUCOSE-CAPILLARY: 98 mg/dL (ref 65–99)
Glucose-Capillary: 200 mg/dL — ABNORMAL HIGH (ref 65–99)

## 2016-07-18 MED ORDER — ASPIRIN 325 MG PO TBEC: 325.0000 mg | DELAYED_RELEASE_TABLET | Freq: Two times a day (BID) | ORAL | 0 refills | Status: DC

## 2016-07-18 MED ORDER — OXYCODONE-ACETAMINOPHEN 5-325 MG PO TABS: 1.0000 | ORAL_TABLET | ORAL | 0 refills | Status: DC | PRN

## 2016-07-18 MED ORDER — METHOCARBAMOL 500 MG PO TABS: 500.0000 mg | ORAL_TABLET | Freq: Four times a day (QID) | ORAL | 0 refills | Status: DC | PRN

## 2016-07-18 NOTE — Discharge Instructions (Signed)

## 2016-07-18 NOTE — Progress Notes (Signed)
Physical Therapy Treatment Patient Details Name: Cassandra Johnston MRN: [id number] DOB: 1951/04/12 Today's Date: 08/05/2016    History of Present Illness Pt s/p L THR    PT Comments    Pt motivated and progressing steadily with mobility.    Follow Up Recommendations  Home health PT     Equipment Recommendations  None recommended by PT    Recommendations for Other Services OT consult     Precautions / Restrictions Precautions Precautions: Fall Restrictions Weight Bearing Restrictions: No Other Position/Activity Restrictions: WBAT    Mobility  Bed Mobility Overal bed mobility: Needs Assistance Bed Mobility: Supine to Sit;Sit to Supine     Supine to sit: Min assist Sit to supine: Min assist   General bed mobility comments: cues for sequence and use of R LE to assist.  Transfers Overall transfer level: Needs assistance Equipment used: Rolling walker (2 wheeled) Transfers: Sit to/from Stand Sit to Stand: Min assist         General transfer comment: cues for hand placement and LE management.  Ambulation/Gait Ambulation/Gait assistance: Min assist Ambulation Distance (Feet): 58 Feet Assistive device: Rolling walker (2 wheeled) Gait Pattern/deviations: Step-to pattern;Decreased step length - right;Decreased step length - left;Shuffle;Trunk flexed Gait velocity: decr Gait velocity interpretation: Below normal speed for age/gender General Gait Details: cues for sequence, posture and position from [employer]            Wheelchair Mobility    Modified Rankin (Stroke Patients Only)       Balance                                    Cognition Arousal/Alertness: Awake/alert Behavior During Therapy: WFL for tasks assessed/performed Overall Cognitive Status: Within Functional Limits for tasks assessed                      Exercises      General Comments        Pertinent Vitals/Pain Pain Assessment: 0-10 Pain Score: 4   Pain Location: L hip Pain Descriptors / Indicators: Aching;Sore Pain Intervention(s): Limited activity within patient's tolerance;Monitored during session;Premedicated before session;Ice applied    Home Living                      Prior Function            PT Goals (current goals can now be found in the care plan section) Acute Rehab PT Goals Patient Stated Goal: return to independence PT Goal Formulation: With patient Time For Goal Achievement: 07/21/16 Potential to Achieve Goals: Good Progress towards PT goals: Progressing toward goals    Frequency  7X/week    PT Plan Current plan remains appropriate    Co-evaluation             End of Session Equipment Utilized During Treatment: Gait belt Activity Tolerance: Patient tolerated treatment well Patient left: in bed;with call bell/phone within reach;with family/visitor present     Time: 1535-1600 PT Time Calculation (min) (ACUTE ONLY): 25 min  Charges:  $Gait Training: 23-37 mins                    G Codes:      Ben Sanz 08/05/16, 5:17 PM

## 2016-07-18 NOTE — Progress Notes (Signed)
Subjective: 1 Day Post-Op Procedure(s) (LRB): LEFT TOTAL HIP ARTHROPLASTY ANTERIOR APPROACH (Left) Patient reports pain as moderate.    Objective: Vital signs in last 24 hours: Temp:  [97.8 F (36.6 C)-99.1 F (37.3 C)] 98.4 F (36.9 C) (08/19 0542) Pulse Rate:  [62-81] 72 (08/19 0542) Resp:  [9-18] 16 (08/19 0542) BP: (87-146)/(35-95) 131/68 (08/19 0542) SpO2:  [96 %-100 %] 98 % (08/19 0542) Weight:  [87.1 kg (192 lb)] 87.1 kg (192 lb) (08/18 1545)  Intake/Output from previous day: 08/18 0701 - 08/19 0700 In: 3508.8 [P.O.:540; I.V.:2913.8; IV Piggyback:55] Out: 1475 [Urine:1225; Blood:250] Intake/Output this shift: No intake/output data recorded.   Recent Labs  07/18/16 0411  HGB 11.9*    Recent Labs  07/18/16 0411  WBC 19.4*  RBC 4.09  HCT 36.6  PLT 296    Recent Labs  07/18/16 0411  NA 135  K 4.5  CL 102  CO2 25  BUN 12  CREATININE 0.70  GLUCOSE 215*  CALCIUM 8.6*   No results for input(s): LABPT, INR in the last 72 hours.  Sensation intact distally Intact pulses distally Dorsiflexion/Plantar flexion intact Incision: dressing C/D/I  Assessment/Plan: 1 Day Post-Op Procedure(s) (LRB): LEFT TOTAL HIP ARTHROPLASTY ANTERIOR APPROACH (Left) Up with therapy Plan for discharge tomorrow Discharge home with home health  Mcarthur Rossetti 07/18/2016, 8:05 AM

## 2016-07-18 NOTE — Evaluation (Signed)
Occupational Therapy Evaluation Patient Details Name: Cassandra Johnston MRN: [id number] DOB: Aug 31, 1951 Today's Date: 07/18/2016    History of Present Illness Pt s/p L THR   Clinical Impression   Pt a little sleepy during session but able to wake up fairly well to participate in session. Needed overall min assist with functional toilet transfer using walker today. Will follow on acute to progress ADL independence for return home with family.     Follow Up Recommendations  No OT follow up;Supervision/Assistance - 24 hour    Equipment Recommendations  3 in 1 bedside comode    Recommendations for Other Services       Precautions / Restrictions Precautions Precautions: Fall Restrictions Weight Bearing Restrictions: No Other Position/Activity Restrictions: WBAT      Mobility Bed Mobility Overal bed mobility: Needs Assistance Bed Mobility: Supine to Sit;Sit to Supine     Supine to sit: Min assist Sit to supine: Min assist   General bed mobility comments: cues for sequence and use of R LE to assist.  Transfers Overall transfer level: Needs assistance Equipment used: Rolling walker (2 wheeled) Transfers: Sit to/from Stand Sit to Stand: Min assist         General transfer comment: cues for hand placement and LE management.    Balance                                            ADL Overall ADL's : Needs assistance/impaired Eating/Feeding: Independent;Sitting   Grooming: Wash/dry hands;Set up;Sitting   Upper Body Bathing: Set up;Sitting   Lower Body Bathing: Minimal assistance;Sit to/from stand   Upper Body Dressing : Set up;Sitting   Lower Body Dressing: Moderate assistance;Sit to/from stand   Toilet Transfer: Minimal assistance;Ambulation;BSC   Toileting- Clothing Manipulation and Hygiene: Minimal assistance;Sit to/from stand         General ADL Comments: Pt alittle sleepy upon arrival and woke up fairly well for session but still  alittle sleepy during session. Discussed tub options including a tubbench versus the chair that she owns. Also discussed sponge bathing initially if she is not ready for stepping over the tub yet. Pt needed min /mod cues for safety with walker especially in tight spaces.      Vision     Perception     Praxis      Pertinent Vitals/Pain Pain Assessment: 0-10 Pain Score: 5  Pain Location: L hip Pain Descriptors / Indicators: Aching Pain Intervention(s): Monitored during session;Ice applied     Hand Dominance     Extremity/Trunk Assessment Upper Extremity Assessment Upper Extremity Assessment: Overall WFL for tasks assessed           Communication Communication Communication: No difficulties   Cognition Arousal/Alertness: Awake/alert Behavior During Therapy: WFL for tasks assessed/performed Overall Cognitive Status: Within Functional Limits for tasks assessed                     General Comments       Exercises       Shoulder Instructions      Home Living Family/patient expects to be discharged to:: Private residence Living Arrangements: Spouse/significant other Available Help at Discharge: Family Type of Home: House Home Access: Stairs to enter Technical brewer of Steps: 7 Entrance Stairs-Rails: Right;Left Home Layout: One level     Bathroom Shower/Tub: Teacher, early years/pre: Standard  Home Equipment: [place] - 2 wheels;Cane - single point;Shower seat          Prior Functioning/Environment Level of Independence: Independent             OT Diagnosis: Generalized weakness   OT Problem List: Decreased strength;Decreased knowledge of use of DME or AE   OT Treatment/Interventions: Self-care/ADL training;Patient/family education;Therapeutic activities    OT Goals(Current goals can be found in the care plan section) Acute Rehab OT Goals Patient Stated Goal: return to independence OT Goal Formulation: With patient Time  For Goal Achievement: 07/25/16 Potential to Achieve Goals: Good  OT Frequency: Min 2X/week   Barriers to D/C:            Co-evaluation              End of Session Equipment Utilized During Treatment: Rolling walker  Activity Tolerance: Patient tolerated treatment well Patient left: in bed;with call bell/phone within reach   Time: 1100-1130 OT Time Calculation (min): 30 min Charges:  OT General Charges $OT Visit: 1 Procedure OT Evaluation $OT Eval Low Complexity: 1 Procedure OT Treatments $Therapeutic Activity: 8-22 mins G-Codes:    Jules Schick 07/18/2016, 1:16 PM

## 2016-07-18 NOTE — Care Management Note (Signed)
Case Management Note  Patient Details  Name: Cassandra Johnston MRN: [id number] Date of Birth: [birth year]  Subjective/Objective:    S/p L THR                Action/Plan: Discharge Planning: NCM spoke to pt and states she has a older borrowed RW at home. Contacted [facility] for RW and 3n1 for home. Offered choice for [facility]. Pt agreeable to [facility] for [facility]. [facility] preoperative arranged.   PCP- Orpah Melter  MD   Expected Discharge Date:             Expected Discharge Plan:  [place]  In-House Referral:  NA  Discharge planning Services  CM Consult  Post Acute Care Choice:  Home Health Choice offered to:  Patient  DME Arranged:  3-N-1, Walker rolling DME Agency:  [place]:  PT [place]:  [facility] (now [facility])  Status of Service:  progress  If discussed at [place] of Stay Meetings, dates discussed:    Additional Comments:  Erenest Rasher, RN 07/18/2016, 3:44 PM

## 2016-07-18 NOTE — Evaluation (Signed)
Physical Therapy Evaluation Patient Details Name: Cassandra Johnston MRN: [id number] DOB: 06/21/1951 Today's Date: 07/18/2016   History of Present Illness  Pt s/p L THR  Clinical Impression  Pt s/p L THR presents with decreased L LE strength/ROM and post op pain limiting functional mobility.  Pt should progress to dc home with family assist and HHPT follow up.    Follow Up Recommendations Home health PT    Equipment Recommendations  None recommended by PT    Recommendations for Other Services OT consult     Precautions / Restrictions Precautions Precautions: Fall Restrictions Weight Bearing Restrictions: No Other Position/Activity Restrictions: WBAT      Mobility  Bed Mobility Overal bed mobility: Needs Assistance Bed Mobility: Supine to Sit     Supine to sit: Min assist     General bed mobility comments: cues for sequence and use of R LE to self assist  Transfers Overall transfer level: Needs assistance Equipment used: Rolling walker (2 wheeled) Transfers: Sit to/from Stand Sit to Stand: Min assist         General transfer comment: cues for LE management and use of UEs to self assist  Ambulation/Gait Ambulation/Gait assistance: Min assist Ambulation Distance (Feet): 29 Feet Assistive device: Rolling walker (2 wheeled) Gait Pattern/deviations: Step-to pattern Gait velocity: decr Gait velocity interpretation: Below normal speed for age/gender General Gait Details: cues for sequence, posture and position from RW  Stairs            Wheelchair Mobility    Modified Rankin (Stroke Patients Only)       Balance                                             Pertinent Vitals/Pain Pain Assessment: 0-10 Pain Score: 6  Pain Location: L hip Pain Descriptors / Indicators: Aching;Sore Pain Intervention(s): Limited activity within patient's tolerance;Monitored during session;Premedicated before session;Ice applied    Home Living  Family/patient expects to be discharged to:: Private residence Living Arrangements: Spouse/significant other Available Help at Discharge: Family Type of Home: House Home Access: Stairs to enter Entrance Stairs-Rails: Psychiatric nurse of Steps: 7 Home Layout: One level Home Equipment: Environmental consultant - 2 wheels;Cane - single point      Prior Function Level of Independence: Independent               Hand Dominance        Extremity/Trunk Assessment   Upper Extremity Assessment: Overall WFL for tasks assessed           Lower Extremity Assessment: LLE deficits/detail   LLE Deficits / Details: Strength at hip 2+/5 with AAROM at hip to 85 flex and 15 abd  Cervical / Trunk Assessment: Normal  Communication   Communication: No difficulties  Cognition Arousal/Alertness: Awake/alert Behavior During Therapy: WFL for tasks assessed/performed Overall Cognitive Status: Within Functional Limits for tasks assessed                      General Comments      Exercises Total Joint Exercises Ankle Circles/Pumps: AROM;Both;15 reps;Supine Quad Sets: AROM;Both;10 reps;Supine Heel Slides: AAROM;Left;20 reps;Supine Hip ABduction/ADduction: AAROM;Left;15 reps;Supine      Assessment/Plan    PT Assessment Patient needs continued PT services  PT Diagnosis Difficulty walking   PT Problem List Decreased strength;Decreased range of motion;Decreased activity tolerance;Decreased mobility;Decreased knowledge of use of  DME;Pain  PT Treatment Interventions DME instruction;Gait training;Stair training;Functional mobility training;Therapeutic activities;Therapeutic exercise;Patient/family education   PT Goals (Current goals can be found in the Care Plan section) Acute Rehab PT Goals Patient Stated Goal: Regain IND and walk without pain PT Goal Formulation: With patient Time For Goal Achievement: 07/21/16 Potential to Achieve Goals: Good    Frequency 7X/week   Barriers  to discharge        Co-evaluation               End of Session Equipment Utilized During Treatment: Gait belt Activity Tolerance: Patient tolerated treatment well Patient left: in chair;with chair alarm set;with call bell/phone within reach Nurse Communication: Mobility status         Time: [id number] PT Time Calculation (min) (ACUTE ONLY): 32 min   Charges:   PT Evaluation $PT Eval Low Complexity: 1 Procedure PT Treatments $Therapeutic Exercise: 8-22 mins   PT G Codes:        Mical Kicklighter 2016/08/03, 12:40 PM

## 2016-07-19 LAB — GLUCOSE, CAPILLARY: Glucose-Capillary: 158 mg/dL — ABNORMAL HIGH (ref 65–99)

## 2016-07-19 NOTE — Progress Notes (Signed)
Occupational Therapy Treatment Patient Details Name: Cassandra Johnston MRN: [id number] DOB: 10-19-1951 Today's Date: 07/19/2016    History of present illness Pt s/p L THR   OT comments  All education completed this am.  Pt initially had difficulty lifting foot and has burning pain when standing.  Pt will have assist for adls at home.  No further acute OT needs  Follow Up Recommendations  No OT follow up;Supervision/Assistance - 24 hour    Equipment Recommendations  3 in 1 bedside comode    Recommendations for Other Services      Precautions / Restrictions Precautions Precautions: Fall Restrictions Other Position/Activity Restrictions: WBAT       Mobility Bed Mobility         Supine to sit: Min assist Sit to supine: Min assist   General bed mobility comments: assist for LLE. Educated on crossing RLE under L or using sheet/belt if she wants to do this herself  Transfers   Equipment used: Conservation officer, nature (2 wheeled) Transfers: Sit to/from Stand Sit to Stand: [employer] transfer comment: for safety:  cues for LE placement    Balance                                   ADL                           Toilet Transfer: Min guard;Ambulation;BSC;RW   Toileting- Water quality scientist and Hygiene: Min guard;Sit to/from stand         General ADL Comments: pt stood at sink to brush teeth with supervision; wanted to change her PJs while in bathroom:  min A for pants.  She does not have a reacher, but has plenty of assistance at home. When ambulating to bathroom, pt initially needed cues to lift toes.  Educated on shower transfer (upstairs).  She will sponge bathe initially      Vision                     Perception     Praxis      Cognition   Behavior During Therapy: WFL for tasks assessed/performed Overall Cognitive Status: Within Functional Limits for tasks assessed                        Extremity/Trunk Assessment               Exercises     Shoulder Instructions       General Comments      Pertinent Vitals/ Pain       Pain Score: 4  Pain Location: L hip Pain Descriptors / Indicators: Burning Pain Intervention(s): Limited activity within patient's tolerance;Monitored during session;Premedicated before session;Repositioned;Ice applied  Home Living                                          Prior Functioning/Environment              Frequency       Progress Toward Goals  OT Goals(current goals can now be found in the care plan section)  Progress towards OT goals: Goals met/education completed, patient discharged from OT (pt verbalizes understanding of shower sequence)  Plan      Co-evaluation                 End of Session     Activity Tolerance Patient tolerated treatment well   Patient Left in bed;with call bell/phone within reach   Nurse Communication          Time: 9539-6728 OT Time Calculation (min): 38 min  Charges: OT General Charges $OT Visit: 1 Procedure OT Treatments $Self Care/Home Management : 23-37 mins  Ekam Bonebrake 07/19/2016, 9:14 AM  Lesle Chris, OTR/L [phone] 07/19/2016

## 2016-07-19 NOTE — Progress Notes (Signed)
Discharged from floor via w/c for transport home by car. Family & belongings with pt. No changes in assessment. Cassandra Johnston  

## 2016-07-19 NOTE — Care Management (Signed)
CM spoke with patient. Patient has received DME. Presenter, broadcasting BSN CCM

## 2016-07-19 NOTE — Progress Notes (Signed)
Physical Therapy Treatment Patient Details Name: Cassandra Johnston MRN: [id number] DOB: 18-May-1951 Today's Date: 07/19/2016    History of Present Illness Pt s/p L THR    PT Comments    Pt continues to require increased time for all tasks but progressing steadily with mobility.  Reviewed stairs and car transfers with pt and spouse.  Follow Up Recommendations  Home health PT     Equipment Recommendations  None recommended by PT    Recommendations for Other Services OT consult     Precautions / Restrictions Precautions Precautions: Fall Restrictions Weight Bearing Restrictions: No Other Position/Activity Restrictions: WBAT    Mobility  Bed Mobility         Supine to sit: Min assist Sit to supine: Min assist   General bed mobility comments: NT - pt to EOB unassisted on arrival to room  Transfers Overall transfer level: Needs assistance Equipment used: Rolling walker (2 wheeled) Transfers: Sit to/from Stand Sit to Stand: Supervision         General transfer comment: cues for LE management and use of UEs to self assist  Ambulation/Gait Ambulation/Gait assistance: Min guard;Supervision Ambulation Distance (Feet): 100 Feet (and 15' into bathroom) Assistive device: Rolling walker (2 wheeled) Gait Pattern/deviations: Step-to pattern;Decreased step length - right;Decreased step length - left;Shuffle;Trunk flexed Gait velocity: decr Gait velocity interpretation: Below normal speed for age/gender General Gait Details: cues for sequence, posture and position from RW   Stairs Stairs: Yes Stairs assistance: Min assist Stair Management: One rail Right;Step to pattern;Forwards;With cane Number of Stairs: 3 General stair comments: cues for sequence and foot/cane placement  Wheelchair Mobility    Modified Rankin (Stroke Patients Only)       Balance                                    Cognition Arousal/Alertness: Awake/alert Behavior During  Therapy: WFL for tasks assessed/performed Overall Cognitive Status: Within Functional Limits for tasks assessed                      Exercises      General Comments        Pertinent Vitals/Pain Pain Assessment: 0-10 Pain Score: 4  Pain Location: L hip Pain Descriptors / Indicators: Aching;Burning Pain Intervention(s): Limited activity within patient's tolerance;Monitored during session;Premedicated before session;Ice applied    Home Living                      Prior Function            PT Goals (current goals can now be found in the care plan section) Acute Rehab PT Goals Patient Stated Goal: return to independence PT Goal Formulation: With patient Time For Goal Achievement: 07/21/16 Potential to Achieve Goals: Good Progress towards PT goals: Progressing toward goals    Frequency  7X/week    PT Plan Current plan remains appropriate    Co-evaluation             End of Session Equipment Utilized During Treatment: Gait belt Activity Tolerance: Patient tolerated treatment well Patient left: in chair;with call bell/phone within reach;with family/visitor present     Time: 1050-1135 PT Time Calculation (min) (ACUTE ONLY): 45 min  Charges:  $Gait Training: 23-37 mins $Therapeutic Activity: 8-22 mins                    G  Codes:      Brenda Cowher 07/19/2016, 12:49 PM

## 2016-07-19 NOTE — Progress Notes (Signed)
Subjective: Patient stable pain controlled has ambulated well in the hall   Objective: Vital signs in last 24 hours: Temp:  [98.5 F (36.9 C)-100 F (37.8 C)] 100 F (37.8 C) (08/20 0549) Pulse Rate:  [89-98] 98 (08/20 0549) Resp:  [18] 18 (08/20 0549) BP: (91-140)/(48-68) 111/48 (08/20 0549) SpO2:  [92 %-97 %] 95 % (08/20 0549)  Intake/Output from previous day: 08/19 0701 - 08/20 0700 In: 720 [P.O.:720] Out: 625 [Urine:625] Intake/Output this shift: Total I/O In: 120 [P.O.:120] Out: -   Exam:  Sensation intact distally Intact pulses distally  Labs:  Recent Labs  07/18/16 0411  HGB 11.9*    Recent Labs  07/18/16 0411  WBC 19.4*  RBC 4.09  HCT 36.6  PLT 296    Recent Labs  07/18/16 0411  NA 135  K 4.5  CL 102  CO2 25  BUN 12  CREATININE 0.70  GLUCOSE 215*  CALCIUM 8.6*   No results for input(s): LABPT, INR in the last 72 hours.  Assessment/Plan: DC today Rx already on chart   Cassandra Johnston 07/19/2016, 8:50 AM

## 2016-07-31 NOTE — Discharge Summary (Signed)
Patient ID: Cassandra Johnston MRN: [id number] DOB/AGE: [birth year] 65 y.o.  Admit date: 07/17/2016 Discharge date: 07/31/2016  Admission Diagnoses:  Principal Problem:   Osteoarthritis of left hip Active Problems:   Status post left hip replacement   Discharge Diagnoses:  Same  Past Medical History:  Diagnosis Date  . Anxiety   . Arthritis   . Breast cancer ([place]) 2010   left  . Breast infection    left breast  . Chest pain 09/24/2015  . Complication of anesthesia    patient states she becomes combative with anesthesia with some surgeries  . Depression   . Diabetes mellitus   . Diverticulosis   . Esophageal stricture   . Family history of adverse reaction to anesthesia    father was also combative per patient report   . GERD (gastroesophageal reflux disease)   . Heart murmur   . Hypertension   . Palpitations 09/24/2015  . Pneumonia    hx of   . PONV (postoperative nausea and vomiting)   . Shortness of breath 09/24/2015   weight related per patient     Surgeries: Procedure(s): LEFT TOTAL HIP ARTHROPLASTY ANTERIOR APPROACH on 07/17/2016   Consultants: none  Discharged Condition: Improved  Hospital Course: KOLBIE ECHELBERGER is an 65 y.o. female who was admitted 07/17/2016 for operative treatment ofOsteoarthritis of left hip. Patient has severe unremitting pain that affects sleep, daily activities, and work/hobbies. After pre-op clearance the patient was taken to the operating room on 07/17/2016 and underwent  Procedure(s): LEFT TOTAL HIP ARTHROPLASTY ANTERIOR APPROACH.    Patient was given perioperative antibiotics:  Anti-infectives    Start     Dose/Rate Route Frequency Ordered Stop   07/17/16 1730  ceFAZolin (ANCEF) IVPB 1 g/50 mL premix     1 g 100 mL/hr over 30 Minutes Intravenous Every 6 hours 07/17/16 1614 07/17/16 2347   07/17/16 0913  ceFAZolin (ANCEF) IVPB 2g/100 mL premix     2 g 200 mL/hr over 30 Minutes Intravenous On call to O.R. 07/17/16 0913 07/17/16  1142       Patient was given sequential compression devices, early ambulation, and chemoprophylaxis to prevent DVT.  Patient benefited maximally from hospital stay and there were no complications.    Recent vital signs: No data found.    Recent laboratory studies: No results for input(s): WBC, HGB, HCT, PLT, NA, K, CL, CO2, BUN, CREATININE, GLUCOSE, INR, CALCIUM in the last 72 hours.  Invalid input(s): PT, 2   Discharge Medications:     Medication List    STOP taking these medications   acetaminophen-codeine 300-30 MG tablet Commonly known as:  TYLENOL #3     TAKE these medications   aspirin 325 MG EC tablet Take 1 tablet (325 mg total) by mouth 2 (two) times daily after a meal.   benazepril 10 MG tablet Commonly known as:  LOTENSIN Take 10 mg by mouth daily.   insulin NPH-regular Human (70-30) 100 UNIT/ML injection Commonly known as:  NOVOLIN 70/30 Inject 30 Units into the skin daily. Patient takes in evening before supper   metFORMIN 500 MG tablet Commonly known as:  GLUCOPHAGE Take 1,000 mg by mouth 2 (two) times daily.   methocarbamol 500 MG tablet Commonly known as:  ROBAXIN Take 1 tablet (500 mg total) by mouth every 6 (six) hours as needed for muscle spasms.   metoprolol succinate 25 MG 24 hr tablet Commonly known as:  TOPROL-XL Take 1 tablet (25 mg total) by mouth daily.  Take with or immediately following a meal.   ondansetron 4 MG tablet Commonly known as:  ZOFRAN Take 4 mg by mouth 2 (two) times daily.   oxyCODONE-acetaminophen 5-325 MG tablet Commonly known as:  ROXICET Take 1-2 tablets by mouth every 4 (four) hours as needed.   PEPCID PO Take 50 mg by mouth 2 (two) times daily.   pravastatin 10 MG tablet Commonly known as:  PRAVACHOL Take 1 tablet by mouth daily.   tiZANidine 4 MG tablet Commonly known as:  ZANAFLEX Take 4 mg by mouth at bedtime.   TOUJEO SOLOSTAR 300 UNIT/ML Sopn Generic drug:  Insulin Glargine Inject 40 Units into  the skin daily.   TRULICITY 1.5 0000000 Sopn Generic drug:  Dulaglutide Inject 1.5 mg into the skin once a week. Tuesdays   venlafaxine XR 75 MG 24 hr capsule Commonly known as:  EFFEXOR-XR Take 75 mg by mouth daily.       Diagnostic Studies: Dg C-arm 1-60 Min-no Report  Result Date: 07/17/2016 CLINICAL DATA:  Left hip replacement. EXAM: OPERATIVE LEFT HIP (WITH PELVIS IF PERFORMED) 2 VIEWS TECHNIQUE: Fluoroscopic spot image(s) were submitted for interpretation post-operatively. COMPARISON:  03/14/2016 FINDINGS: Changes of left hip replacement. No hardware or bony complicating feature. Normal AP alignment. IMPRESSION: Left hip replacement.  No complicating feature. Electronically Signed   By: Rolm Baptise M.D.   On: 07/17/2016 13:11   Dg Hip Port Unilat With Pelvis 1v Left  Result Date: 07/17/2016 CLINICAL DATA:  Status post left hip joint replacement EXAM: DG HIP (WITH OR WITHOUT PELVIS) 1V PORT LEFT COMPARISON:  Intraoperative fluoro spot images of today's date FINDINGS: The bones are subjectively osteopenic. Radiographic positioning of the prosthetic left hip is good. The interface with the native bone appears normal. No native bone fracture or other acute abnormality is observed. IMPRESSION: No immediate postprocedure complication following left hip joint replacement. Electronically Signed   By: David  Martinique M.D.   On: 07/17/2016 13:56   Dg Hip Operative Unilat With Pelvis Left  Result Date: 07/17/2016 CLINICAL DATA:  Left hip replacement. EXAM: OPERATIVE LEFT HIP (WITH PELVIS IF PERFORMED) 2 VIEWS TECHNIQUE: Fluoroscopic spot image(s) were submitted for interpretation post-operatively. COMPARISON:  03/14/2016 FINDINGS: Changes of left hip replacement. No hardware or bony complicating feature. Normal AP alignment. IMPRESSION: Left hip replacement.  No complicating feature. Electronically Signed   By: Rolm Baptise M.D.   On: 07/17/2016 13:11    Disposition: 01-Home or Self  Care  Discharge Instructions    Call MD / Call 911    Complete by:  As directed   If you experience chest pain or shortness of breath, CALL 911 and be transported to the hospital emergency room.  If you develope a fever above 101 F, pus (white drainage) or increased drainage or redness at the wound, or calf pain, call your surgeon's office.   Constipation Prevention    Complete by:  As directed   Drink plenty of fluids.  Prune juice may be helpful.  You may use a stool softener, such as Colace (over the counter) 100 mg twice a day.  Use MiraLax (over the counter) for constipation as needed.   Diet - low sodium heart healthy    Complete by:  As directed   Increase activity slowly as tolerated    Complete by:  As directed      Follow-up Information    Mcarthur Rossetti, MD Follow up in 2 week(s).   Specialty:  Orthopedic Surgery  Contact information: [place] [address] [phone]        [facility] .   Why:  Home Health Physical Therapy Contact information: [address] [place] [address] [phone]            Signed: Erskine Emery 07/31/2016, 4:00 PM

## 2016-09-24 ENCOUNTER — Ambulatory Visit (INDEPENDENT_AMBULATORY_CARE_PROVIDER_SITE_OTHER): Payer: Medicare Other | Admitting: Physician Assistant

## 2016-12-18 ENCOUNTER — Other Ambulatory Visit: Payer: Self-pay | Admitting: Family Medicine

## 2016-12-18 DIAGNOSIS — Z853 Personal history of malignant neoplasm of breast: Secondary | ICD-10-CM

## 2016-12-23 ENCOUNTER — Ambulatory Visit
Admission: RE | Admit: 2016-12-23 | Discharge: 2016-12-23 | Disposition: A | Discharge status: Home / Self Care | Payer: Medicare Other | Source: Ambulatory Visit | Attending: Family Medicine | Admitting: Family Medicine

## 2016-12-23 DIAGNOSIS — Z853 Personal history of malignant neoplasm of breast: Secondary | ICD-10-CM

## 2017-01-11 ENCOUNTER — Ambulatory Visit (INDEPENDENT_AMBULATORY_CARE_PROVIDER_SITE_OTHER): Payer: Medicare Other | Admitting: Physician Assistant

## 2017-01-12 ENCOUNTER — Encounter: Payer: Self-pay | Admitting: Neurology

## 2017-01-13 ENCOUNTER — Ambulatory Visit: Payer: Medicare Other | Admitting: Neurology

## 2017-02-03 ENCOUNTER — Ambulatory Visit (INDEPENDENT_AMBULATORY_CARE_PROVIDER_SITE_OTHER): Payer: Medicare Other | Admitting: Neurology

## 2017-02-03 ENCOUNTER — Encounter: Payer: Self-pay | Admitting: Neurology

## 2017-02-03 DIAGNOSIS — G250 Essential tremor: Secondary | ICD-10-CM

## 2017-02-03 DIAGNOSIS — G63 Polyneuropathy in diseases classified elsewhere: Secondary | ICD-10-CM | POA: Diagnosis not present

## 2017-02-03 DIAGNOSIS — G629 Polyneuropathy, unspecified: Secondary | ICD-10-CM | POA: Insufficient documentation

## 2017-02-03 NOTE — Progress Notes (Signed)
PATIENT: Cassandra Johnston DOB: July 22, 1951  Chief Complaint  Patient presents with  . Tremors    She is here to have the tremors in her head and arms further evaluated.  Marland Kitchen PCP    Orpah Melter, MD (last saw Judge Stall, NP - same practice)     HISTORICAL  Cassandra Johnston is a right-handed 66 years old female, seen in refer by her primary care physician Dr. Jackolyn Confer for evaluation of hand and head tremor, initial evaluation was on February 03 2017.  I reviewed and summarized referring note, she had a history of type 2 diabetes since 2008, insulin-dependent, left trigeminal neuralgia, last flare up was in 2017, she takes gabapentin occasionally, left breast cancer in 2010, status post left lobectomy followed by radiation therapy, hypertension, left hip replacement in August 2017.  Her father suffered tremor in his 35, died at age 26 from pancreatic cancer  She noticed tremor since 2016, it does not affecting her life style, in nervous situation, she felt occasionally bilateral hands tremor, difficulty writing, difficulty putting on makeup, but otherwise she did not notice any significant limitations.  She has complaints of decreased sense of smell, chronic diarrhea, occasionally vivid dreams, REM sleep disorder, she denies significant orthostatic symptoms.  But since her hip replacement in August 2017, she noticed occasionally loss of balance, especially when step down curbs, in a rush.  I reviewed laboratory evaluation elevated A1c 8.8, elevated WBC 15, 8.9 percent of neutrophil   REVIEW OF SYSTEMS: Full 14 system review of systems performed and notable only for palpitation, and rash, moles, diarrhea, allergy, depression, numbness, insomnia   ALLERGIES: Allergies  Allergen Reactions  . Naproxen Other (See Comments)    GI  . Sulfa Antibiotics Rash    HOME MEDICATIONS: Current Outpatient Prescriptions  Medication Sig Dispense Refill  . benazepril (LOTENSIN) 10 MG  tablet Take 10 mg by mouth daily.    . DULoxetine (CYMBALTA) 30 MG capsule Take 30 mg by mouth daily.    . Famotidine (PEPCID PO) Take 50 mg by mouth 2 (two) times daily.      . Insulin Glargine (TOUJEO SOLOSTAR) 300 UNIT/ML SOPN Inject 40 Units into the skin daily.    . insulin NPH-regular Human (NOVOLIN 70/30) (70-30) 100 UNIT/ML injection Inject 30 Units into the skin daily. Patient takes in evening before supper    . metFORMIN (GLUCOPHAGE) 500 MG tablet Take 1,000 mg by mouth 2 (two) times daily.    . metoprolol succinate (TOPROL-XL) 25 MG 24 hr tablet Take 1 tablet (25 mg total) by mouth daily. Take with or immediately following a meal. 90 tablet 1  . Naltrexone-Bupropion HCl ER (CONTRAVE) 8-90 MG TB12 Take by mouth daily.    . pravastatin (PRAVACHOL) 10 MG tablet Take 1 tablet by mouth daily.    Marland Kitchen tiZANidine (ZANAFLEX) 4 MG tablet Take 4 mg by mouth at bedtime.     . TRULICITY 1.5 VP/7.1GG SOPN Inject 1.5 mg into the skin once a week. Tuesdays    . venlafaxine XR (EFFEXOR-XR) 75 MG 24 hr capsule Take 75 mg by mouth daily.     No current facility-administered medications for this visit.     PAST MEDICAL HISTORY: Past Medical History:  Diagnosis Date  . Anxiety   . Arthritis   . Breast cancer ([place]) 2010   left  . Breast infection    left breast  . Chest pain 09/24/2015  . Complication of anesthesia  patient states she becomes combative with anesthesia with some surgeries  . Depression   . Diabetes mellitus   . Diverticulosis   . Esophageal stricture   . Family history of adverse reaction to anesthesia    father was also combative per patient report   . GERD (gastroesophageal reflux disease)   . Heart murmur   . Hypertension   . Palpitations 09/24/2015  . Pneumonia    hx of   . PONV (postoperative nausea and vomiting)   . Shortness of breath 09/24/2015   weight related per patient   . Tremor     PAST SURGICAL HISTORY: Past Surgical History:  Procedure Laterality  Date  . ABDOMINAL HYSTERECTOMY    . APPENDECTOMY    . BREAST LUMPECTOMY  2010  . CHOLECYSTECTOMY    . COLONOSCOPY    . CYSTECTOMY     on thyroid  . FRACTURE SURGERY     feet  . HAND SURGERY    . TONSILLECTOMY    . TOTAL HIP ARTHROPLASTY Left 07/17/2016   Procedure: LEFT TOTAL HIP ARTHROPLASTY ANTERIOR APPROACH;  Surgeon: Mcarthur Rossetti, MD;  Location: [facility];  Service: Orthopedics;  Laterality: Left;  . TUBAL LIGATION      FAMILY HISTORY: Family History  Problem Relation Age of Onset  . Diabetes Mother   . Colon cancer Mother   . Atrial fibrillation Father   . CAD Father   . Pancreatic cancer Father     SOCIAL HISTORY:  Social History   Social History  . Marital status: Married    Spouse name: N/A  . Number of children: 2  . Years of education: Associates   Occupational History  . Retired    Social History Main Topics  . Smoking status: Former Smoker    Quit date: 12/01/2003  . Smokeless tobacco: Never Used     Comment: Quit 10+ years ago  . Alcohol use No     Comment: Quit 25+ years ago.  . Drug use: No  . Sexual activity: Not on file   Other Topics Concern  . Not on file   Social History Narrative   Epworth Sleepiness Scale = 9 (as of 09/24/2015)         Lives at home with husband and granddaughter.   Right-handed.   1 cup coffee per day.     PHYSICAL EXAM   Vitals:   02/03/17 0901  BP: (!) 148/79  Pulse: 69  Weight: 191 lb 12 oz (87 kg)  Height: 5' 7.5" (1.715 m)    Not recorded      Body mass index is 29.59 kg/m.  PHYSICAL EXAMNIATION:  Gen: NAD, conversant, well nourised, obese, well groomed                     Cardiovascular: Regular rate rhythm, no peripheral edema, warm, nontender. Eyes: Conjunctivae clear without exudates or hemorrhage Neck: Supple, no carotid bruits. Pulmonary: Clear to auscultation bilaterally   NEUROLOGICAL EXAM:  MENTAL STATUS: Speech:    Speech is normal; fluent and spontaneous with normal  comprehension.  Cognition:     Orientation to time, place and person     Normal recent and remote memory     Normal Attention span and concentration     Normal Language, naming, repeating,spontaneous speech     Fund of knowledge   CRANIAL NERVES: CN II: Visual fields are full to confrontation. Fundoscopic exam is normal with sharp discs and no vascular  changes. Pupils are round equal and briskly reactive to light. CN III, IV, VI: extraocular movement are normal. No ptosis. CN V: Facial sensation is intact to pinprick in all 3 divisions bilaterally. Corneal responses are intact.  CN VII: Face is symmetric with normal eye closure and smile. CN VIII: Hearing is normal to rubbing fingers CN IX, X: Palate elevates symmetrically. Phonation is normal. CN XI: Head turning and shoulder shrug are intact CN XII: Tongue is midline with normal movements and no atrophy.  MOTOR: She has mild bilateral hands postural tremor, no rigidity, she has slight left knee extension weakness,  REFLEXES: Reflexes are 2+ and symmetric at the biceps, triceps,  decreased at the left knee, and preserved at bilateral ankles. Plantar responses are flexor.  SENSORY:Length dependent decreased  to light touch, pinprick,to ankle level, preserved toe vibratory sensation and proprioception, she has a nonhealing wound at right first toe   COORDINATION: Rapid alternating movements and fine finger movements are intact. There is no dysmetria on finger-to-nose and heel-knee-shin.    GAIT/STANCE: Posture is normal. Gait is steady with normal steps, base, arm swing, and turning. Heel and toe walking are normal. Tandem gait is normal.  Romberg is absent.   DIAGNOSTIC DATA (LABS, IMAGING, TESTING) - I reviewed patient records, labs, notes, testing and imaging myself where available.   ASSESSMENT AND PLAN  CYRIL RAILEY is a 66 y.o. 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 Ph.D.  [facility] [address], [place], [place] [address] Ph: [phone] Fax: [phone]  CC: Orpah Melter, MD

## 2017-02-08 LAB — C-REACTIVE PROTEIN: CRP: 8.7 mg/L — AB (ref 0.0–4.9)

## 2017-02-08 LAB — SEDIMENTATION RATE: Sed Rate: 20 mm/hr (ref 0–40)

## 2017-02-08 LAB — ANA W/REFLEX: Anti Nuclear Antibody(ANA): NEGATIVE

## 2017-02-08 LAB — MULTIPLE MYELOMA PANEL, SERUM
ALBUMIN SERPL ELPH-MCNC: 3.9 g/dL (ref 2.9–4.4)
Albumin/Glob SerPl: 1.2 (ref 0.7–1.7)
Alpha 1: 0.3 g/dL (ref 0.0–0.4)
Alpha2 Glob SerPl Elph-Mcnc: 0.8 g/dL (ref 0.4–1.0)
B-Globulin SerPl Elph-Mcnc: 1.5 g/dL — ABNORMAL HIGH (ref 0.7–1.3)
Gamma Glob SerPl Elph-Mcnc: 0.9 g/dL (ref 0.4–1.8)
Globulin, Total: 3.5 g/dL (ref 2.2–3.9)
IGA/IMMUNOGLOBULIN A, SERUM: 408 mg/dL — AB (ref 87–352)
IgG (Immunoglobin G), Serum: 997 mg/dL (ref 700–1600)
IgM (Immunoglobulin M), Srm: 115 mg/dL (ref 26–217)
TOTAL PROTEIN: 7.4 g/dL (ref 6.0–8.5)

## 2017-02-08 LAB — THYROID PANEL WITH TSH
Free Thyroxine Index: 1.4 (ref 1.2–4.9)
T3 Uptake Ratio: 22 % — ABNORMAL LOW (ref 24–39)
T4, Total: 6.4 ug/dL (ref 4.5–12.0)
TSH: 2.04 u[IU]/mL (ref 0.450–4.500)

## 2017-02-08 LAB — CK: CK TOTAL: 39 U/L (ref 24–173)

## 2017-02-08 LAB — VITAMIN B12: Vitamin B-12: 384 pg/mL (ref 232–1245)

## 2017-03-15 ENCOUNTER — Other Ambulatory Visit: Payer: Self-pay | Admitting: Family Medicine

## 2017-05-12 ENCOUNTER — Encounter (INDEPENDENT_AMBULATORY_CARE_PROVIDER_SITE_OTHER): Payer: Self-pay | Admitting: Physician Assistant

## 2017-05-12 ENCOUNTER — Ambulatory Visit (INDEPENDENT_AMBULATORY_CARE_PROVIDER_SITE_OTHER): Payer: Medicare Other | Admitting: Physician Assistant

## 2017-05-12 VITALS — Ht 67.5 in | Wt 191.0 lb

## 2017-05-12 DIAGNOSIS — M7062 Trochanteric bursitis, left hip: Secondary | ICD-10-CM | POA: Diagnosis not present

## 2017-05-12 DIAGNOSIS — L600 Ingrowing nail: Secondary | ICD-10-CM

## 2017-05-12 MED ORDER — LIDOCAINE HCL 1 % IJ SOLN
3.0000 mL | INTRAMUSCULAR | Status: AC | PRN
  Administered 2017-05-12: 3 mL

## 2017-05-12 MED ORDER — METHYLPREDNISOLONE ACETATE 40 MG/ML IJ SUSP
40.0000 mg | INTRAMUSCULAR | Status: AC | PRN
  Administered 2017-05-12: 40 mg via INTRA_ARTICULAR

## 2017-05-12 NOTE — Progress Notes (Signed)
Office Visit Note   Patient: Cassandra Johnston           Date of Birth: February 08, 1951           MRN: [id number] Visit Date: 05/12/2017              Requested by: Orpah Melter, MD [address] [place], [place] [address] PCP: Orpah Melter, MD   Assessment & Plan: Visit Diagnoses:  1. Ingrown right greater toenail   2. Trochanteric bursitis, left hip     Plan: Plan will have her do IT band stretching for the left trochanteric bursitis. In regards to the right great toe nail we will schedule her for an 8 incision and permanent ablation of the right great toenail. She understands that with her history of MRSA there is a greater risk of infection. There is also risk of recurrent partial nail growth despite the ablation. We'll see her back in 1 week postop.  Follow-Up Instructions: Return in about 1 week (around 05/19/2017).   Orders:  Orders Placed This Encounter  Procedures  . Large Joint Injection/Arthrocentesis   No orders of the defined types were placed in this encounter.     Procedures: Large Joint Inj Date/Time: 05/12/2017 11:40 AM Performed by: Pete Pelt Authorized by: Pete Pelt   Consent Given by:  Patient Indications:  Pain Location:  Hip Site:  L greater trochanter Needle Size:  22 G Needle Length:  1.5 inches and 3.5 inches Approach:  Lateral Ultrasound Guidance: No   Fluoroscopic Guidance: No   Arthrogram: No   Medications:  40 mg methylPREDNISolone acetate 40 MG/ML; 3 mL lidocaine 1 % Aspiration Attempted: No   Patient tolerance:  Patient tolerated the procedure well with no immediate complications     Clinical Data: No additional findings.   Subjective: Chief Complaint  Patient presents with  . Right Great Toe - Nail Problem    History of ingrown nail. Has had this removed and had allergy to numbing medication. She developed a blood blister, and feels that toe stay infected and ingrown nail keeps reoccurring. She would like  nail bed deaden.   . Left Hip - Pain    History of L THA 07/17/16. Groin pain. Denies pain with walking.    HPI Cassandra Johnston well-known Dr. Trevor Mace service comes in today due to left lateral hip pain. She has pain whenever she is putting her leg on the brake in a car and has to help with arm at 2 kids the foot there. She has some pain at night when she lies on the hip and cannot lie on it for long period time. No pain with walking. She status post left total hip arthroplasty 07/17/2016. Has no real groin pain. She had no fevers chills. Second complaint is right foot great toe ingrown nail she is seen with diet wrist and has had a excision of the nail however portion of nails growing back simply she has a spicule at times that she trims with the nail clippers. Some pain medial border of nail. She states she wants the nail removed permanently. Review of Systems  No fevers chills shortness breath or chest pain. No radicular symptoms down the left leg or back pain. Otherwise please see history of present illness. Objective: Vital Signs: Ht 5' 7.5" (1.715 m)   Wt 191 lb (86.6 kg)   BMI 29.47 kg/m   Physical Exam  Constitutional: She is oriented to person, place, and  time. She appears well-developed and well-nourished. No distress.  Pulmonary/Chest: Effort normal.  Neurological: She is alert and oriented to person, place, and time.  Skin: She is not diaphoretic.  Psychiatric: She has a normal mood and affect. Her behavior is normal.    Ortho Exam Left hip excellent range of motion without pain. She has tenderness over the left greater trochanteric region. Left surgical incision is healed well no signs of infection. Right great toenail that ingrown nail lateral border. New nail growth from previous removal. No expressible purulence. There is tenderness along the lateral border with palpation of the right great toe. Specialty Comments:  No specialty comments available.  Imaging: No results  found.   PMFS History: Patient Active Problem List   Diagnosis Date Noted  . Essential tremor 02/03/2017  . Peripheral neuropathy 02/03/2017  . Osteoarthritis of left hip 07/17/2016  . Status post left hip replacement 07/17/2016  . Chest pain 09/24/2015  . Shortness of breath 09/24/2015  . Palpitations 09/24/2015  . Breast cancer, left breast ([place]) 01/04/2014  . Malaise and fatigue 09/15/2013  . Cellulitis of breast 11/27/2011  . HYPERLIPIDEMIA 09/25/2011  . DM 04/24/2009  . ESOPHAGEAL STRICTURE 04/24/2009  . GERD 04/24/2009  . IRRITABLE BOWEL SYNDROME 04/24/2009  . DIARRHEA 04/24/2009  . COLONIC POLYPS, HX OF 04/24/2009   Past Medical History:  Diagnosis Date  . Anxiety   . Arthritis   . Breast cancer ([place]) 2010   left  . Breast infection    left breast  . Chest pain 09/24/2015  . Complication of anesthesia    patient states she becomes combative with anesthesia with some surgeries  . Depression   . Diabetes mellitus   . Diverticulosis   . Esophageal stricture   . Family history of adverse reaction to anesthesia    father was also combative per patient report   . GERD (gastroesophageal reflux disease)   . Heart murmur   . Hypertension   . Palpitations 09/24/2015  . Pneumonia    hx of   . PONV (postoperative nausea and vomiting)   . Shortness of breath 09/24/2015   weight related per patient   . Tremor     Family History  Problem Relation Age of Onset  . Diabetes Mother   . Colon cancer Mother   . Atrial fibrillation Father   . CAD Father   . Pancreatic cancer Father     Past Surgical History:  Procedure Laterality Date  . ABDOMINAL HYSTERECTOMY    . APPENDECTOMY    . BREAST LUMPECTOMY  2010  . CHOLECYSTECTOMY    . COLONOSCOPY    . CYSTECTOMY     on thyroid  . FRACTURE SURGERY     feet  . HAND SURGERY    . TONSILLECTOMY    . TOTAL HIP ARTHROPLASTY Left 07/17/2016   Procedure: LEFT TOTAL HIP ARTHROPLASTY ANTERIOR APPROACH;  Surgeon: Mcarthur Rossetti, MD;  Location: [facility];  Service: Orthopedics;  Laterality: Left;  . TUBAL LIGATION     Social History   Occupational History  . Retired    Social History Main Topics  . Smoking status: Former Smoker    Quit date: 12/01/2003  . Smokeless tobacco: Never Used     Comment: Quit 10+ years ago  . Alcohol use No     Comment: Quit 25+ years ago.  . Drug use: No  . Sexual activity: Not on file

## 2017-05-13 ENCOUNTER — Other Ambulatory Visit: Payer: Self-pay | Admitting: Family Medicine

## 2017-05-20 ENCOUNTER — Encounter: Payer: Self-pay | Admitting: Physician Assistant

## 2017-05-20 ENCOUNTER — Encounter: Payer: Self-pay | Admitting: Orthopaedic Surgery

## 2017-05-20 DIAGNOSIS — L600 Ingrowing nail: Secondary | ICD-10-CM | POA: Diagnosis not present

## 2017-06-08 ENCOUNTER — Telehealth (INDEPENDENT_AMBULATORY_CARE_PROVIDER_SITE_OTHER): Payer: Self-pay | Admitting: Orthopaedic Surgery

## 2017-06-08 NOTE — Telephone Encounter (Signed)
Called patient left message on voicemail to return call to schedule post op appointment with Dr Ninfa Linden or Artis Delay

## 2017-08-22 IMAGING — DX DG HIP (WITH OR WITHOUT PELVIS) 2-3V*L*
3 series · 3 of 3 positions shown · non-contrast
Comparison: None.

CLINICAL DATA: Right hip pain following a fall 1 month ago.

EXAM:
DG HIP (WITH OR WITHOUT PELVIS) 2-3V LEFT

[pelvis ap]
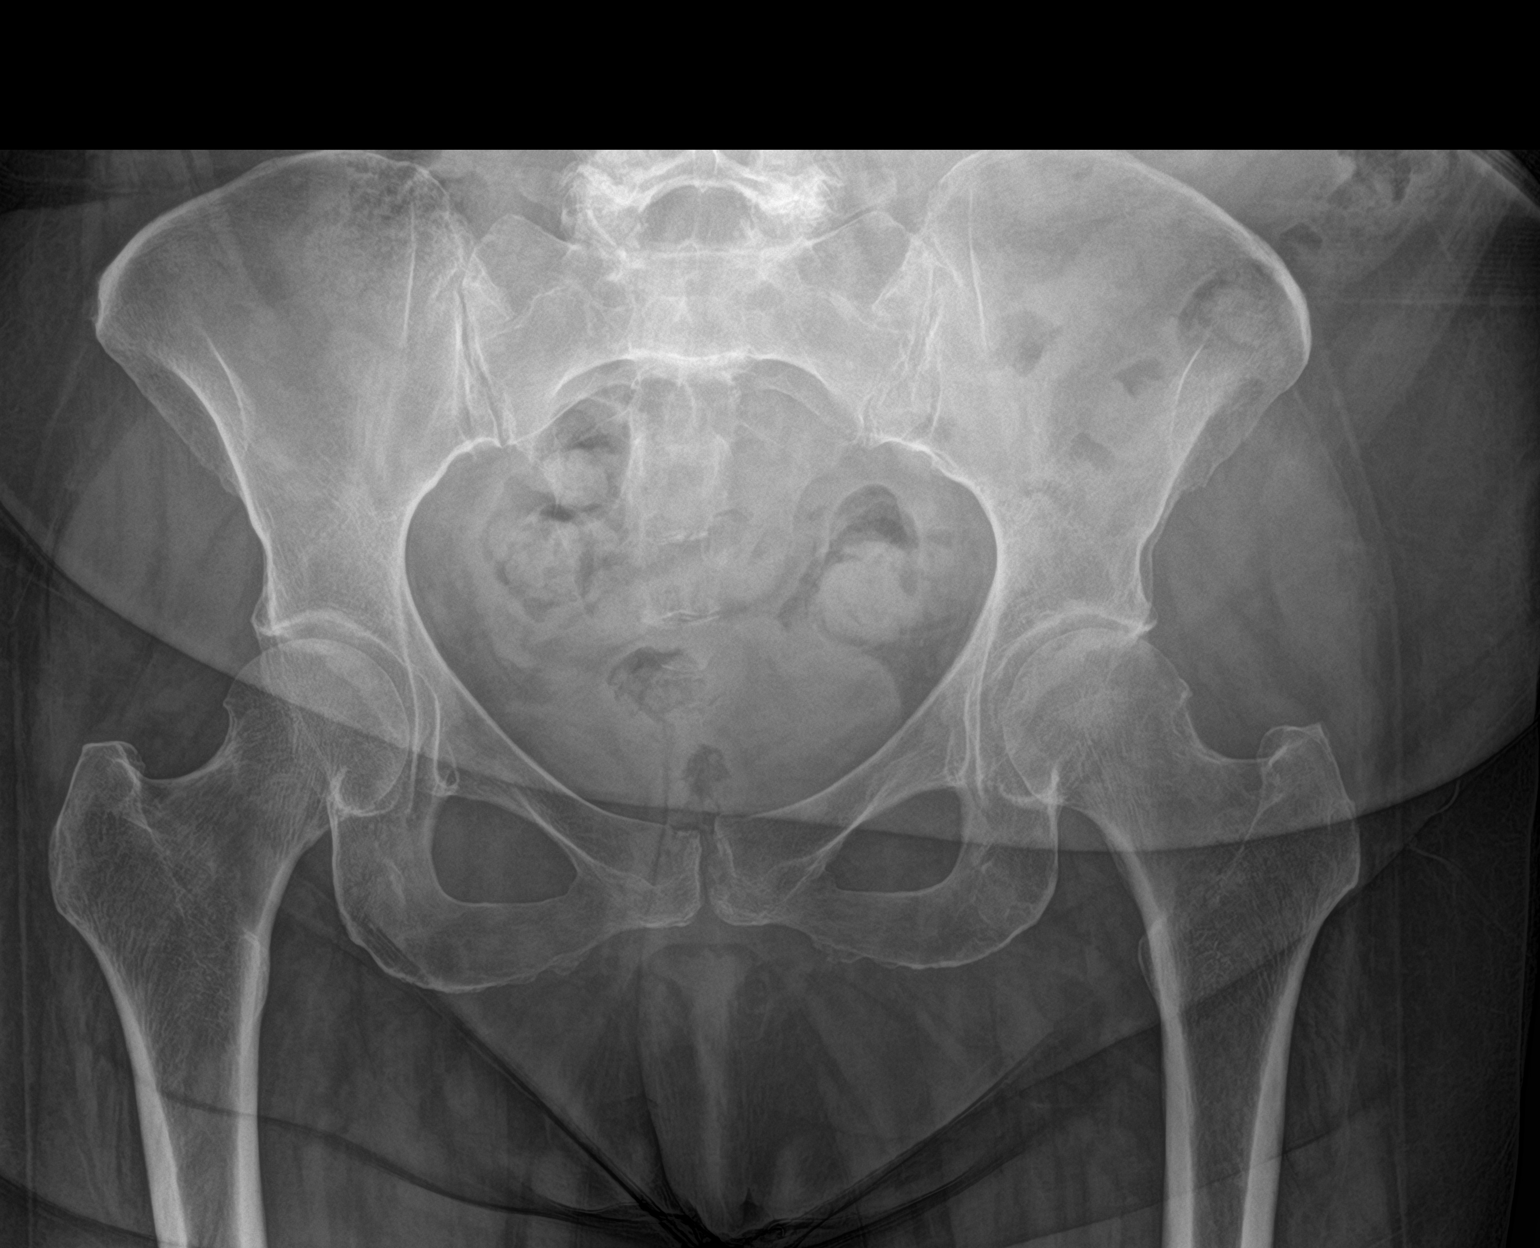

[hip ap]
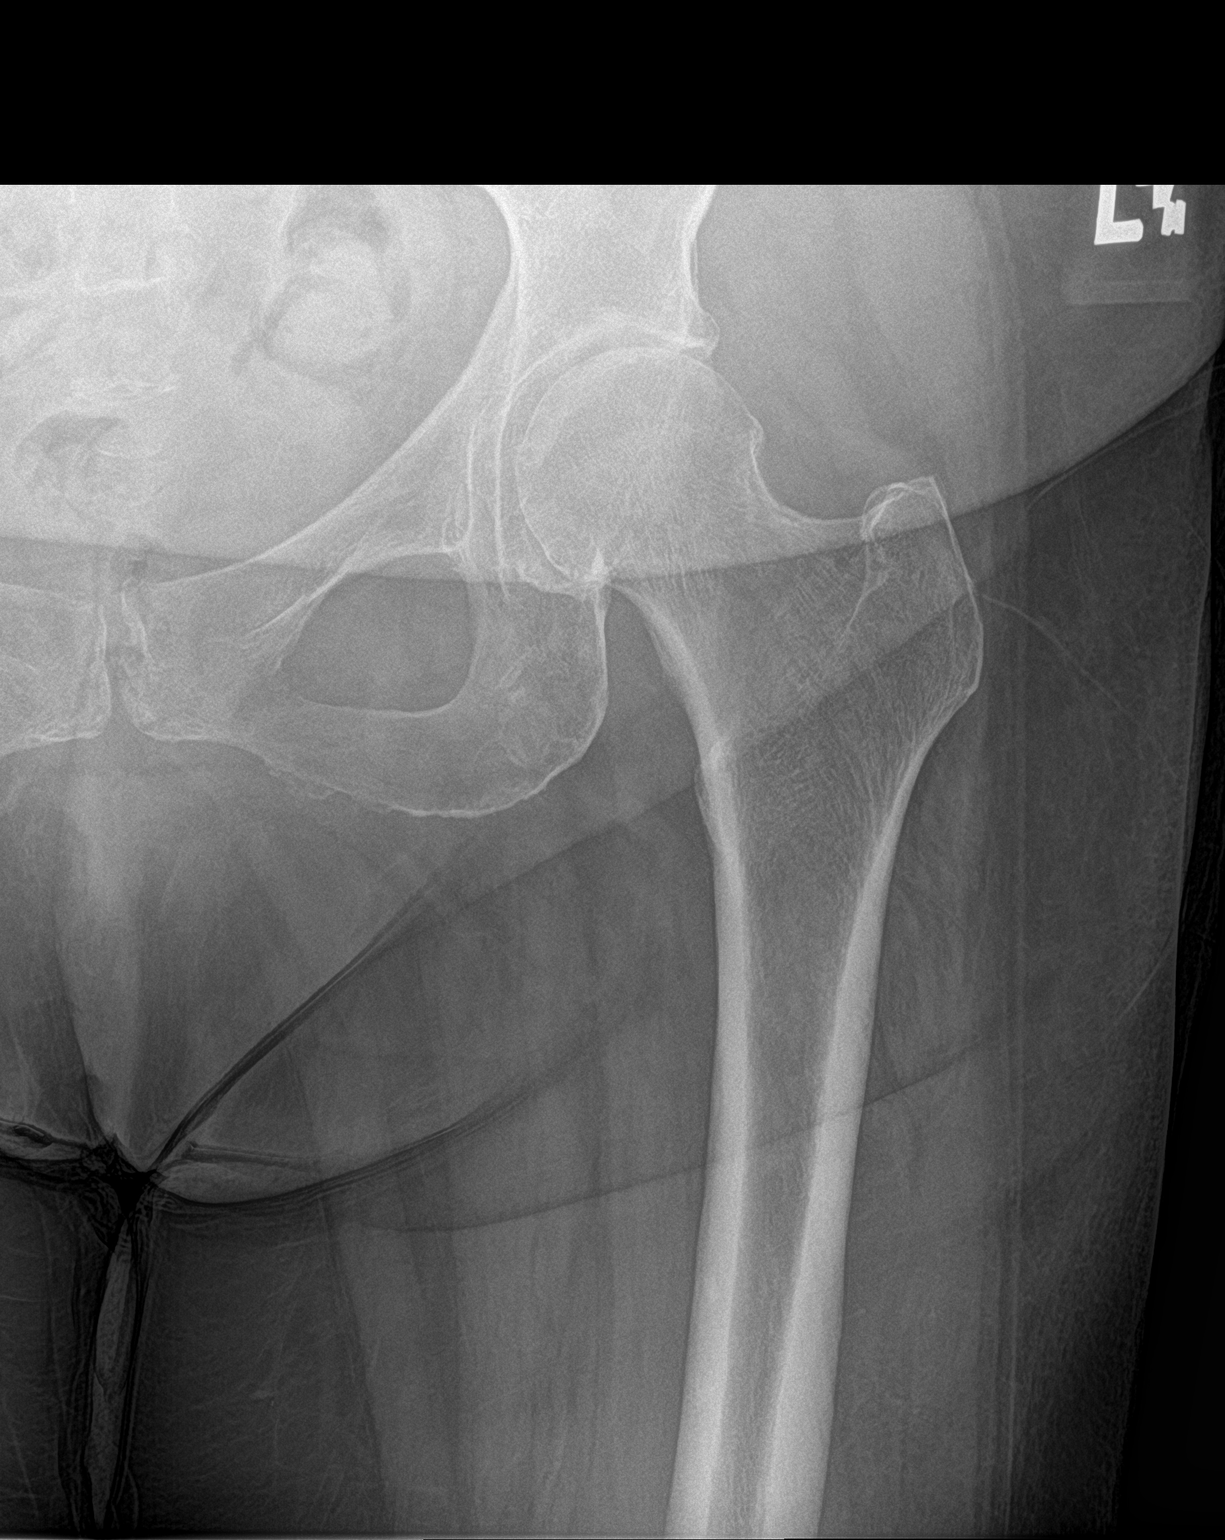

[hip lat]
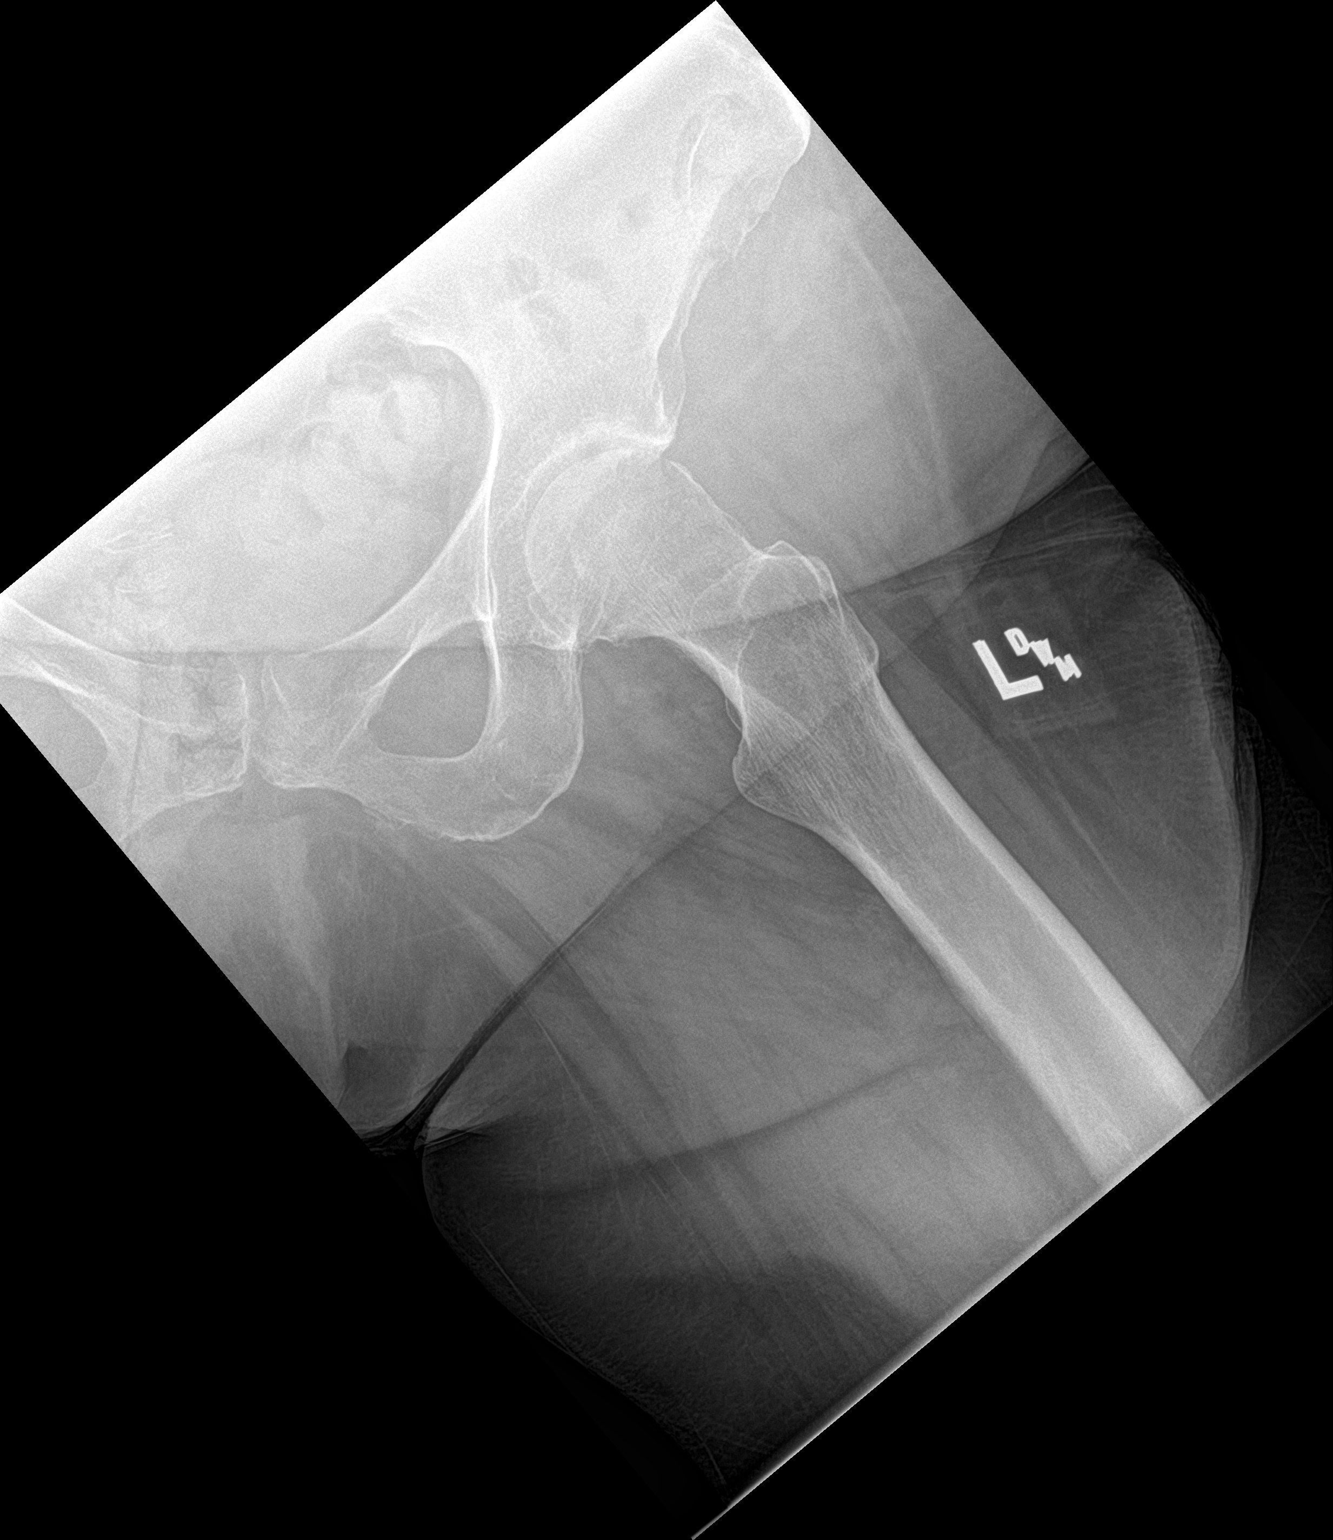

[3 of 3 positions shown; findings below may reference images not displayed]

FINDINGS: Mild bilateral femoral head and neck junction spur formation. No
fracture or dislocation seen. Lower lumbar spine degenerative
changes. Diffuse osteopenia.
IMPRESSION: 1. No fracture or dislocation seen.
2. Mild bilateral hip degenerative changes.
3. Lower lumbar spine degenerative changes.

## 2018-09-05 LAB — HEMOGLOBIN A1C: Hemoglobin A1C: 9.1

## 2018-09-05 LAB — HEPATIC FUNCTION PANEL
ALT: 37 — AB (ref 7–35)
AST: 31 (ref 13–35)

## 2018-09-05 LAB — BASIC METABOLIC PANEL
BUN: 13 (ref 4–21)
Creatinine: 0.8 (ref 0.5–1.1)
Potassium: 4.3 (ref 3.4–5.3)
Sodium: 137 (ref 137–147)

## 2019-01-11 ENCOUNTER — Other Ambulatory Visit: Payer: Self-pay | Admitting: Family Medicine

## 2019-01-11 DIAGNOSIS — Z1231 Encounter for screening mammogram for malignant neoplasm of breast: Secondary | ICD-10-CM

## 2019-02-10 ENCOUNTER — Ambulatory Visit: Payer: Medicare Other

## 2019-03-07 ENCOUNTER — Ambulatory Visit: Payer: Medicare Other

## 2019-04-14 ENCOUNTER — Ambulatory Visit
Admission: RE | Admit: 2019-04-14 | Discharge: 2019-04-14 | Disposition: A | Discharge status: Home / Self Care | Payer: Medicare Other | Source: Ambulatory Visit | Attending: Family Medicine | Admitting: Family Medicine

## 2019-04-14 ENCOUNTER — Other Ambulatory Visit: Payer: Self-pay

## 2019-04-14 DIAGNOSIS — Z1231 Encounter for screening mammogram for malignant neoplasm of breast: Secondary | ICD-10-CM

## 2019-04-14 HISTORY — DX: Personal history of irradiation: Z92.3

## 2019-04-19 ENCOUNTER — Ambulatory Visit: Payer: Medicare Other

## 2019-08-01 ENCOUNTER — Ambulatory Visit: Payer: Medicare Other | Admitting: Internal Medicine

## 2019-08-05 ENCOUNTER — Encounter: Payer: Self-pay | Admitting: Family Medicine

## 2019-08-09 LAB — TSH: TSH: 2 (ref 0.41–5.90)

## 2019-09-07 ENCOUNTER — Ambulatory Visit (INDEPENDENT_AMBULATORY_CARE_PROVIDER_SITE_OTHER): Payer: Medicare Other | Admitting: Family Medicine

## 2019-09-07 ENCOUNTER — Encounter: Payer: Self-pay | Admitting: Family Medicine

## 2019-09-07 ENCOUNTER — Other Ambulatory Visit: Payer: Self-pay

## 2019-09-07 ENCOUNTER — Ambulatory Visit (INDEPENDENT_AMBULATORY_CARE_PROVIDER_SITE_OTHER): Payer: Medicare Other

## 2019-09-07 VITALS — BP 128/74 | HR 72 | Temp 97.7°F | Resp 18 | Ht 64.0 in | Wt 188.4 lb

## 2019-09-07 DIAGNOSIS — R0600 Dyspnea, unspecified: Secondary | ICD-10-CM

## 2019-09-07 DIAGNOSIS — M170 Bilateral primary osteoarthritis of knee: Secondary | ICD-10-CM

## 2019-09-07 DIAGNOSIS — E118 Type 2 diabetes mellitus with unspecified complications: Secondary | ICD-10-CM | POA: Diagnosis not present

## 2019-09-07 DIAGNOSIS — G63 Polyneuropathy in diseases classified elsewhere: Secondary | ICD-10-CM

## 2019-09-07 DIAGNOSIS — E1169 Type 2 diabetes mellitus with other specified complication: Secondary | ICD-10-CM

## 2019-09-07 DIAGNOSIS — Z9889 Other specified postprocedural states: Secondary | ICD-10-CM

## 2019-09-07 DIAGNOSIS — E782 Mixed hyperlipidemia: Secondary | ICD-10-CM

## 2019-09-07 DIAGNOSIS — F411 Generalized anxiety disorder: Secondary | ICD-10-CM

## 2019-09-07 DIAGNOSIS — Z794 Long term (current) use of insulin: Secondary | ICD-10-CM

## 2019-09-07 DIAGNOSIS — R0602 Shortness of breath: Secondary | ICD-10-CM

## 2019-09-07 DIAGNOSIS — M810 Age-related osteoporosis without current pathological fracture: Secondary | ICD-10-CM

## 2019-09-07 DIAGNOSIS — K635 Polyp of colon: Secondary | ICD-10-CM

## 2019-09-07 DIAGNOSIS — K580 Irritable bowel syndrome with diarrhea: Secondary | ICD-10-CM

## 2019-09-07 DIAGNOSIS — F339 Major depressive disorder, recurrent, unspecified: Secondary | ICD-10-CM

## 2019-09-07 DIAGNOSIS — I10 Essential (primary) hypertension: Secondary | ICD-10-CM

## 2019-09-07 DIAGNOSIS — Z8719 Personal history of other diseases of the digestive system: Secondary | ICD-10-CM

## 2019-09-07 DIAGNOSIS — Z23 Encounter for immunization: Secondary | ICD-10-CM

## 2019-09-07 HISTORY — DX: Age-related osteoporosis without current pathological fracture: M81.0

## 2019-09-07 HISTORY — DX: Essential (primary) hypertension: I10

## 2019-09-07 HISTORY — DX: Other specified postprocedural states: Z98.890

## 2019-09-07 LAB — CBC WITH DIFFERENTIAL/PLATELET
Basophils Absolute: 0.4 10*3/uL — ABNORMAL HIGH (ref 0.0–0.1)
Basophils Relative: 2.5 % (ref 0.0–3.0)
Eosinophils Absolute: 0.4 10*3/uL (ref 0.0–0.7)
Eosinophils Relative: 2.5 % (ref 0.0–5.0)
HCT: 45.7 % (ref 36.0–46.0)
Hemoglobin: 15.1 g/dL — ABNORMAL HIGH (ref 12.0–15.0)
Lymphocytes Relative: 34.5 % (ref 12.0–46.0)
Lymphs Abs: 5.8 10*3/uL — ABNORMAL HIGH (ref 0.7–4.0)
MCHC: 32.9 g/dL (ref 30.0–36.0)
MCV: 88.3 fl (ref 78.0–100.0)
Monocytes Absolute: 1.1 10*3/uL — ABNORMAL HIGH (ref 0.1–1.0)
Monocytes Relative: 6.4 % (ref 3.0–12.0)
Neutro Abs: 9 10*3/uL — ABNORMAL HIGH (ref 1.4–7.7)
Neutrophils Relative %: 54.1 % (ref 43.0–77.0)
Platelets: 254 10*3/uL (ref 150.0–400.0)
RBC: 5.18 Mil/uL — ABNORMAL HIGH (ref 3.87–5.11)
RDW: 14.5 % (ref 11.5–15.5)
WBC: 16.7 10*3/uL — ABNORMAL HIGH (ref 4.0–10.5)

## 2019-09-07 LAB — LIPID PANEL
Cholesterol: 152 mg/dL (ref 0–200)
HDL: 69.9 mg/dL (ref >39.00)
LDL Cholesterol: 53 mg/dL (ref 0–99)
NonHDL: 82.09
Total CHOL/HDL Ratio: 2
Triglycerides: 145 mg/dL (ref 0.0–149.0)
VLDL: 29 mg/dL (ref 0.0–40.0)

## 2019-09-07 LAB — SEDIMENTATION RATE: Sed Rate: 19 mm/hr (ref 0–30)

## 2019-09-07 LAB — COMPREHENSIVE METABOLIC PANEL
ALT: 27 U/L (ref 0–35)
AST: 29 U/L (ref 0–37)
Albumin: 4.7 g/dL (ref 3.5–5.2)
Alkaline Phosphatase: 133 U/L — ABNORMAL HIGH (ref 39–117)
BUN: 15 mg/dL (ref 6–23)
CO2: 29 mEq/L (ref 19–32)
Calcium: 10.9 mg/dL — ABNORMAL HIGH (ref 8.4–10.5)
Chloride: 99 mEq/L (ref 96–112)
Creatinine, Ser: 0.88 mg/dL (ref 0.40–1.20)
GFR: 63.83 mL/min (ref >60.00)
Glucose, Bld: 119 mg/dL — ABNORMAL HIGH (ref 70–99)
Potassium: 4.7 mEq/L (ref 3.5–5.1)
Sodium: 140 mEq/L (ref 135–145)
Total Bilirubin: 0.7 mg/dL (ref 0.2–1.2)
Total Protein: 7.7 g/dL (ref 6.0–8.3)

## 2019-09-07 LAB — TSH: TSH: 2.63 u[IU]/mL (ref 0.35–4.50)

## 2019-09-07 LAB — HEMOGLOBIN A1C: Hgb A1c MFr Bld: 9.1 % — ABNORMAL HIGH (ref 4.6–6.5)

## 2019-09-07 MED ORDER — BUSPIRONE HCL 7.5 MG PO TABS: 7.5000 mg | ORAL_TABLET | Freq: Two times a day (BID) | ORAL | 5 refills | Status: DC
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
## 2019-09-07 NOTE — Patient Instructions (Addendum)
Please return in 6-8 weeks for your annual complete physical and follow up.   I will release your lab results to you on your MyChart account with further instructions. Please reply with any questions.   Please start the buspar to see if this helps with your anxiety and feeling of shortness of breath.   Today you were given your flu and pneumovax vaccination.   We will call you with information regarding your referral appointment. Bone Density. We will call you to schedule.  If you do not hear from [place] within the next 2 weeks, please let me know.  It was a pleasure meeting you today! Thank you for choosing [place] to meet your healthcare needs! I truly look forward to working with you. If you have any questions or concerns, please send me a message via Mychart or call the office at [phone].   Diabetes Mellitus and Nutrition, Adult When you have diabetes (diabetes mellitus), it is very important to have healthy eating habits because your blood sugar (glucose) levels are greatly affected by what you eat and drink. Eating healthy foods in the appropriate amounts, at about the same times every day, can help you:  Control your blood glucose.  Lower your risk of heart disease.  Improve your blood pressure.  Reach or maintain a healthy weight. Every person with diabetes is different, and each person has different needs for a meal plan. Your health care provider may recommend that you work with a diet and nutrition specialist (dietitian) to make a meal plan that is best for you. Your meal plan may vary depending on factors such as:  The calories you need.  The medicines you take.  Your weight.  Your blood glucose, blood pressure, and cholesterol levels.  Your activity level.  Other health conditions you have, such as heart or kidney disease. How do carbohydrates affect me? Carbohydrates, also called carbs, affect your blood glucose level more than any other type of food. Eating carbs  naturally raises the amount of glucose in your blood. Carb counting is a method for keeping track of how many carbs you eat. Counting carbs is important to keep your blood glucose at a healthy level, especially if you use insulin or take certain oral diabetes medicines. It is important to know how many carbs you can safely have in each meal. This is different for every person. Your dietitian can help you calculate how many carbs you should have at each meal and for each snack. Foods that contain carbs include:  Bread, cereal, rice, pasta, and crackers.  Potatoes and corn.  Peas, beans, and lentils.  Milk and yogurt.  Fruit and juice.  Desserts, such as cakes, cookies, ice cream, and candy. How does alcohol affect me? Alcohol can cause a sudden decrease in blood glucose (hypoglycemia), especially if you use insulin or take certain oral diabetes medicines. Hypoglycemia can be a life-threatening condition. Symptoms of hypoglycemia (sleepiness, dizziness, and confusion) are similar to symptoms of having too much alcohol. If your health care provider says that alcohol is safe for you, follow these guidelines:  Limit alcohol intake to no more than 1 drink per day for nonpregnant women and 2 drinks per day for men. One drink equals 12 oz of beer, 5 oz of wine, or 1 oz of hard liquor.  Do not drink on an empty stomach.  Keep yourself hydrated with water, diet soda, or unsweetened iced tea.  Keep in mind that regular soda, juice, and other mixers  may contain a lot of sugar and must be counted as carbs. What are tips for following this plan?  Reading food labels  Start by checking the serving size on the "Nutrition Facts" label of packaged foods and drinks. The amount of calories, carbs, fats, and other nutrients listed on the label is based on one serving of the item. Many items contain more than one serving per package.  Check the total grams (g) of carbs in one serving. You can calculate  the number of servings of carbs in one serving by dividing the total carbs by 15. For example, if a food has 30 g of total carbs, it would be equal to 2 servings of carbs.  Check the number of grams (g) of saturated and trans fats in one serving. Choose foods that have low or no amount of these fats.  Check the number of milligrams (mg) of salt (sodium) in one serving. Most people should limit total sodium intake to less than 2,300 mg per day.  Always check the nutrition information of foods labeled as "low-fat" or "nonfat". These foods may be higher in added sugar or refined carbs and should be avoided.  Talk to your dietitian to identify your daily goals for nutrients listed on the label. Shopping  Avoid buying canned, premade, or processed foods. These foods tend to be high in fat, sodium, and added sugar.  Shop around the outside edge of the grocery store. This includes fresh fruits and vegetables, bulk grains, fresh meats, and fresh dairy. Cooking  Use low-heat cooking methods, such as baking, instead of high-heat cooking methods like deep frying.  Cook using healthy oils, such as olive, canola, or sunflower oil.  Avoid cooking with butter, cream, or high-fat meats. Meal planning  Eat meals and snacks regularly, preferably at the same times every day. Avoid going long periods of time without eating.  Eat foods high in fiber, such as fresh fruits, vegetables, beans, and whole grains. Talk to your dietitian about how many servings of carbs you can eat at each meal.  Eat 4-6 ounces (oz) of lean protein each day, such as lean meat, chicken, fish, eggs, or tofu. One oz of lean protein is equal to: ? 1 oz of meat, chicken, or fish. ? 1 egg. ?  cup of tofu.  Eat some foods each day that contain healthy fats, such as avocado, nuts, seeds, and fish. Lifestyle  Check your blood glucose regularly.  Exercise regularly as told by your health care provider. This may include: ? 150  minutes of moderate-intensity or vigorous-intensity exercise each week. This could be brisk walking, biking, or water aerobics. ? Stretching and doing strength exercises, such as yoga or weightlifting, at least 2 times a week.  Take medicines as told by your health care provider.  Do not use any products that contain nicotine or tobacco, such as cigarettes and e-cigarettes. If you need help quitting, ask your health care provider.  Work with a Social worker or diabetes educator to identify strategies to manage stress and any emotional and social challenges. Questions to ask a health care provider  Do I need to meet with a diabetes educator?  Do I need to meet with a dietitian?  What number can I call if I have questions?  When are the best times to check my blood glucose? Where to find more information:  American Diabetes Association: diabetes.org  Academy of Nutrition and Dietetics: www.eatright.[employer] of Diabetes and Digestive and Kidney  Diseases (NIH): [employer] Summary  A healthy meal plan will help you control your blood glucose and maintain a healthy lifestyle.  Working with a diet and nutrition specialist (dietitian) can help you make a meal plan that is best for you.  Keep in mind that carbohydrates (carbs) and alcohol have immediate effects on your blood glucose levels. It is important to count carbs and to use alcohol carefully. This information is not intended to replace advice given to you by your health care provider. Make sure you discuss any questions you have with your health care provider. Document Released: 08/13/2005 Document Revised: 10/29/2017 Document Reviewed: 12/21/2016 Elsevier Patient Education  2020 [employer].

## 2019-09-08 LAB — HEPATITIS C ANTIBODY
Hepatitis C Ab: NONREACTIVE
SIGNAL TO CUT-OFF: 0.02 (ref <1.00)

## 2019-09-08 MED ORDER — ROSUVASTATIN CALCIUM 10 MG PO TABS: 10.0000 mg | ORAL_TABLET | Freq: Every day | ORAL | 3 refills | Status: DC

## 2019-09-08 NOTE — Addendum Note (Signed)
Addended by: Billey Chang on: 09/08/2019 08:22 AM   Modules accepted: Orders

## 2019-09-11 ENCOUNTER — Encounter: Payer: Self-pay | Admitting: Family Medicine

## 2019-09-11 ENCOUNTER — Ambulatory Visit: Payer: Medicare Other | Admitting: Family Medicine

## 2019-09-11 DIAGNOSIS — D72828 Other elevated white blood cell count: Secondary | ICD-10-CM

## 2019-09-11 NOTE — Telephone Encounter (Signed)
From her recent lab note result: "her WBC count remains elevated and I'm concerned given her h/o abnl lymph node biopsy in the past and increased risk for developing CLL: therefore, she needs to see Dr. Jana Hakim again for blood smear review. Please have her schedule or place the referral. DX: leukocytosis. "  Please place referral. Thanks.

## 2019-09-12 ENCOUNTER — Encounter: Payer: Self-pay | Admitting: Family Medicine

## 2019-09-23 ENCOUNTER — Other Ambulatory Visit: Payer: Self-pay | Admitting: Oncology

## 2019-09-23 DIAGNOSIS — D72829 Elevated white blood cell count, unspecified: Secondary | ICD-10-CM

## 2019-09-25 ENCOUNTER — Telehealth: Payer: Self-pay | Admitting: Oncology

## 2019-09-25 NOTE — Telephone Encounter (Signed)
Scheduled appt per 10/26 sch message - unable to reach pt . Left message with appt date and time

## 2019-09-26 ENCOUNTER — Ambulatory Visit: Payer: Medicare Other | Admitting: Cardiovascular Disease

## 2019-09-26 NOTE — Progress Notes (Deleted)
Cardiology Office Note   Date:  09/26/2019   ID:  Cassandra Johnston, Cassandra Johnston 01-30-1951, MRN [id number]  PCP:  Leamon Arnt, MD  Cardiologist:   Skeet Latch, MD   No chief complaint on file.     History of Present Illness: Cassandra Johnston is a 68 y.o. female with the diabetes type 2, hypertension, depression, breast cancer s/p XRT, and fibromyalgia who presents for an evaluation of dyspnea.  She was initially seen in 2016 for an evaluation of chest pain, shortness of breath and palpitations.  Ms. Badeaux saw Dr. Betty Martinique on 09/05/14. At that appointment she complained of chest pain and her EKG was noted to have anterior T-wave inversions.  She was also noted to have a heart murmur and abnormal heart rhythm.  She was referred for an echo 08/2015 that showed LVEF 55-60% with grade 1 diastolic dysfunction and no significant valvular abnormalities.  Lexiscan Myoview was negative for ischemia.  She wore a 48 hour Holter that showed PVCs, PACs and episodes of atrial tachycardia.  She was started on metoprolol.     . She is currently undergoing a work up for persistently elevated WBC count and abnormal lymph node biopsy.  There is concern for CLL  Dyspnea Lmn bx  Past Medical History:  Diagnosis Date  . Anxiety   . Arthritis   . Breast cancer ([place]) 2010   left  . Breast infection    left breast  . Chest pain 09/24/2015  . Complication of anesthesia    patient states she becomes combative with anesthesia with some surgeries  . Depression   . Diabetes mellitus   . Diverticulosis   . Esophageal stricture   . Essential hypertension 09/07/2019  . Family history of adverse reaction to anesthesia    father was also combative per patient report   . GERD (gastroesophageal reflux disease)   . H/O lymph node biopsy - abnl follicles /B cells [id number]   Dr. Jana Hakim: at risk for developing CLL.  Marland Kitchen Heart murmur   . Hypertension   . Osteoarthritis of left hip 07/17/2016   . Osteoporosis 09/07/2019  . Palpitations 09/24/2015  . Personal history of radiation therapy   . Pneumonia    hx of   . PONV (postoperative nausea and vomiting)   . Shortness of breath 09/24/2015   weight related per patient   . Tremor     Past Surgical History:  Procedure Laterality Date  . ABDOMINAL HYSTERECTOMY    . APPENDECTOMY    . BREAST LUMPECTOMY  2010  . CHOLECYSTECTOMY    . COLONOSCOPY    . CYSTECTOMY     on thyroid  . FRACTURE SURGERY     feet  . HAND SURGERY    . TONSILLECTOMY    . TOTAL HIP ARTHROPLASTY Left 07/17/2016   Procedure: LEFT TOTAL HIP ARTHROPLASTY ANTERIOR APPROACH;  Surgeon: Mcarthur Rossetti, MD;  Location: [facility];  Service: Orthopedics;  Laterality: Left;  . TUBAL LIGATION       Current Outpatient Medications  Medication Sig Dispense Refill  . benazepril-hydrochlorthiazide (LOTENSIN HCT) 10-12.5 MG tablet Take 1 tablet by mouth daily.    . busPIRone (BUSPAR) 7.5 MG tablet Take 1 tablet (7.5 mg total) by mouth 2 (two) times daily. 60 tablet 5  . Continuous Blood Gluc Sensor (FREESTYLE LIBRE 14 DAY SENSOR) MISC USE 1 SENSOR Q 14 DAYS DERMALLY    . DULoxetine (CYMBALTA) 60 MG capsule TK ONE C PO  D    . empagliflozin (JARDIANCE) 25 MG TABS tablet Take 1 tablet by mouth daily.    . Famotidine (PEPCID PO) Take 50 mg by mouth 2 (two) times daily.      . fluconazole (DIFLUCAN) 150 MG tablet TK 1 T PO ONCE AND MAY REPEAT IN 3 DAYS    . glimepiride (AMARYL) 4 MG tablet Take 4 mg by mouth daily with breakfast.    . Insulin Glargine (TOUJEO SOLOSTAR) 300 UNIT/ML SOPN Inject 40 Units into the skin daily.    . metFORMIN (GLUCOPHAGE) 500 MG tablet Take 1,000 mg by mouth 2 (two) times daily.    Glory Rosebush ULTRA test strip TEST TID    . rosuvastatin (CRESTOR) 10 MG tablet Take 1 tablet (10 mg total) by mouth at bedtime. 90 tablet 3  . tiZANidine (ZANAFLEX) 4 MG tablet Take 4 mg by mouth at bedtime.     . TRULICITY 1.5 0000000 SOPN Inject 1.5 mg into the  skin once a week. Tuesdays    . venlafaxine XR (EFFEXOR-XR) 150 MG 24 hr capsule TK 1 C PO QD WF     No current facility-administered medications for this visit.     Allergies:   Naproxen and Sulfa antibiotics    Social History:  The patient  reports that she quit smoking about 15 years ago. She has never used smokeless tobacco. She reports that she does not drink alcohol or use drugs.   Family History:  The patient's family history includes Atrial fibrillation in her father; CAD in her father; Colon cancer in her mother; Diabetes in her mother; Pancreatic cancer in her father.    ROS:  Please see the history of present illness.   Otherwise, review of systems are positive for none.   All other systems are reviewed and negative.    PHYSICAL EXAM: VS:  There were no vitals taken for this visit. , BMI There is no height or weight on file to calculate BMI. GENERAL:  Well appearing HEENT:  Pupils equal round and reactive, fundi not visualized, oral mucosa unremarkable NECK:  No jugular venous distention, waveform within normal limits, carotid upstroke brisk and symmetric, no bruits, no thyromegaly LYMPHATICS:  No cervical adenopathy LUNGS:  Clear to auscultation bilaterally HEART:  RRR.  PMI not displaced or sustained,S1 and S2 within normal limits, no S3, no S4, no clicks, no rubs, II/VI systolic murmur  ABD:  Flat, positive bowel sounds normal in frequency in pitch, no bruits, no rebound, no guarding, no midline pulsatile mass, no hepatomegaly, no splenomegaly EXT:  2 plus pulses throughout, no edema, no cyanosis no clubbing SKIN:  No rashes no nodules NEURO:  Cranial nerves II through XII grossly intact, motor grossly intact throughout PSYCH:  Cognitively intact, oriented to person place and time    EKG:  EKG is ordered today. The ekg ordered today demonstrates sinus rhythm at 81 bpm. Nonspecific ST and T changes.  Echo  10/02/15: Study Conclusions  - Left ventricle: The cavity  size was normal. Wall thickness was   normal. Systolic function was normal. The estimated ejection   fraction was in the range of 55% to 60%. Wall motion was normal;   there were no regional wall motion abnormalities. Doppler   parameters are consistent with abnormal left ventricular   relaxation (grade 1 diastolic dysfunction).  Impressions:  - Normal LV systolic function; grade 1 diastolic dysfunction; trace   MR and TR.  Lexiscan Myoview 11/16:  The left ventricular  ejection fraction is hyperdynamic (>65%).  Nuclear stress EF: 71%.  The study is normal.   Normal stress nuclear study with no ischemia or infarction; EF 71 with normal wall motion.  48 Hour Holter Monitor 08/2015:  Quality: Fair.  Baseline artifact. Predominant rhythm: sinus rhythm  Average heart rate: 79 bpm Max heart rate: 112 bpm Min heart rate: 47 bpm Pauses >2.5 seconds: 0 62 Isolated PVCs Morphology: monomorphic Supraventricular ectopics: 1682 One 4-18 beat runs of atrial tach. Patient did not submit a symptom diary  Recent Labs: 09/07/2019: ALT 27; BUN 15; Creatinine, Ser 0.88; Hemoglobin 15.1; Platelets 254.0; Potassium 4.7; Sodium 140; TSH 2.63   05/2015: A1c 11.4 Na 134, K 4.1, BUN 12, Creatinine 0.71 TSH 5.266, fT4 0.82, T3 2.6 Chol 180, tri 163, HDL 52, LDL 96  Lipid Panel    Component Value Date/Time   CHOL 152 09/07/2019 0955   TRIG 145.0 09/07/2019 0955   HDL 69.90 09/07/2019 0955   CHOLHDL 2 09/07/2019 0955   VLDL 29.0 09/07/2019 0955   LDLCALC 53 09/07/2019 0955      Wt Readings from Last 3 Encounters:  09/07/19 188 lb 6.4 oz (85.5 kg)  05/12/17 191 lb (86.6 kg)  02/03/17 191 lb 12 oz (87 kg)      ASSESSMENT AND PLAN:  # Chest pain:  Ms. Mcdevitt's chest pain is concerning for ischemia. We will refer her for exercise Cardiolite.  # Shortness of breath:  It is unclear whether the symptoms are due to ischemia or heart failure. Could also be due to primary pulmonary  disease. We will obtain an echo to evaluate for structural heart disease and a stress test as above to rule out ischemia. She does not appear volume overloaded at this time.   # Hypertension: Ms. Burgio's blood pressure is poorly controlled today. She states that it typically is much better controlled and thinks that she may have an element of white coat hypertension. We have asked her to check her blood pressure over the next 2 weeks and call us if her blood pressure is not consistently under 140/90. For now she will continue benazepril   # Palpitations: Ms. Burne has some ectopy on exam today that was also noted that her prior appointment. The EKG today is normal. We will obtain a 48 hour Holter to attempt to catch these rhythms and associate them with her symptoms. She has already undergone thyroid function testing, CBC, and basic metabolic panel that were unremarkable.   Current medicines are reviewed at length with the patient today.  The patient does not have concerns regarding medicines.  The following changes have been made:  no change  Labs/ tests ordered today include:   No orders of the defined types were placed in this encounter.    Disposition:   FU with Kyrin Gratz C. Oval Linsey, MD in 6 months.   Signed, Skeet Latch, MD  09/26/2019 5:53 AM    [place]

## 2019-09-29 ENCOUNTER — Other Ambulatory Visit: Payer: Self-pay

## 2019-09-29 ENCOUNTER — Inpatient Hospital Stay: Payer: Medicare Other | Attending: Oncology

## 2019-09-29 DIAGNOSIS — D72829 Elevated white blood cell count, unspecified: Secondary | ICD-10-CM

## 2019-09-29 DIAGNOSIS — Z853 Personal history of malignant neoplasm of breast: Secondary | ICD-10-CM | POA: Diagnosis not present

## 2019-09-29 LAB — CBC WITH DIFFERENTIAL/PLATELET
Abs Immature Granulocytes: 0.04 10*3/uL (ref 0.00–0.07)
Basophils Absolute: 0.3 10*3/uL — ABNORMAL HIGH (ref 0.0–0.1)
Basophils Relative: 2 %
Eosinophils Absolute: 0.4 10*3/uL (ref 0.0–0.5)
Eosinophils Relative: 3 %
HCT: 44.9 % (ref 36.0–46.0)
Hemoglobin: 15 g/dL (ref 12.0–15.0)
Immature Granulocytes: 0 %
Lymphocytes Relative: 37 %
Lymphs Abs: 4.7 10*3/uL — ABNORMAL HIGH (ref 0.7–4.0)
MCH: 28.6 pg (ref 26.0–34.0)
MCHC: 33.4 g/dL (ref 30.0–36.0)
MCV: 85.5 fL (ref 80.0–100.0)
Monocytes Absolute: 1 10*3/uL (ref 0.1–1.0)
Monocytes Relative: 8 %
Neutro Abs: 6.2 10*3/uL (ref 1.7–7.7)
Neutrophils Relative %: 50 %
Platelets: 213 10*3/uL (ref 150–400)
RBC: 5.25 MIL/uL — ABNORMAL HIGH (ref 3.87–5.11)
RDW: 13.8 % (ref 11.5–15.5)
WBC: 12.8 10*3/uL — ABNORMAL HIGH (ref 4.0–10.5)
nRBC: 0 % (ref 0.0–0.2)

## 2019-09-29 LAB — SAVE SMEAR(SSMR), FOR PROVIDER SLIDE REVIEW

## 2019-10-10 ENCOUNTER — Other Ambulatory Visit: Payer: Self-pay | Admitting: Family Medicine

## 2019-10-10 DIAGNOSIS — M810 Age-related osteoporosis without current pathological fracture: Secondary | ICD-10-CM

## 2019-10-12 NOTE — Progress Notes (Signed)
ID: Cassandra Johnston   DOB: Dec 31, 1950  MR#: [id number]  CBS#:[id number]  Patient Care Team: Leamon Arnt, MD as PCP - General (Family Medicine) Alphonsa Overall, MD (General Surgery) Liandra Mendia, Virgie Dad, MD (Hematology and Oncology) Madelin Rear, MD as Consulting Physician (Endocrinology) Mcarthur Rossetti, MD as Consulting Physician (Orthopedic Surgery) Marcial Pacas, MD as Consulting Physician (Neurology) Regal, Tamala Fothergill, DPM as Consulting Physician (Podiatry) Skeet Latch, MD as Attending Physician (Cardiology)  CHIEF COMPLAINT:  Left Breast Cancer; benign lymphocytosis  CURRENT TREATMENT: Observation   INTERVAL HISTORY: Cassandra Johnston returns today for followup of her left breast carcinoma and persistent lymphocytosis.   Since her last visit, her most recent bilateral screening mammography with tomography at [facility] on 04/14/2019 showed: breast density category A; no evidence of malignancy in either breast.    She underwent left hip total arthroplasty on 07/17/2016 under Dr. Lamarr Lulas.  She also presented with shortness of breath on 09/07/2019. She underwent chest x-ray at that time which showed no active cardiopulmonary disease.  We have been following her lymphocyte count. Results for Cassandra Johnston, Cassandra Johnston (MRN [id number]) as of 10/13/2019 13:14  Ref. Range 03/23/2012 13:09 11/16/2012 08:38 09/08/2013 10:17 09/15/2013 12:22 12/28/2013 07:56  lymph# Latest Ref Range: 0.9 - 3.3 10e3/uL 3.5 (H) 3.2 4.3 (H) 4.4 (H) 5.7 (H)   Results for Cassandra Johnston, Cassandra Johnston (MRN [id number]) as of 10/13/2019 13:14  Ref. Range 01/21/2009 10:45 09/07/2019 09:55 09/29/2019 10:51  Lymphocyte # Latest Ref Range: 0.7 - 4.0 K/uL 4.3 (H) 5.8 (H) 4.7 (H)   REVIEW OF SYSTEMS: Cassandra Johnston had a difficult spring and summer.  She had severe fatigue.  She could not even talk she says.  She says somebody told her this was a panic attack.  Then she had pneumonia.  She has developed a rash particularly  over both arms, right greater than left, but also around her left thigh area.  She tried a steroid cream and an antibiotic cream for it but it did not work she says.  Sometimes she is dizzy when she leans back.  She is staying home for the pandemic and her husband and 60 year old granddaughter do the shopping.  She walks about a mile a day and does her housework.  She has significant insomnia, not going to sleep until 1 or 3 AM in the morning and then getting out of bed at 8 AM.  She does take naps.  She is mildly constipated.  She tells me her A1c was greater than 9 in June and she was started on Jardiance at that time.  She is not on a strict diet.  A detailed review of systems today was otherwise stable.   HISTORY OF PRESENT ILLNESS: From the original intake note:  Cassandra Johnston was diagnosed with a left invasive ductal carcinoma in early 2010. She underwent left lumpectomy and sentinel lymph node biopsy in February 2010 for a T1 B. N0, grade 1 invasive ductal carcinoma. Tumor was strongly ER and PR positive, HER-2/neu negative, with a low MIB-1.  Cassandra Johnston underwent radiation therapy which was completed in May of 2010. At that time she began on tamoxifen, 20 mg daily.  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 There has been no evidence, however, of lymphoma, either by films or clinically.  Additional treatment history is as detailed below.   PAST MEDICAL HISTORY: Past Medical History:  Diagnosis Date  . Anxiety   . Arthritis   .  Breast cancer ([place]) 2010   left  . Breast infection    left breast  . Chest pain 09/24/2015  . Complication of anesthesia    patient states she becomes combative with anesthesia with some surgeries  . Depression   . Diabetes mellitus   . Diverticulosis   . Esophageal stricture   . Essential hypertension 09/07/2019  . Family history of adverse reaction to anesthesia    father  was also combative per patient report   . GERD (gastroesophageal reflux disease)   . H/O lymph node biopsy - abnl follicles /B cells 77/06/2422   Dr. Jana Hakim: at risk for developing CLL.  Marland Kitchen Heart murmur   . Hypertension   . Osteoarthritis of left hip 07/17/2016  . Osteoporosis 09/07/2019  . Palpitations 09/24/2015  . Personal history of radiation therapy   . Pneumonia    hx of   . PONV (postoperative nausea and vomiting)   . Shortness of breath 09/24/2015   weight related per patient   . Tremor     PAST SURGICAL HISTORY: Past Surgical History:  Procedure Laterality Date  . ABDOMINAL HYSTERECTOMY    . APPENDECTOMY    . BREAST LUMPECTOMY  2010  . CHOLECYSTECTOMY    . COLONOSCOPY    . CYSTECTOMY     on thyroid  . FRACTURE SURGERY     feet  . HAND SURGERY    . TONSILLECTOMY    . TOTAL HIP ARTHROPLASTY Left 07/17/2016   Procedure: LEFT TOTAL HIP ARTHROPLASTY ANTERIOR APPROACH;  Surgeon: Mcarthur Rossetti, MD;  Location: [facility];  Service: Orthopedics;  Laterality: Left;  . TUBAL LIGATION      FAMILY HISTORY Family History  Problem Relation Age of Onset  . Diabetes Mother   . Colon cancer Mother   . Atrial fibrillation Father   . CAD Father   . Pancreatic cancer Father     GYNECOLOGIC HISTORY:   She is GX P2, first pregnancy to term age 25.  She had her hysterectomy at age 12.  She never took hormones.  She started having hot flashes about 5 or 6 years ago.   SOCIAL HISTORY: (updated Dec 2013)  She previously worked for [employer].  Her son, Melvern Sample, works in the [place], is married to Vanna Scotland, who also works there.  The patient's father, Lavone Neri, used to work in the ER as well.  The patient's daughter, Santa Genera, works at [employer].  The patient lives at home with her husband Marcello Moores, and keeps 2 of her daughter, Rebbeca Paul, 3 children who are 10 and 16.  She attends [employer].   ADVANCED DIRECTIVES:   HEALTH MAINTENANCE:  Social History    Tobacco Use  . Smoking status: Former Smoker    Quit date: 12/01/2003    Years since quitting: 15.8  . Smokeless tobacco: Never Used  . Tobacco comment: Quit 10+ years ago  Substance Use Topics  . Alcohol use: No    Comment: Quit 25+ years ago.  . Drug use: No     Colonoscopy: Dr. [name], June 2010  PAP: s/p hysterectomy  Bone density: 2010/ osteoporosis  Lipid panel: UTD, Dr. Redmond Pulling   Allergies  Allergen Reactions  . Naproxen Other (See Comments)    GI  . Sulfa Antibiotics Rash    Current Outpatient Medications  Medication Sig Dispense Refill  . benazepril-hydrochlorthiazide (LOTENSIN HCT) 10-12.5 MG tablet Take 1 tablet by mouth daily.    . busPIRone (BUSPAR) 7.5 MG  tablet Take 1 tablet (7.5 mg total) by mouth 2 (two) times daily. 60 tablet 5  . Continuous Blood Gluc Sensor (FREESTYLE LIBRE 14 DAY SENSOR) MISC USE 1 SENSOR Q 14 DAYS DERMALLY    . DULoxetine (CYMBALTA) 60 MG capsule TK ONE C PO D    . empagliflozin (JARDIANCE) 25 MG TABS tablet Take 1 tablet by mouth daily.    . Famotidine (PEPCID PO) Take 50 mg by mouth 2 (two) times daily.      . fluconazole (DIFLUCAN) 150 MG tablet TK 1 T PO ONCE AND MAY REPEAT IN 3 DAYS    . glimepiride (AMARYL) 4 MG tablet Take 4 mg by mouth daily with breakfast.    . Insulin Glargine (TOUJEO SOLOSTAR) 300 UNIT/ML SOPN Inject 40 Units into the skin daily.    . metFORMIN (GLUCOPHAGE) 500 MG tablet Take 1,000 mg by mouth 2 (two) times daily.    Glory Rosebush ULTRA test strip TEST TID    . rosuvastatin (CRESTOR) 10 MG tablet Take 1 tablet (10 mg total) by mouth at bedtime. 90 tablet 3  . tiZANidine (ZANAFLEX) 4 MG tablet Take 4 mg by mouth at bedtime.     . TRULICITY 1.5 WG/9.5AO SOPN Inject 1.5 mg into the skin once a week. Tuesdays    . venlafaxine XR (EFFEXOR-XR) 150 MG 24 hr capsule TK 1 C PO QD WF     No current facility-administered medications for this visit.     OBJECTIVE: Middle aged white woman who appears stated age 7:    10/13/19 1300  BP: (!) 148/60  Pulse: 75  Resp: 18  Temp: 98.3 F (36.8 C)  SpO2: 100%     Body mass index is 32.2 kg/m.    ECOG FS: 1 Filed Weights   10/13/19 1300  Weight: 187 lb 9.6 oz (85.1 kg)    Sclerae unicteric, EOMs intact Wearing a mask No cervical or supraclavicular adenopathy, no axillary or inguinal adenopathy Lungs no rales or rhonchi Heart regular rate and rhythm Abd soft, nontender, positive bowel sounds MSK no focal spinal tenderness, no upper extremity lymphedema Neuro: nonfocal, well oriented, appropriate affect Breasts: The right breast is benign.  The left breast is status post remote lumpectomy and radiation with no evidence of disease recurrence.  Right upper extremity 10/13/2019     LAB RESULTS: Lab Results  Component Value Date   WBC 12.8 (H) 09/29/2019   NEUTROABS 6.2 09/29/2019   HGB 15.0 09/29/2019   HCT 44.9 09/29/2019   MCV 85.5 09/29/2019   PLT 213 09/29/2019      Chemistry      Component Value Date/Time   NA 140 09/07/2019 0955   NA 137 09/05/2018   NA 139 12/28/2013 0756   K 4.7 09/07/2019 0955   K 4.2 12/28/2013 0756   CL 99 09/07/2019 0955   CL 103 11/16/2012 0838   CO2 29 09/07/2019 0955   CO2 23 12/28/2013 0756   BUN 15 09/07/2019 0955   BUN 13 09/05/2018   BUN 9.8 12/28/2013 0756   CREATININE 0.88 09/07/2019 0955   CREATININE 0.8 12/28/2013 0756      Component Value Date/Time   CALCIUM 10.9 (H) 09/07/2019 0955   CALCIUM 9.9 12/28/2013 0756   ALKPHOS 133 (H) 09/07/2019 0955   ALKPHOS 220 (H) 12/28/2013 0756   AST 29 09/07/2019 0955   AST 62 (H) 12/28/2013 0756   ALT 27 09/07/2019 0955   ALT 75 (H) 12/28/2013 0756   BILITOT  0.7 09/07/2019 0955   BILITOT 1.04 12/28/2013 0756      STUDIES: We discussed the fact that her mammography density is category A which is very favorable  ASSESSMENT: 68 y.o. Cassandra Johnston woman  (1)   status post left lumpectomy and sentinel lymph node biopsy February 2010 for a T1b  N0, stage IA invasive ductal carcinoma,  grade 1, strongly estrogen and progesterone receptor positive, HER2 negative, with a low MIB-1.    (2)  completed radiation therapy May 2010   (3) she started tamoxifen May 2010,discontinued  in July 2014 due to hair loss.    (4)  a sentinel lymph node showed atypical follicles with some BCL-2 positive cells showing a t(14; 18) translocation, but no evidence of lymphoma by history physical labs or PET scan otherwise  (a) benign lymphocytosis  (5)  multiple episodes of mastitis in the left breast--resolved  (6) osteoporosis   PLAN: Casilda Carls is now more than 10 years out from definitive surgery for her breast cancer with no evidence of disease recurrence.  This is very favorable.  As far as breast cancer is concerned all she needs to in follow-up is her yearly mammography and a yearly physician breast exam  She has a mild persistent nonprogressive lymphocytosis.  This is a benign condition.  In most cases it does not progress and hers has not progressed in the last 5 years.  This would require no intervention unless there is a steady rise in the lymphocyte count to greater than 10,000 at which time we would want to do additional studies  I do not have a good explanation for her rash.  As she is going to be seeing her dermatologist soon she says.  I commended her Covid precautions and encouraged her to continue and intensify her exercise program.   At this point I am comfortable releasing her to her primary care and other physicians.  I will be glad to see her again at any point in the future if and when the need arises but as of now are making no further routine appointments for her here.    Chauncey Cruel, MD 10/13/2019 Oncology and Hematology [facility] [place] Tel. [phone]  Joylene Igo [phone]   I, Wilburn Mylar, am acting as scribe for Dr. Virgie Dad. Ulyess Muto.  I, Lurline Del MD, have reviewed the  above documentation for accuracy and completeness, and I agree with the above.

## 2019-10-13 ENCOUNTER — Inpatient Hospital Stay: Payer: Medicare Other | Attending: Oncology | Admitting: Oncology

## 2019-10-13 ENCOUNTER — Other Ambulatory Visit: Payer: Self-pay

## 2019-10-13 ENCOUNTER — Other Ambulatory Visit: Payer: Medicare Other

## 2019-10-13 DIAGNOSIS — C50812 Malignant neoplasm of overlapping sites of left female breast: Secondary | ICD-10-CM | POA: Insufficient documentation

## 2019-10-13 DIAGNOSIS — K5900 Constipation, unspecified: Secondary | ICD-10-CM | POA: Insufficient documentation

## 2019-10-13 DIAGNOSIS — R21 Rash and other nonspecific skin eruption: Secondary | ICD-10-CM | POA: Insufficient documentation

## 2019-10-13 DIAGNOSIS — Z853 Personal history of malignant neoplasm of breast: Secondary | ICD-10-CM | POA: Insufficient documentation

## 2019-10-13 DIAGNOSIS — Z87891 Personal history of nicotine dependence: Secondary | ICD-10-CM | POA: Insufficient documentation

## 2019-10-13 DIAGNOSIS — Z923 Personal history of irradiation: Secondary | ICD-10-CM | POA: Insufficient documentation

## 2019-10-13 DIAGNOSIS — M810 Age-related osteoporosis without current pathological fracture: Secondary | ICD-10-CM | POA: Insufficient documentation

## 2019-10-13 DIAGNOSIS — R42 Dizziness and giddiness: Secondary | ICD-10-CM | POA: Diagnosis not present

## 2019-10-13 DIAGNOSIS — D72820 Lymphocytosis (symptomatic): Secondary | ICD-10-CM | POA: Insufficient documentation

## 2019-10-13 DIAGNOSIS — Z170 Estrogen receptor positive status [ER+]: Secondary | ICD-10-CM | POA: Diagnosis not present

## 2019-10-13 DIAGNOSIS — G4700 Insomnia, unspecified: Secondary | ICD-10-CM | POA: Diagnosis not present

## 2019-10-13 DIAGNOSIS — Z800 Family history of malignant neoplasm of digestive organs: Secondary | ICD-10-CM | POA: Insufficient documentation

## 2019-10-13 DIAGNOSIS — Z90710 Acquired absence of both cervix and uterus: Secondary | ICD-10-CM | POA: Insufficient documentation

## 2019-10-13 HISTORY — DX: Estrogen receptor positive status (ER+): Z17.0

## 2019-10-13 HISTORY — DX: Malignant neoplasm of overlapping sites of left female breast: C50.812

## 2019-10-31 ENCOUNTER — Encounter: Payer: Self-pay | Admitting: Family Medicine

## 2019-10-31 ENCOUNTER — Ambulatory Visit: Payer: Medicare Other | Admitting: Family Medicine

## 2019-11-03 ENCOUNTER — Encounter: Payer: Self-pay | Admitting: Family Medicine

## 2019-11-03 ENCOUNTER — Ambulatory Visit (INDEPENDENT_AMBULATORY_CARE_PROVIDER_SITE_OTHER): Payer: Medicare Other | Admitting: Family Medicine

## 2019-11-03 VITALS — BP 119/79 | HR 77 | Wt 182.5 lb

## 2019-11-03 DIAGNOSIS — M797 Fibromyalgia: Secondary | ICD-10-CM

## 2019-11-03 DIAGNOSIS — I10 Essential (primary) hypertension: Secondary | ICD-10-CM

## 2019-11-03 DIAGNOSIS — F339 Major depressive disorder, recurrent, unspecified: Secondary | ICD-10-CM

## 2019-11-03 DIAGNOSIS — F411 Generalized anxiety disorder: Secondary | ICD-10-CM | POA: Diagnosis not present

## 2019-11-03 DIAGNOSIS — E118 Type 2 diabetes mellitus with unspecified complications: Secondary | ICD-10-CM | POA: Diagnosis not present

## 2019-11-03 DIAGNOSIS — Z794 Long term (current) use of insulin: Secondary | ICD-10-CM

## 2019-11-03 MED ORDER — DULOXETINE HCL 60 MG PO CPEP: 60.0000 mg | ORAL_CAPSULE | Freq: Every day | ORAL | 3 refills | Status: DC

## 2019-11-03 MED ORDER — TIZANIDINE HCL 4 MG PO TABS: 4.0000 mg | ORAL_TABLET | Freq: Every day | ORAL | 3 refills | Status: DC

## 2019-11-03 MED ORDER — FLUCONAZOLE 150 MG PO TABS: ORAL_TABLET | ORAL | 0 refills | Status: DC

## 2019-11-03 MED ORDER — VENLAFAXINE HCL ER 150 MG PO CP24: 150.0000 mg | ORAL_CAPSULE | Freq: Every day | ORAL | 3 refills | Status: DC

## 2019-11-03 NOTE — Progress Notes (Signed)
Virtual Visit via Video Note  Subjective  CC:  Chief Complaint  Patient presents with  . Shortness of Breath  . Anxiety     I connected with [name] on 11/04/19 at  3:20 PM EST by a video enabled telemedicine application and verified that I am speaking with the correct person using two identifiers. Location patient: Home Location provider: [facility] [place], Office Persons participating in the virtual visit: [place], Leamon Arnt, MD Gertie Exon, CMA  I discussed the limitations of evaluation and management by telemedicine and the availability of in person appointments. The patient expressed understanding and agreed to proceed. HPI: Cassandra Johnston is a 68 y.o. female who was contacted today to address the problems listed above in the chief complaint. . Here for f/u after complicated TOC visit.  . Fortunately, she is much improved. We started buspar for uncontrolled anxiety and she is much much better. No longer experiencing sob; feels well. Is able to leave the house again and attend church with anxiety/sob. No AEs. Happier. Continues on mood meds.  . Lymphocytosis, chronic: reviewed notes from oncology: reassuring. No further eval needed. Stable and benign.  . Diabetes per endocrinology. Uncontrolled. Adjusting medications. She hasn't checked her sugars recently. Getting yeast monthly on jardiance with sxs now.   Assessment  1. GAD (generalized anxiety disorder)   2. Essential hypertension   3. Fibromyalgia   4. Major depression, recurrent, chronic ([place])   5. Type 2 diabetes mellitus with complication, with long-term current use of insulin (HCC)      Plan   GAD:  Much improved. conitnue buspar  HTN is controlled   Fibro and mood are stable  DM: work on diet and has f/u with endocrine. Ordered diflucan but recommend discussing with endocrine. May need med change if worsens.   Has cpe visit with me next  month.  I discussed the assessment and treatment plan with the patient. The patient was provided an opportunity to ask questions and all were answered. The patient agreed with the plan and demonstrated an understanding of the instructions.   The patient was advised to call back or seek an in-person evaluation if the symptoms worsen or if the condition fails to improve as anticipated. Follow up: as scheduled. cpe  12/18/2019  Meds ordered this encounter  Medications  . tiZANidine (ZANAFLEX) 4 MG tablet    Sig: Take 1 tablet (4 mg total) by mouth at bedtime.    Dispense:  90 tablet    Refill:  3  . DULoxetine (CYMBALTA) 60 MG capsule    Sig: Take 1 capsule (60 mg total) by mouth daily.    Dispense:  90 capsule    Refill:  3  . venlafaxine XR (EFFEXOR XR) 150 MG 24 hr capsule    Sig: Take 1 capsule (150 mg total) by mouth daily with breakfast.    Dispense:  90 capsule    Refill:  3  . fluconazole (DIFLUCAN) 150 MG tablet    Sig: Take one tablet today; may repeat in 3 days if symptoms persist    Dispense:  2 tablet    Refill:  0      I reviewed the patients updated PMH, FH, and SocHx.    Patient Active Problem List   Diagnosis Date Noted  . Osteoporosis 09/07/2019    Priority: High  . GAD (generalized anxiety disorder) 09/07/2019    Priority: High  .  Essential hypertension 09/07/2019    Priority: High  . H/O lymph node biopsy - abnl follicles /B cells [id number]    Priority: High  . Peripheral neuropathy 02/03/2017    Priority: High  . Breast cancer, left breast ([place]) 01/04/2014    Priority: High  . Combined hyperlipidemia associated with type 2 diabetes mellitus ([place]) 09/25/2011    Priority: High  . Type 2 diabetes mellitus with complication, with long-term current use of insulin ([place]) 04/24/2009    Priority: High  . Fibromyalgia 11/30/2006    Priority: High  . Major depression, recurrent, chronic ([place]) 11/30/1978    Priority: High  . Persistent lymphocytosis  10/13/2019    Priority: Medium  . Bilateral primary osteoarthritis of knee 09/07/2019    Priority: Medium  . Essential tremor 02/03/2017    Priority: Medium  . History of breast cancer 03/23/2012    Priority: Medium  . GERD 04/24/2009    Priority: Medium  . Irritable bowel syndrome with diarrhea 04/24/2009    Priority: Medium  . Colon polyps 04/24/2009    Priority: Medium  . Status post left hip replacement 07/17/2016    Priority: Low  . History of esophageal stricture 04/24/2009    Priority: Low  . Leukocytosis 09/23/2019  . SOB (shortness of breath) 09/24/2015   No outpatient medications have been marked as taking for the 11/03/19 encounter (Office Visit) with Leamon Arnt, MD.    Allergies: Patient is allergic to naproxen and sulfa antibiotics. Family History: Patient family history includes Atrial fibrillation in her father; CAD in her father; Colon cancer in her mother; Diabetes in her mother; Pancreatic cancer in her father. Social History:  Patient  reports that she quit smoking about 15 years ago. She has never used smokeless tobacco. She reports that she does not drink alcohol or use drugs.  Review of Systems: Constitutional: Negative for fever malaise or anorexia Cardiovascular: negative for chest pain Respiratory: negative for SOB or persistent cough Gastrointestinal: negative for abdominal pain  OBJECTIVE Vitals: BP 119/79   Pulse 77   Wt 182 lb 8 oz (82.8 kg)   BMI 31.33 kg/m  General: no acute distress , A&Ox3  Leamon Arnt, MD

## 2019-11-08 ENCOUNTER — Ambulatory Visit: Payer: Medicare Other | Admitting: Family Medicine

## 2019-11-20 ENCOUNTER — Ambulatory Visit: Payer: Medicare Other | Admitting: Physician Assistant

## 2019-12-11 ENCOUNTER — Ambulatory Visit: Payer: Medicare Other | Attending: Internal Medicine

## 2019-12-11 DIAGNOSIS — Z23 Encounter for immunization: Secondary | ICD-10-CM | POA: Insufficient documentation

## 2019-12-11 NOTE — Progress Notes (Signed)
   Covid-19 Vaccination Clinic  Name:  Cassandra Johnston    MRN: [id number] DOB: 1951/08/15  12/11/2019  Cassandra Johnston was observed post Covid-19 immunization for 15 minutes without incidence. She was provided with Vaccine Information Sheet and instruction to access the V-Safe system.   Cassandra Johnston was instructed to call 911 with any severe reactions post vaccine: Marland Kitchen Difficulty breathing  . Swelling of your face and throat  . A fast heartbeat  . A bad rash all over your body  . Dizziness and weakness

## 2019-12-18 ENCOUNTER — Encounter: Payer: Medicare Other | Admitting: Family Medicine

## 2019-12-31 ENCOUNTER — Ambulatory Visit: Payer: Medicare Other

## 2020-01-01 ENCOUNTER — Other Ambulatory Visit: Payer: Medicare Other

## 2020-01-03 ENCOUNTER — Ambulatory Visit: Payer: Medicare Other | Attending: Internal Medicine

## 2020-01-03 DIAGNOSIS — Z23 Encounter for immunization: Secondary | ICD-10-CM | POA: Insufficient documentation

## 2020-01-03 NOTE — Progress Notes (Signed)
COVID-19 Vaccine Information can be found at: ShippingScam.co.uk For questions related to vaccine distribution or appointments, please email [email or web address] or call [phone].   COVID-19 Vaccine Information can be found at: [email or web address] For questions related to vaccine distribution or appointments, please email [email or web address] or call [phone].     Covid-19 Vaccination Clinic  Name:  Cassandra Johnston    MRN: [id number] DOB: 11/09/1951  01/03/2020  [name] was observed post Covid-19 immunization for 15 minutes without incidence. She was provided with Vaccine Information Sheet and instruction to access the V-Safe system.   [name] was instructed to call 911 with any severe reactions post vaccine: Marland Kitchen Difficulty breathing  . Swelling of your face and throat  . A fast heartbeat  . A bad rash all over your body  . Dizziness and weakness    Immunizations Administered    Name Date Dose VIS Date Route   Pfizer COVID-19 Vaccine 01/03/2020 10:45 AM 0.3 mL 11/10/2019 Intramuscular   Manufacturer: [place]   Lot: [id number]   [place]: [id number]

## 2020-01-31 DIAGNOSIS — I10 Essential (primary) hypertension: Secondary | ICD-10-CM | POA: Diagnosis not present

## 2020-01-31 DIAGNOSIS — E118 Type 2 diabetes mellitus with unspecified complications: Secondary | ICD-10-CM | POA: Diagnosis not present

## 2020-01-31 DIAGNOSIS — E559 Vitamin D deficiency, unspecified: Secondary | ICD-10-CM | POA: Diagnosis not present

## 2020-01-31 DIAGNOSIS — Z794 Long term (current) use of insulin: Secondary | ICD-10-CM | POA: Diagnosis not present

## 2020-01-31 DIAGNOSIS — E1165 Type 2 diabetes mellitus with hyperglycemia: Secondary | ICD-10-CM | POA: Diagnosis not present

## 2020-03-09 ENCOUNTER — Encounter: Payer: Self-pay | Admitting: Family Medicine

## 2020-03-12 ENCOUNTER — Other Ambulatory Visit: Payer: Self-pay

## 2020-03-12 NOTE — Telephone Encounter (Signed)
Please order his medication as she requests for bid.  3 month supply and 3 refill.  Also, please respond to her and ask her to send on his mychart account in the future so the documentation will be right. And then copy a note on his chart so we know why we increased the dose. Thanks!

## 2020-03-15 ENCOUNTER — Other Ambulatory Visit: Payer: Medicare Other

## 2020-03-21 ENCOUNTER — Telehealth: Payer: Self-pay | Admitting: Family Medicine

## 2020-03-21 NOTE — Telephone Encounter (Signed)
Left message for patient to call back and schedule Medicare Annual Wellness Visit (AWV) either virtually/audio only OR in office. Whatever the patients preference is.  No hx PER [place]; please schedule at anytime with [facility]-Nurse Health Advisor at [facility].

## 2020-03-25 ENCOUNTER — Ambulatory Visit (INDEPENDENT_AMBULATORY_CARE_PROVIDER_SITE_OTHER): Payer: Medicare Other

## 2020-03-25 ENCOUNTER — Other Ambulatory Visit: Payer: Self-pay

## 2020-03-25 DIAGNOSIS — Z0000 Encounter for general adult medical examination without abnormal findings: Secondary | ICD-10-CM | POA: Diagnosis not present

## 2020-03-25 NOTE — Progress Notes (Signed)
This visit is being conducted via phone call due to the COVID-19 pandemic. This patient has given me verbal consent via phone to conduct this visit, patient states they are participating from their home address. Some vital signs may be absent or patient reported.   Patient identification: identified by name, DOB, and current address.  Location provider: [facility], Office Persons participating in the virtual visit: Denman George LPN,     Subjective:   Cassandra Johnston is a 69 y.o. female who presents for Medicare Annual (Subsequent) preventive examination.  Review of Systems:   Cardiac Risk Factors include: advanced age (>47men, >37 women);diabetes mellitus;dyslipidemia;hypertension    Objective:     Vitals: There were no vitals taken for this visit.  There is no height or weight on file to calculate BMI.  Advanced Directives 03/25/2020 07/17/2016 07/10/2016  Does Patient Have a Medical Advance Directive? Yes Yes Yes  Type of Advance Directive Living will;Healthcare Power of [place]  Does patient want to make changes to medical advance directive? No - Patient declined No - Patient declined No - Patient declined  Copy of [place] in Chart? No - copy requested No - copy requested No - copy requested    Tobacco Social History   Tobacco Use  Smoking Status Former Smoker  . Quit date: 12/01/2003  . Years since quitting: 16.3  Smokeless Tobacco Never Used  Tobacco Comment   Quit 10+ years ago     Counseling given: Not Answered Comment: Quit 10+ years ago   Clinical Intake:  Pre-visit preparation completed: Yes  Pain : No/denies pain  Diabetes: Yes CBG done?: No Did pt. bring in CBG monitor from home?: No  How often do you need to have someone help you when you read instructions, pamphlets, or other written materials from your doctor or pharmacy?: 1 - Never  Interpreter Needed?:  No  Information entered by :: Denman George LPN  Past Medical History:  Diagnosis Date  . Anxiety   . Arthritis   . Breast cancer ([place]) 2010   left  . Breast infection    left breast  . Chest pain 09/24/2015  . Complication of anesthesia    patient states she becomes combative with anesthesia with some surgeries  . Depression   . Diabetes mellitus   . Diverticulosis   . Esophageal stricture   . Essential hypertension 09/07/2019  . Family history of adverse reaction to anesthesia    father was also combative per patient report   . GERD (gastroesophageal reflux disease)   . H/O lymph node biopsy - abnl follicles /B cells [id number]   Dr. Jana Hakim: at risk for developing CLL.  Marland Kitchen Heart murmur   . Hypertension   . Malignant neoplasm of overlapping sites of left breast in female, estrogen receptor positive ([place]) 10/13/2019  . Osteoarthritis of left hip 07/17/2016  . Osteoporosis 09/07/2019  . Palpitations 09/24/2015  . Personal history of radiation therapy   . Pneumonia    hx of   . PONV (postoperative nausea and vomiting)   . Shortness of breath 09/24/2015   weight related per patient   . Tremor    Past Surgical History:  Procedure Laterality Date  . ABDOMINAL HYSTERECTOMY    . APPENDECTOMY    . BREAST LUMPECTOMY  2010  . CHOLECYSTECTOMY    . COLONOSCOPY    . CYSTECTOMY     on thyroid  . FRACTURE SURGERY  feet  . HAND SURGERY    . TONSILLECTOMY    . TOTAL HIP ARTHROPLASTY Left 07/17/2016   Procedure: LEFT TOTAL HIP ARTHROPLASTY ANTERIOR APPROACH;  Surgeon: Mcarthur Rossetti, MD;  Location: [facility];  Service: Orthopedics;  Laterality: Left;  . TUBAL LIGATION     Family History  Problem Relation Age of Onset  . Diabetes Mother   . Colon cancer Mother   . Atrial fibrillation Father   . CAD Father   . Pancreatic cancer Father    Social History   Socioeconomic History  . Marital status: Married    Spouse name: Not on file  . Number of children: 2  .  Years of education: Associates  . Highest education level: Not on file  Occupational History  . Occupation: Retired  Tobacco Use  . Smoking status: Former Smoker    Quit date: 12/01/2003    Years since quitting: 16.3  . Smokeless tobacco: Never Used  . Tobacco comment: Quit 10+ years ago  Substance and Sexual Activity  . Alcohol use: No    Comment: Quit 25+ years ago.  . Drug use: No  . Sexual activity: Not on file  Other Topics Concern  . Not on file  Social History Narrative   Epworth Sleepiness Scale = 9 (as of 09/24/2015)         Lives at home with husband and granddaughter.   Right-handed.   1 cup coffee per day.   Social Determinants of Health   Financial Resource Strain:   . Difficulty of Paying Living Expenses:   Food Insecurity:   . Worried About Charity fundraiser in the Last Year:   . Arboriculturist in the Last Year:   Transportation Needs:   . Film/video editor (Medical):   Marland Kitchen Lack of Transportation (Non-Medical):   Physical Activity:   . Days of Exercise per Week:   . Minutes of Exercise per Session:   Stress:   . Feeling of Stress :   Social Connections:   . Frequency of Communication with Friends and Family:   . Frequency of Social Gatherings with Friends and Family:   . Attends Religious Services:   . Active Member of Clubs or Organizations:   . Attends Archivist Meetings:   Marland Kitchen Marital Status:     Outpatient Encounter Medications as of 03/25/2020  Medication Sig  . benazepril-hydrochlorthiazide (LOTENSIN HCT) 10-12.5 MG tablet Take 1 tablet by mouth daily.  . busPIRone (BUSPAR) 7.5 MG tablet Take 1 tablet (7.5 mg total) by mouth 2 (two) times daily.  . DULoxetine (CYMBALTA) 60 MG capsule Take 1 capsule (60 mg total) by mouth daily.  . empagliflozin (JARDIANCE) 25 MG TABS tablet Take 1 tablet by mouth daily.  . Famotidine (PEPCID PO) Take 50 mg by mouth 2 (two) times daily.    . fluconazole (DIFLUCAN) 150 MG tablet Take one tablet  today; may repeat in 3 days if symptoms persist  . Insulin Glargine (BASAGLAR KWIKPEN) 100 UNIT/ML SOPN Inject 0.54 mLs (54 Units total) into the skin daily.  . metFORMIN (GLUCOPHAGE) 500 MG tablet Take 1,000 mg by mouth 2 (two) times daily.  Glory Rosebush ULTRA test strip TEST TID  . rosuvastatin (CRESTOR) 10 MG tablet Take 1 tablet (10 mg total) by mouth at bedtime.  Marland Kitchen tiZANidine (ZANAFLEX) 4 MG tablet Take 1 tablet (4 mg total) by mouth at bedtime.  . TRULICITY 1.5 0000000 SOPN Inject 1.5 mg into the skin  once a week. Tuesdays  . venlafaxine XR (EFFEXOR XR) 150 MG 24 hr capsule Take 1 capsule (150 mg total) by mouth daily with breakfast.   No facility-administered encounter medications on file as of 03/25/2020.    Activities of Daily Living In your present state of health, do you have any difficulty performing the following activities: 03/25/2020  Hearing? N  Vision? N  Difficulty concentrating or making decisions? N  Walking or climbing stairs? N  Dressing or bathing? N  Doing errands, shopping? N  Preparing Food and eating ? N  Using the Toilet? N  In the past six months, have you accidently leaked urine? N  Do you have problems with loss of bowel control? N  Managing your Medications? N  Managing your Finances? N  Housekeeping or managing your Housekeeping? N  Some recent data might be hidden    Patient Care Team: Leamon Arnt, MD as PCP - General (Family Medicine) Alphonsa Overall, MD as Consulting Physician (General Surgery) Magrinat, Virgie Dad, MD as Consulting Physician (Hematology and Oncology) Madelin Rear, MD as Consulting Physician (Endocrinology) Mcarthur Rossetti, MD as Consulting Physician (Orthopedic Surgery) Marcial Pacas, MD as Consulting Physician (Neurology) Regal, Tamala Fothergill, DPM as Consulting Physician (Podiatry) Skeet Latch, MD as Attending Physician (Cardiology) Marica Otter, OD as Consulting Physician (Optometry)    Assessment:   This is a  routine wellness examination for [place].  Exercise Activities and Dietary recommendations Current Exercise Habits: The patient does not participate in regular exercise at present  Goals   None     Fall Risk Fall Risk  03/25/2020  Falls in the past year? 0  Number falls in past yr: 0  Injury with Fall? 0  Follow up Falls evaluation completed;Education provided;Falls prevention discussed   Is the patient's home free of loose throw rugs in walkways, pet beds, electrical cords, etc?   yes      Grab bars in the bathroom? yes      Handrails on the stairs?   yes      Adequate lighting?   yes   Depression Screen PHQ 2/9 Scores 03/25/2020  PHQ - 2 Score 0     Cognitive Function- no cognitive concerns at this time    6CIT Screen 03/25/2020  What Year? 0 points  What month? 0 points  What time? 0 points  Count back from 20 0 points  Months in reverse 0 points  Repeat phrase 0 points  Total Score 0    Immunization History  Administered Date(s) Administered  . Fluad Quad(high Dose 65+) 09/07/2019  . PFIZER SARS-COV-2 Vaccination 12/11/2019, 01/03/2020  . Pneumococcal Polysaccharide-23 09/07/2019    Qualifies for Shingles Vaccine?Discussed and patient will check with pharmacy for coverage.  Patient education handout provided   Screening Tests Health Maintenance  Topic Date Due  . OPHTHALMOLOGY EXAM  Never done  . DEXA SCAN  03/07/2011  . FOOT EXAM  09/01/2014  . COLONOSCOPY  05/01/2019  . HEMOGLOBIN A1C  03/07/2020  . TETANUS/TDAP  03/25/2029 (Originally 04/27/1970)  . MAMMOGRAM  04/13/2020  . INFLUENZA VACCINE  06/30/2020  . PNA vac Low Risk Adult (2 of 2 - PCV13) 09/06/2020  . COVID-19 Vaccine  Completed  . Hepatitis C Screening  Completed    Cancer Screenings: Lung: Low Dose CT Chest recommended if Age 83-80 years, 30 pack-year currently smoking OR have quit w/in 15years. Patient does not qualify. Breast:  Up to date on Mammogram? Yes   Up to date  of Bone  Density/Dexa? No; will schedule with mammogram for 2021 Colorectal: patient states that she recall having colonoscopy in 2015; will request records     Plan:  I have personally reviewed and addressed the Medicare Annual Wellness questionnaire and have noted the following in the patient's chart:  A. Medical and social history B. Use of alcohol, tobacco or illicit drugs  C. Current medications and supplements D. Functional ability and status E.  Nutritional status F.  Physical activity G. Advance directives H. List of other physicians I.  Hospitalizations, surgeries, and ER visits in previous 12 months J.  [place] such as hearing and vision if needed, cognitive and depression L. Referrals, records requested, and appointments- diabetic eye result notes, last colonoscopy notes   In addition, I have reviewed and discussed with patient certain preventive protocols, quality metrics, and best practice recommendations. A written personalized care plan for preventive services as well as general preventive health recommendations were provided to patient.   Signed,  Denman George, LPN  Nurse Health Advisor   Nurse Notes: Patient with 6 month complaint of mouth pain.  States that she has redness in mouth and it feels raw with discomfort in eating acidic foods.  She has mentioned to endocrinologist with no advice received. She is scheduled for visit 04/09/20

## 2020-03-25 NOTE — Patient Instructions (Addendum)
Cassandra Johnston , Thank you for taking time to come for your Medicare Wellness Visit. I appreciate your ongoing commitment to your health goals. Please review the following plan we discussed and let me know if I can assist you in the future.   Screening recommendations/referrals: Colorectal Screening: we will check records to see if you are up to date Mammogram: up to date; last 04/14/19 (please see handout to schedule for this year)  Bone Density: recommended; please schedule with mammogram   Vision and Dental Exams: Recommended annual ophthalmology exams for early detection of glaucoma and other disorders of the eye Recommended annual dental exams for proper oral hygiene  Diabetic Exams: Diabetic Eye Exam: recommended yearly; we will request notes from Dr. Ammie Ferrier office  Diabetic Foot Exam: recommended yearly; at next office visit   Vaccinations: Influenza vaccine: completed 09/07/19 Pneumococcal vaccine: up to date; last 09/07/19 Tdap vaccine: recommended every 10 years; Please call your insurance company to determine your out of pocket expense. You also receive this vaccine at your local pharmacy or Health Dept. Shingles vaccine:  You may receive this vaccine at your local pharmacy. (see handout)  Covid vaccine:  Completed   Advanced directives: Please bring a copy of your POA (Power of Attorney) and/or Living Will to your next appointment.  Goals: Recommend to drink at least 6-8 8oz glasses of water per day and consume a balanced diet rich in fresh fruits and vegetables.   Next appointment: Please schedule your Annual Wellness Visit with your Nurse Health Advisor in one year.  Preventive Care 11 Years and Older, Female Preventive care refers to lifestyle choices and visits with your health care provider that can promote health and wellness. What does preventive care include?  A yearly physical exam. This is also called an annual well check.  Dental exams once or twice a  year.  Routine eye exams. Ask your health care provider how often you should have your eyes checked.  Personal lifestyle choices, including:  Daily care of your teeth and gums.  Regular physical activity.  Eating a healthy diet.  Avoiding tobacco and drug use.  Limiting alcohol use.  Practicing safe sex.  Taking low-dose aspirin every day if recommended by your health care provider.  Taking vitamin and mineral supplements as recommended by your health care provider. What happens during an annual well check? The services and screenings done by your health care provider during your annual well check will depend on your age, overall health, lifestyle risk factors, and family history of disease. Counseling  Your health care provider may ask you questions about your:  Alcohol use.  Tobacco use.  Drug use.  Emotional well-being.  Home and relationship well-being.  Sexual activity.  Eating habits.  History of falls.  Memory and ability to understand (cognition).  Work and work Statistician.  Reproductive health. Screening  You may have the following tests or measurements:  Height, weight, and BMI.  Blood pressure.  Lipid and cholesterol levels. These may be checked every 5 years, or more frequently if you are over 78 years old.  Skin check.  Lung cancer screening. You may have this screening every year starting at age 69 if you have a 30-pack-year history of smoking and currently smoke or have quit within the past 15 years.  Fecal occult blood test (FOBT) of the stool. You may have this test every year starting at age 81.  Flexible sigmoidoscopy or colonoscopy. You may have a sigmoidoscopy every 5 years or  a colonoscopy every 10 years starting at age 3.  Hepatitis C blood test.  Hepatitis B blood test.  Sexually transmitted disease (STD) testing.  Diabetes screening. This is done by checking your blood sugar (glucose) after you have not eaten for a while  (fasting). You may have this done every 1-3 years.  Bone density scan. This is done to screen for osteoporosis. You may have this done starting at age 67.  Mammogram. This may be done every 1-2 years. Talk to your health care provider about how often you should have regular mammograms. Talk with your health care provider about your test results, treatment options, and if necessary, the need for more tests. Vaccines  Your health care provider may recommend certain vaccines, such as:  Influenza vaccine. This is recommended every year.  Tetanus, diphtheria, and acellular pertussis (Tdap, Td) vaccine. You may need a Td booster every 10 years.  Zoster vaccine. You may need this after age 63.  Pneumococcal 13-valent conjugate (PCV13) vaccine. One dose is recommended after age 101.  Pneumococcal polysaccharide (PPSV23) vaccine. One dose is recommended after age 36. Talk to your health care provider about which screenings and vaccines you need and how often you need them. This information is not intended to replace advice given to you by your health care provider. Make sure you discuss any questions you have with your health care provider. Document Released: 12/13/2015 Document Revised: 08/05/2016 Document Reviewed: 09/17/2015 Elsevier Interactive Patient Education  2017 [place] Prevention in the Home Falls can cause injuries. They can happen to people of all ages. There are many things you can do to make your home safe and to help prevent falls. What can I do on the outside of my home?  Regularly fix the edges of walkways and driveways and fix any cracks.  Remove anything that might make you trip as you walk through a door, such as a raised step or threshold.  Trim any bushes or trees on the path to your home.  Use bright outdoor lighting.  Clear any walking paths of anything that might make someone trip, such as rocks or tools.  Regularly check to see if handrails are loose  or broken. Make sure that both sides of any steps have handrails.  Any raised decks and porches should have guardrails on the edges.  Have any leaves, snow, or ice cleared regularly.  Use sand or salt on walking paths during winter.  Clean up any spills in your garage right away. This includes oil or grease spills. What can I do in the bathroom?  Use night lights.  Install grab bars by the toilet and in the tub and shower. Do not use towel bars as grab bars.  Use non-skid mats or decals in the tub or shower.  If you need to sit down in the shower, use a plastic, non-slip stool.  Keep the floor dry. Clean up any water that spills on the floor as soon as it happens.  Remove soap buildup in the tub or shower regularly.  Attach bath mats securely with double-sided non-slip rug tape.  Do not have throw rugs and other things on the floor that can make you trip. What can I do in the bedroom?  Use night lights.  Make sure that you have a light by your bed that is easy to reach.  Do not use any sheets or blankets that are too big for your bed. They should not hang down onto  the floor.  Have a firm chair that has side arms. You can use this for support while you get dressed.  Do not have throw rugs and other things on the floor that can make you trip. What can I do in the kitchen?  Clean up any spills right away.  Avoid walking on wet floors.  Keep items that you use a lot in easy-to-reach places.  If you need to reach something above you, use a strong step stool that has a grab bar.  Keep electrical cords out of the way.  Do not use floor polish or wax that makes floors slippery. If you must use wax, use non-skid floor wax.  Do not have throw rugs and other things on the floor that can make you trip. What can I do with my stairs?  Do not leave any items on the stairs.  Make sure that there are handrails on both sides of the stairs and use them. Fix handrails that are  broken or loose. Make sure that handrails are as long as the stairways.  Check any carpeting to make sure that it is firmly attached to the stairs. Fix any carpet that is loose or worn.  Avoid having throw rugs at the top or bottom of the stairs. If you do have throw rugs, attach them to the floor with carpet tape.  Make sure that you have a light switch at the top of the stairs and the bottom of the stairs. If you do not have them, ask someone to add them for you. What else can I do to help prevent falls?  Wear shoes that:  Do not have high heels.  Have rubber bottoms.  Are comfortable and fit you well.  Are closed at the toe. Do not wear sandals.  If you use a stepladder:  Make sure that it is fully opened. Do not climb a closed stepladder.  Make sure that both sides of the stepladder are locked into place.  Ask someone to hold it for you, if possible.  Clearly mark and make sure that you can see:  Any grab bars or handrails.  First and last steps.  Where the edge of each step is.  Use tools that help you move around (mobility aids) if they are needed. These include:  Canes.  Walkers.  Scooters.  Crutches.  Turn on the lights when you go into a dark area. Replace any light bulbs as soon as they burn out.  Set up your furniture so you have a clear path. Avoid moving your furniture around.  If any of your floors are uneven, fix them.  If there are any pets around you, be aware of where they are.  Review your medicines with your doctor. Some medicines can make you feel dizzy. This can increase your chance of falling. Ask your doctor what other things that you can do to help prevent falls. This information is not intended to replace advice given to you by your health care provider. Make sure you discuss any questions you have with your health care provider. Document Released: 09/12/2009 Document Revised: 04/23/2016 Document Reviewed: 12/21/2014 Elsevier  Interactive Patient Education  2017 [employer].

## 2020-04-02 NOTE — Progress Notes (Signed)
I have reviewed the documented AWV and agree with the findings as documented by our nurse educator.  I was available for consultation during the visit. I will follow up with patient on any outstanding screens that are needed, results or new concerns that were found on this documented screening visit.   

## 2020-04-09 ENCOUNTER — Other Ambulatory Visit: Payer: Self-pay

## 2020-04-09 ENCOUNTER — Encounter: Payer: Self-pay | Admitting: Family Medicine

## 2020-04-09 ENCOUNTER — Ambulatory Visit (INDEPENDENT_AMBULATORY_CARE_PROVIDER_SITE_OTHER): Payer: Medicare Other | Admitting: Family Medicine

## 2020-04-09 VITALS — BP 128/80 | HR 65 | Temp 96.5°F | Resp 16 | Ht 64.0 in | Wt 184.2 lb

## 2020-04-09 DIAGNOSIS — I10 Essential (primary) hypertension: Secondary | ICD-10-CM | POA: Diagnosis not present

## 2020-04-09 DIAGNOSIS — F339 Major depressive disorder, recurrent, unspecified: Secondary | ICD-10-CM | POA: Diagnosis not present

## 2020-04-09 DIAGNOSIS — K219 Gastro-esophageal reflux disease without esophagitis: Secondary | ICD-10-CM | POA: Diagnosis not present

## 2020-04-09 DIAGNOSIS — E1169 Type 2 diabetes mellitus with other specified complication: Secondary | ICD-10-CM

## 2020-04-09 DIAGNOSIS — Z0000 Encounter for general adult medical examination without abnormal findings: Secondary | ICD-10-CM

## 2020-04-09 DIAGNOSIS — Z794 Long term (current) use of insulin: Secondary | ICD-10-CM

## 2020-04-09 DIAGNOSIS — M797 Fibromyalgia: Secondary | ICD-10-CM

## 2020-04-09 DIAGNOSIS — G63 Polyneuropathy in diseases classified elsewhere: Secondary | ICD-10-CM

## 2020-04-09 DIAGNOSIS — E782 Mixed hyperlipidemia: Secondary | ICD-10-CM

## 2020-04-09 DIAGNOSIS — E118 Type 2 diabetes mellitus with unspecified complications: Secondary | ICD-10-CM

## 2020-04-09 DIAGNOSIS — M810 Age-related osteoporosis without current pathological fracture: Secondary | ICD-10-CM

## 2020-04-09 DIAGNOSIS — F411 Generalized anxiety disorder: Secondary | ICD-10-CM

## 2020-04-09 LAB — CBC WITH DIFFERENTIAL/PLATELET
Basophils Absolute: 0.4 10*3/uL — ABNORMAL HIGH (ref 0.0–0.1)
Basophils Relative: 2.8 % (ref 0.0–3.0)
Eosinophils Absolute: 0.5 10*3/uL (ref 0.0–0.7)
Eosinophils Relative: 3.6 % (ref 0.0–5.0)
HCT: 43.3 % (ref 36.0–46.0)
Hemoglobin: 14.2 g/dL (ref 12.0–15.0)
Lymphocytes Relative: 35.8 % (ref 12.0–46.0)
Lymphs Abs: 5.3 10*3/uL — ABNORMAL HIGH (ref 0.7–4.0)
MCHC: 32.8 g/dL (ref 30.0–36.0)
MCV: 89.7 fl (ref 78.0–100.0)
Monocytes Absolute: 1.2 10*3/uL — ABNORMAL HIGH (ref 0.1–1.0)
Monocytes Relative: 8.4 % (ref 3.0–12.0)
Neutro Abs: 7.3 10*3/uL (ref 1.4–7.7)
Neutrophils Relative %: 49.4 % (ref 43.0–77.0)
Platelets: 193 10*3/uL (ref 150.0–400.0)
RBC: 4.83 Mil/uL (ref 3.87–5.11)
RDW: 14.4 % (ref 11.5–15.5)
WBC: 14.7 10*3/uL — ABNORMAL HIGH (ref 4.0–10.5)

## 2020-04-09 LAB — COMPREHENSIVE METABOLIC PANEL
ALT: 29 U/L (ref 0–35)
AST: 29 U/L (ref 0–37)
Albumin: 4.4 g/dL (ref 3.5–5.2)
Alkaline Phosphatase: 133 U/L — ABNORMAL HIGH (ref 39–117)
BUN: 15 mg/dL (ref 6–23)
CO2: 27 mEq/L (ref 19–32)
Calcium: 9.9 mg/dL (ref 8.4–10.5)
Chloride: 100 mEq/L (ref 96–112)
Creatinine, Ser: 0.84 mg/dL (ref 0.40–1.20)
GFR: 67.24 mL/min (ref >60.00)
Glucose, Bld: 162 mg/dL — ABNORMAL HIGH (ref 70–99)
Potassium: 5 mEq/L (ref 3.5–5.1)
Sodium: 140 mEq/L (ref 135–145)
Total Bilirubin: 0.6 mg/dL (ref 0.2–1.2)
Total Protein: 7.3 g/dL (ref 6.0–8.3)

## 2020-04-09 LAB — LIPID PANEL
Cholesterol: 150 mg/dL (ref 0–200)
HDL: 66.7 mg/dL (ref >39.00)
LDL Cholesterol: 62 mg/dL (ref 0–99)
NonHDL: 83
Total CHOL/HDL Ratio: 2
Triglycerides: 107 mg/dL (ref 0.0–149.0)
VLDL: 21.4 mg/dL (ref 0.0–40.0)

## 2020-04-09 LAB — TSH: TSH: 1.6 u[IU]/mL (ref 0.35–4.50)

## 2020-04-09 MED ORDER — BUSPIRONE HCL 7.5 MG PO TABS: 7.5000 mg | ORAL_TABLET | Freq: Two times a day (BID) | ORAL | 5 refills | Status: DC

## 2020-04-09 NOTE — Progress Notes (Signed)
Subjective  Chief Complaint  Patient presents with  . Annual Exam  . Anxiety    feels that this is well controlled  . Depression    states that she feels she is bipolar  . Diabetes  . Hyperlipidemia  . Hypertension    HPI: Cassandra Johnston is a 69 y.o. female who presents to [facility] [place] today for a Female Wellness Visit. She also has the concerns and/or needs as listed above in the chief complaint. These will be addressed in addition to the Health Maintenance Visit.   Wellness Visit: annual visit with health maintenance review and exam without Pap   HM: due mammo and dexa. imms up to date. Due eye exam - missed her last appt and will reschedule. H/o osteoporosis w/o treatment.  Chronic disease f/u and/or acute problem visit: (deemed necessary to be done in addition to the wellness visit):  DM: per endocrine. Says improving but not yet there. No more yeast infections. Taking meds as prescribed.   Depression and anxiety: reports mood will cycle rapidly; denies manic sxs. On meds and anxiety is much improved on buspar. No longer with sob. She questions if she has bipolar d/o. Never has seen psychiatry in the past.   Fibromyalgia is stable. Some back pain. No recent bad flares  HTN: has been controlled on meds including arb. No cp or le edema or palpitations.  HLD on statin. Due for recheck. Has been controlled.   Assessment  1. Annual physical exam   2. Type 2 diabetes mellitus with complication, with long-term current use of insulin ([place])   3. Major depression, recurrent, chronic ([place])   4. Osteoporosis without current pathological fracture, unspecified osteoporosis type   5. Gastroesophageal reflux disease, unspecified whether esophagitis present   6. Polyneuropathy associated with underlying disease ([place])   7. Fibromyalgia   8. GAD (generalized anxiety disorder)   9. Essential hypertension   10. Combined hyperlipidemia associated with type  2 diabetes mellitus [facility])      Plan  Female Wellness Visit:  Age appropriate Health Maintenance and Prevention measures were discussed with patient. Included topics are cancer screening recommendations, ways to keep healthy (see AVS) including dietary and exercise recommendations, regular eye and dental care, use of seat belts, and avoidance of moderate alcohol use and tobacco use. Due mammo. Pt to schedule.  BMI: discussed patient's BMI and encouraged positive lifestyle modifications to help get to or maintain a target BMI.  HM needs and immunizations were addressed and ordered. See below for orders. See HM and immunization section for updates. UTD  Routine labs and screening tests ordered including cmp, cbc and lipids where appropriate.  Discussed recommendations regarding Vit D and calcium supplementation (see AVS)  Chronic disease management visit and/or acute problem visit:  DM: uncontrolled but improving. To see endocrine  HTN: controlled. Check renal function and electrolytes.  HLD recheck today on statin  Fibromyalgia is stable.  Mood: unlikley to be bipolar but she could be seen by psych if she'd like. Continue current mood meds as she feels they help her and her mood overall is stable.   Osteoporosis: recheck overdue dexa and discussed starting fosamax if indicated. Cont vit d and calicum  Discussed skin: has unhealed picker's nodules and picks at scab on toenail. No infection at this time.   Follow up: 6 months for recheck htn and recheck.   Orders Placed This Encounter  Procedures  . MM DIGITAL SCREENING BILATERAL  .  DG Bone Density  . CBC with Differential/Platelet  . Comprehensive metabolic panel  . Lipid panel  . TSH   Meds ordered this encounter  Medications  . busPIRone (BUSPAR) 7.5 MG tablet    Sig: Take 1 tablet (7.5 mg total) by mouth 2 (two) times daily.    Dispense:  60 tablet    Refill:  5      Lifestyle: Body mass index is 31.62 kg/m. Wt  Readings from Last 3 Encounters:  04/09/20 184 lb 3.2 oz (83.6 kg)  11/03/19 182 lb 8 oz (82.8 kg)  10/13/19 187 lb 9.6 oz (85.1 kg)    Patient Active Problem List   Diagnosis Date Noted  . Osteoporosis 09/07/2019    Priority: High  . GAD (generalized anxiety disorder) 09/07/2019    Priority: High  . Essential hypertension 09/07/2019    Priority: High  . H/O lymph node biopsy - abnl follicles /B cells [id number]    Priority: High    Dr. Jana Hakim: at risk for developing CLL.   Marland Kitchen Peripheral neuropathy 02/03/2017    Priority: High  . Combined hyperlipidemia associated with type 2 diabetes mellitus ([place]) 09/25/2011    Priority: High  . Type 2 diabetes mellitus with complication, with long-term current use of insulin ([place]) 04/24/2009    Priority: High  . Fibromyalgia 11/30/2006    Priority: High  . Major depression, recurrent, chronic ([place]) 11/30/1978    Priority: High  . 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 Jana Hakim 10/2019; recheck IF > 10,000 consistently.    . Bilateral primary osteoarthritis of knee 09/07/2019    Priority: Medium  . Essential tremor 02/03/2017    Priority: Medium  . History of breast cancer 03/23/2012    Priority: Medium    > 10 year, routine f/u.    Marland Kitchen GERD 04/24/2009    Priority: Medium  . Irritable bowel syndrome with diarrhea 04/24/2009    Priority: Medium  . Colon polyps 04/24/2009    Priority: Medium  . Status post left hip replacement 07/17/2016    Priority: Low  . History of esophageal stricture 04/24/2009    Priority: Low  . Leukocytosis 09/23/2019  . SOB (shortness of breath) 09/24/2015    Ongoing for years; Cardiology evaluation with Dr. Oval Linsey: nonspecific changes on EKG; echocardiogram: mild diastolic dysfunction, no wall motion abnormalities; negative myocardial perfusion stress test. 2016    Health Maintenance  Topic Date Due  . OPHTHALMOLOGY EXAM  Never done  . DEXA  SCAN  03/07/2011  . COLONOSCOPY  05/01/2019  . HEMOGLOBIN A1C  03/07/2020  . TETANUS/TDAP  03/25/2029 (Originally 04/27/1970)  . MAMMOGRAM  04/13/2020  . INFLUENZA VACCINE  06/30/2020  . PNA vac Low Risk Adult (2 of 2 - PCV13) 09/06/2020  . FOOT EXAM  04/09/2021  . COVID-19 Vaccine  Completed  . Hepatitis C Screening  Completed   Immunization History  Administered Date(s) Administered  . Fluad Quad(high Dose 65+) 09/07/2019  . PFIZER SARS-COV-2 Vaccination 12/11/2019, 01/03/2020  . Pneumococcal Polysaccharide-23 09/07/2019   We updated and reviewed the patient's past history in detail and it is documented below. Allergies: Patient is allergic to naproxen and sulfa antibiotics. Past Medical History Patient  has a past medical history of Anxiety, Arthritis, Breast cancer ([place]) (2010), Breast infection, Chest pain ([id number]), Complication of anesthesia, Depression, Diabetes mellitus, Diverticulosis, Esophageal stricture, Essential hypertension (09/07/2019), Family history of adverse reaction to anesthesia, GERD (gastroesophageal reflux  disease), H/O lymph node biopsy - abnl follicles /B cells (99991111), Heart murmur, History of left breast cancer (01/04/2014), Hypertension, Malignant neoplasm of overlapping sites of left breast in female, estrogen receptor positive ([place]) (10/13/2019), Osteoarthritis of left hip (07/17/2016), Osteoporosis (09/07/2019), Palpitations (09/24/2015), Personal history of radiation therapy, Pneumonia, PONV (postoperative nausea and vomiting), Shortness of breath (09/24/2015), and Tremor. Past Surgical History Patient  has a past surgical history that includes Breast lumpectomy (2010); Abdominal hysterectomy; Cholecystectomy; Appendectomy; Tonsillectomy; Tubal ligation; Fracture surgery; Colonoscopy; Hand surgery; Cystectomy; and Total hip arthroplasty (Left, 07/17/2016). Family History: Patient family history includes Atrial fibrillation in her father; CAD in her father;  Colon cancer in her mother; Diabetes in her mother; Pancreatic cancer in her father. Social History:  Patient  reports that she quit smoking about 16 years ago. She has never used smokeless tobacco. She reports that she does not drink alcohol or use drugs.  Review of Systems: Constitutional: negative for fever or malaise Ophthalmic: negative for photophobia, double vision or loss of vision Cardiovascular: negative for chest pain, dyspnea on exertion, or new LE swelling Respiratory: negative for SOB or persistent cough Gastrointestinal: negative for abdominal pain, change in bowel habits or melena Genitourinary: negative for dysuria or gross hematuria, no abnormal uterine bleeding or disharge Musculoskeletal: negative for new gait disturbance or muscular weakness Integumentary: negative for new or persistent rashes, no breast lumps Neurological: negative for TIA or stroke symptoms Psychiatric: negative for SI or delusions Allergic/Immunologic: negative for hives  Patient Care Team    Relationship Specialty Notifications Start End  Leamon Arnt, MD PCP - General Family Medicine  09/07/19   Alphonsa Overall, MD Consulting Physician General Surgery  04/05/12   Magrinat, Virgie Dad, MD Consulting Physician Hematology and Oncology  04/05/12   Madelin Rear, MD Consulting Physician Endocrinology  09/07/19   Mcarthur Rossetti, MD Consulting Physician Orthopedic Surgery  09/07/19   Marcial Pacas, MD Consulting Physician Neurology  09/07/19   Wallene Huh, [place] Consulting Physician Podiatry  09/07/19   Skeet Latch, MD Attending Physician Cardiology  09/07/19   Marica Otter, OD Consulting Physician Optometry  03/25/20     Objective  Vitals: BP 128/80   Pulse 65   Temp (!) 96.5 F (35.8 C) (Temporal)   Resp 16   Ht 5\' 4"  (1.626 m)   Wt 184 lb 3.2 oz (83.6 kg)   SpO2 98%   BMI 31.62 kg/m  General:  Well developed, well nourished, no acute distress  Psych:  Alert and orientedx3,normal  mood and affect HEENT:  Normocephalic, atraumatic, non-icteric sclera,  supple neck without adenopathy, mass or thyromegaly Cardiovascular:  Normal S1, S2, RRR without gallop, rub or murmur Respiratory:  Good breath sounds bilaterally, CTAB with normal respiratory effort Gastrointestinal: normal bowel sounds, soft, non-tender, no noted masses. No HSM MSK: no deformities, contusions. Joints are without erythema or swelling.  Skin:  Warm, no rashes or suspicious lesions noted, has hyperpigmented area on upper right arm with central scabbed lesion. Neurologic:    Mental status is normal. CN 2-11 are normal. Gross motor and sensory exams are normal. Normal gait. No tremor Breast Exam: No mass, skin retraction or nipple discharge is appreciated in either breast. Some palpable scarring at inferior breast. No axillary adenopathy. Fibrocystic changes are not noted Diabetic Foot Exam: Appearance - no lesions, ulcers or calluses Skin - no sigificant pallor or erythema, scab at medial left great toe Monofilament testing - sensitive bilaterally in following locations:  Right -  Great toe, medial, central, lateral ball and posterior foot intact  Left - Great toe, medial, central, lateral ball and posterior foot intact Pulses - +2 distally bilaterally   Commons side effects, risks, benefits, and alternatives for medications and treatment plan prescribed today were discussed, and the patient expressed understanding of the given instructions. Patient is instructed to call or message via MyChart if he/she has any questions or concerns regarding our treatment plan. No barriers to understanding were identified. We discussed Red Flag symptoms and signs in detail. Patient expressed understanding regarding what to do in case of urgent or emergency type symptoms.   Medication list was reconciled, printed and provided to the patient in AVS. Patient instructions and summary information was reviewed with the patient as  documented in the AVS. This note was prepared with assistance of Dragon voice recognition software. Occasional wrong-word or sound-a-like substitutions may have occurred due to the inherent limitations of voice recognition software  This visit occurred during the SARS-CoV-2 public health emergency.  Safety protocols were in place, including screening questions prior to the visit, additional usage of staff PPE, and extensive cleaning of exam room while observing appropriate contact time as indicated for disinfecting solutions.

## 2020-04-09 NOTE — Patient Instructions (Signed)
Please return in 6 months for hypertension follow up and recheck.  I will release your lab results to you on your MyChart account with further instructions. Please reply with any questions.   I'm glad you are doing well.  Your diabetes is improving but still not yet controlled. Please see your endocrinologist and continue to work on eating a diabetic diet.   I have ordered a mammogram and/or bone density for you as we discussed today: _0   Mammogram  _1   Bone Density  Please call the office checked below to schedule your appointment: Your appointment will at the following location  _2   [facility]      [place], [place]         _3   [facility]  [place], [place]  Make sure to wear two peace clothing  No lotions powders or deodorants the day of the appointment Make sure to bring picture ID and insurance card.  Bring list of medications you are currently taking including any supplements.   If you have any questions or concerns, please don't hesitate to send me a message via MyChart or call the office at [phone]. Thank you for visiting with [place] today! It's our pleasure caring for you.  Here is information on ostopeorosis: we will start medications if your bone density shows that you need it for low bone density which increases your risk of fracture.  Osteoporosis  Osteoporosis is thinning and loss of density in your bones. Osteoporosis makes bones more brittle and fragile and more likely to break (fracture). Over time, osteoporosis can cause your bones to become so weak that they fracture after a minor fall. Bones in the hip, wrist, and spine are most likely to fracture due to osteoporosis. What are the causes? The exact cause of this condition is not known. What increases the risk? You may be at greater risk for osteoporosis if you:  Have a family history of the  condition.  Have poor nutrition.  Use steroid medicines, such as prednisone.  Are female.  Are age 66 or older.  Smoke or have a history of smoking.  Are not physically active (are sedentary).  Are white (Caucasian) or of Asian descent.  Have a small body frame.  Take certain medicines, such as antiseizure medicines. What are the signs or symptoms? A fracture might be the first sign of osteoporosis, especially if the fracture results from a fall or injury that usually would not cause a bone to break. Other signs and symptoms include:  Pain in the neck or low back.  Stooped posture.  Loss of height. How is this diagnosed? This condition may be diagnosed based on:  Your medical history.  A physical exam.  A bone mineral density test, also called a DXA or DEXA test (dual-energy X-ray absorptiometry test). This test uses X-rays to measure the amount of minerals in your bones. How is this treated? The goal of treatment is to strengthen your bones and lower your risk for a fracture. Treatment may involve:  Making lifestyle changes, such as: ? Including foods with more calcium and vitamin D in your diet. ? Doing weight-bearing and muscle-strengthening exercises. ? Stopping tobacco use. ? Limiting alcohol intake.  Taking medicine to slow the process of bone loss or to increase bone density.  Taking daily supplements of calcium  and vitamin D.  Taking hormone replacement medicines, such as estrogen for women and testosterone for men.  Monitoring your levels of calcium and vitamin D. Follow these instructions at home:  Activity  Exercise as told by your health care provider. Ask your health care provider what exercises and activities are safe for you. You should do: ? Exercises that make you work against gravity (weight-bearing exercises), such as tai chi, yoga, or walking. ? Exercises to strengthen muscles, such as lifting weights. Lifestyle  Limit alcohol intake to  no more than 1 drink a day for nonpregnant women and 2 drinks a day for men. One drink equals 12 oz of beer, 5 oz of wine, or 1 oz of hard liquor.  Do not use any products that contain nicotine or tobacco, such as cigarettes and e-cigarettes. If you need help quitting, ask your health care provider. Preventing falls  Use devices to help you move around (mobility aids) as needed, such as canes, walkers, scooters, or crutches.  Keep rooms well-lit and clutter-free.  Remove tripping hazards from walkways, including cords and throw rugs.  Install grab bars in bathrooms and safety rails on stairs.  Use rubber mats in the bathroom and other areas that are often wet or slippery.  Wear closed-toe shoes that fit well and support your feet. Wear shoes that have rubber soles or low heels.  Review your medicines with your health care provider. Some medicines can cause dizziness or changes in blood pressure, which can increase your risk of falling. General instructions  Include calcium and vitamin D in your diet. Calcium is important for bone health, and vitamin D helps your body to absorb calcium. Good sources of calcium and vitamin D include: ? Certain fatty fish, such as salmon and tuna. ? Products that have calcium and vitamin D added to them (fortified products), such as fortified cereals. ? Egg yolks. ? Cheese. ? Liver.  Take over-the-counter and prescription medicines only as told by your health care provider.  Keep all follow-up visits as told by your health care provider. This is important. Contact a health care provider if:  You have never been screened for osteoporosis and you are: ? A woman who is age 46 or older. ? A man who is age 47 or older. Get help right away if:  You fall or injure yourself. Summary  Osteoporosis is thinning and loss of density in your bones. This makes bones more brittle and fragile and more likely to break (fracture),even with minor falls.  The goal  of treatment is to strengthen your bones and reduce your risk for a fracture.  Include calcium and vitamin D in your diet. Calcium is important for bone health, and vitamin D helps your body to absorb calcium.  Talk with your health care provider about screening for osteoporosis if you are a woman who is age 47 or older, or a man who is age 22 or older. This information is not intended to replace advice given to you by your health care provider. Make sure you discuss any questions you have with your health care provider. Document Revised: 10/29/2017 Document Reviewed: 09/10/2017 Elsevier Patient Education  2020 [employer].

## 2020-05-13 ENCOUNTER — Encounter: Payer: Self-pay | Admitting: Family Medicine

## 2020-05-13 ENCOUNTER — Other Ambulatory Visit: Payer: Self-pay

## 2020-05-13 DIAGNOSIS — Z1231 Encounter for screening mammogram for malignant neoplasm of breast: Secondary | ICD-10-CM

## 2020-05-17 ENCOUNTER — Other Ambulatory Visit: Payer: Self-pay | Admitting: Family Medicine

## 2020-05-17 DIAGNOSIS — M810 Age-related osteoporosis without current pathological fracture: Secondary | ICD-10-CM

## 2020-06-20 DIAGNOSIS — E119 Type 2 diabetes mellitus without complications: Secondary | ICD-10-CM | POA: Diagnosis not present

## 2020-07-02 DIAGNOSIS — K130 Diseases of lips: Secondary | ICD-10-CM | POA: Diagnosis not present

## 2020-07-02 DIAGNOSIS — L280 Lichen simplex chronicus: Secondary | ICD-10-CM | POA: Diagnosis not present

## 2020-07-02 DIAGNOSIS — L603 Nail dystrophy: Secondary | ICD-10-CM | POA: Diagnosis not present

## 2020-08-07 ENCOUNTER — Ambulatory Visit: Payer: Medicare Other

## 2020-08-07 ENCOUNTER — Other Ambulatory Visit: Payer: Self-pay | Admitting: Family Medicine

## 2020-08-07 ENCOUNTER — Other Ambulatory Visit: Payer: Medicare Other

## 2020-08-07 DIAGNOSIS — Z0000 Encounter for general adult medical examination without abnormal findings: Secondary | ICD-10-CM

## 2020-10-10 ENCOUNTER — Ambulatory Visit: Payer: Medicare Other | Admitting: Family Medicine

## 2020-10-31 ENCOUNTER — Other Ambulatory Visit: Payer: Self-pay | Admitting: Family Medicine

## 2020-11-03 ENCOUNTER — Telehealth: Payer: Medicare Other | Admitting: Family

## 2020-11-03 DIAGNOSIS — J209 Acute bronchitis, unspecified: Secondary | ICD-10-CM | POA: Diagnosis not present

## 2020-11-03 MED ORDER — PREDNISONE 10 MG (21) PO TBPK: ORAL_TABLET | ORAL | 0 refills | Status: DC

## 2020-11-03 MED ORDER — PROMETHAZINE-DM 6.25-15 MG/5ML PO SYRP: 5.0000 mL | ORAL_SOLUTION | Freq: Three times a day (TID) | ORAL | 0 refills | Status: DC | PRN

## 2020-11-03 NOTE — Progress Notes (Signed)
We are sorry that you are not feeling well.  Here is how we plan to help!  Based on your presentation I believe you most likely have A cough due to a virus.  This is called viral bronchitis and is best treated by rest, plenty of fluids and control of the cough.  You may use Ibuprofen or Tylenol as directed to help your symptoms.     In addition you may use A non-prescription cough medication called Robitussin DAC. Take 2 teaspoons every 8 hours or Delsym: take 2 teaspoons every 12 hours. and A non-prescription cough medication called Mucinex DM: take 2 tablets every 12 hours.   I have prescribed promethazine- DM cough syrup.   Prednisone 10 mg daily for 6 days (see taper instructions below)  Directions for 6 day taper: Day 1: 2 tablets before breakfast, 1 after both lunch & dinner and 2 at bedtime Day 2: 1 tab before breakfast, 1 after both lunch & dinner and 2 at bedtime Day 3: 1 tab at each meal & 1 at bedtime Day 4: 1 tab at breakfast, 1 at lunch, 1 at bedtime Day 5: 1 tab at breakfast & 1 tab at bedtime Day 6: 1 tab at breakfast   From your responses in the eVisit questionnaire you describe inflammation in the upper respiratory tract which is causing a significant cough.  This is commonly called Bronchitis and has four common causes:    Allergies  Viral Infections  Acid Reflux  Bacterial Infection Allergies, viruses and acid reflux are treated by controlling symptoms or eliminating the cause. An example might be a cough caused by taking certain blood pressure medications. You stop the cough by changing the medication. Another example might be a cough caused by acid reflux. Controlling the reflux helps control the cough.  USE OF BRONCHODILATOR ("RESCUE") INHALERS: There is a risk from using your bronchodilator too frequently.  The risk is that over-reliance on a medication which only relaxes the muscles surrounding the breathing tubes can reduce the effectiveness of medications  prescribed to reduce swelling and congestion of the tubes themselves.  Although you feel brief relief from the bronchodilator inhaler, your asthma may actually be worsening with the tubes becoming more swollen and filled with mucus.  This can delay other crucial treatments, such as oral steroid medications. If you need to use a bronchodilator inhaler daily, several times per day, you should discuss this with your provider.  There are probably better treatments that could be used to keep your asthma under control.     HOME CARE . Only take medications as instructed by your medical team. . Complete the entire course of an antibiotic. . Drink plenty of fluids and get plenty of rest. . Avoid close contacts especially the very young and the elderly . Cover your mouth if you cough or cough into your sleeve. . Always remember to wash your hands . A steam or ultrasonic humidifier can help congestion.   GET HELP RIGHT AWAY IF: . You develop worsening fever. . You become short of breath . You cough up blood. . Your symptoms persist after you have completed your treatment plan MAKE SURE YOU   Understand these instructions.  Will watch your condition.  Will get help right away if you are not doing well or get worse.  Your e-visit answers were reviewed by a board certified advanced clinical practitioner to complete your personal care plan.  Depending on the condition, your plan could have included both  over the counter or prescription medications. If there is a problem please reply  once you have received a response from your provider. Your safety is important to [place].  If you have drug allergies check your prescription carefully.    You can use MyChart to ask questions about today's visit, request a non-urgent call back, or ask for a work or school excuse for 24 hours related to this e-Visit. If it has been greater than 24 hours you will need to follow up with your provider, or enter a new e-Visit to  address those concerns. You will get an e-mail in the next two days asking about your experience.  I hope that your e-visit has been valuable and will speed your recovery. Thank you for using e-visits.  Approximately 5 minutes was spent documenting and reviewing patient's chart.

## 2020-11-05 ENCOUNTER — Encounter: Payer: Self-pay | Admitting: Family Medicine

## 2020-11-06 ENCOUNTER — Ambulatory Visit (INDEPENDENT_AMBULATORY_CARE_PROVIDER_SITE_OTHER): Payer: Medicare Other | Admitting: Family Medicine

## 2020-11-06 ENCOUNTER — Other Ambulatory Visit: Payer: Self-pay

## 2020-11-06 ENCOUNTER — Encounter: Payer: Self-pay | Admitting: Family Medicine

## 2020-11-06 VITALS — BP 132/74 | HR 71 | Temp 97.5°F | Resp 16 | Ht 64.0 in | Wt 170.4 lb

## 2020-11-06 DIAGNOSIS — Z794 Long term (current) use of insulin: Secondary | ICD-10-CM

## 2020-11-06 DIAGNOSIS — M810 Age-related osteoporosis without current pathological fracture: Secondary | ICD-10-CM | POA: Diagnosis not present

## 2020-11-06 DIAGNOSIS — Z86010 Personal history of colonic polyps: Secondary | ICD-10-CM

## 2020-11-06 DIAGNOSIS — E118 Type 2 diabetes mellitus with unspecified complications: Secondary | ICD-10-CM | POA: Diagnosis not present

## 2020-11-06 DIAGNOSIS — Z23 Encounter for immunization: Secondary | ICD-10-CM | POA: Diagnosis not present

## 2020-11-06 DIAGNOSIS — I10 Essential (primary) hypertension: Secondary | ICD-10-CM

## 2020-11-06 DIAGNOSIS — F411 Generalized anxiety disorder: Secondary | ICD-10-CM

## 2020-11-06 DIAGNOSIS — J208 Acute bronchitis due to other specified organisms: Secondary | ICD-10-CM

## 2020-11-06 LAB — POCT GLYCOSYLATED HEMOGLOBIN (HGB A1C): Hemoglobin A1C: 6.8 % — AB (ref 4.0–5.6)

## 2020-11-06 MED ORDER — SHINGRIX 50 MCG/0.5ML IM SUSR: 0.5000 mL | Freq: Once | INTRAMUSCULAR | 0 refills | Status: AC

## 2020-11-06 MED ORDER — GUAIFENESIN-CODEINE 100-10 MG/5ML PO SOLN: 5.0000 mL | Freq: Four times a day (QID) | ORAL | 0 refills | Status: DC | PRN

## 2020-11-06 MED ORDER — TIZANIDINE HCL 4 MG PO TABS
4.0000 mg | ORAL_TABLET | Freq: Every day | ORAL | 3 refills | Status: DC
Start: 2020-11-06 — End: 2021-07-21

## 2020-11-06 NOTE — Patient Instructions (Signed)
Please return in 6 months for your annual complete physical; please come fasting.  Glad you are doing well. Your diabetes is controlled! Today you were given your Prevnar vaccination. This helps protect you from getting pneumonia.  Please take the prescription for Shingrix to the pharmacy so they may administer the vaccinations. Your insurance will then cover the injections.   If you have any questions or concerns, please don't hesitate to send me a message via MyChart or call the office at [phone]. Thank you for visiting with [place] today! It's our pleasure caring for you.

## 2020-11-06 NOTE — Progress Notes (Signed)
Subjective  CC:  Chief Complaint  Patient presents with  . Follow-up    HPI: Cassandra Johnston is a 69 y.o. female who presents to the office today for follow up of diabetes and problems listed above in the chief complaint.   Diabetes follow up: Her diabetic control is reported as Improved.  She has been working with endocrinologist and medications have been adjusted.  Diet is improved.  Weight loss has been achieved.  No symptoms of hypoglycemia. She denies exertional CP or SOB or symptomatic hypoglycemia. She denies foot sores or paresthesias.  Reports normal eye exam this year by Dr. Sabra Heck.  Hypertension: Has been well controlled.  No symptoms of coronary disease or CHF.  Tolerates medicines.  Compliant.  Mood is well controlled.  She did stop her buspirone because she thought she did not need it.  Panic attacks resurfaced.  She has since restarted and feels well controlled.  Has bone density screening scheduled for the end of this month.  Needs follow-up for osteoporosis.  She does take calcium vitamin D.  She began treatment.  Reports recent bronchitis.  Started about a week ago.  No productive cough.  No fevers or chills.  She is Covid negative.  Using over-the-counter cough syrup without relief.  Cough keeps her awake at night.  Has used Robitussin codeine in the past successfully.  Requests refill.  No chest pain or shortness of breath.  Overdue for colonoscopy screening.  Immunizations due: Prevnar Wt Readings from Last 3 Encounters:  11/06/20 170 lb 6.4 oz (77.3 kg)  04/09/20 184 lb 3.2 oz (83.6 kg)  11/03/19 182 lb 8 oz (82.8 kg)    BP Readings from Last 3 Encounters:  11/06/20 132/74  04/09/20 128/80  11/03/19 119/79    Assessment  1. Essential hypertension   2. Type 2 diabetes mellitus with complication, with long-term current use of insulin ([place])   3. GAD (generalized anxiety disorder)   4. Osteoporosis without current pathological fracture, unspecified  osteoporosis type   5. Viral bronchitis   6. Polyp of colon, unspecified part of colon, unspecified type   7. History of colon polyps   8. Need for vaccination with 13-polyvalent pneumococcal conjugate vaccine      Plan   Diabetes is currently very well controlled.  Continue current management per endocrinology.  Praised.  Hypertension is well controlled.  Continue current patient.  Anxiety this medical condition is well controlled. There are no signs of complications, medication side effects, or red flags. Patient is instructed to continue the current treatment plan without change in therapies or medications.  Refer to GI for colonoscopy, surveillance.  She does have history of colon polyps.  Last colonoscopy was normal.  Viral bronchitis: Improving.  Robitussin with codeine for nighttime use.  Follow-up as needed.  Health maintenance: Prevnar vaccination given today.  Bone density scheduled and will review once results are back.  Follow up: No follow-ups on file.. Orders Placed This Encounter  Procedures  . Pneumococcal conjugate vaccine 13-valent  . Ambulatory referral to Gastroenterology  . POCT HgB A1C   Meds ordered this encounter  Medications  . guaiFENesin-codeine 100-10 MG/5ML syrup    Sig: Take 5 mLs by mouth every 6 (six) hours as needed for cough.    Dispense:  120 mL    Refill:  0  . Zoster Vaccine Adjuvanted [facility]) injection    Sig: Inject 0.5 mLs into the muscle once for 1 dose. Please give 2nd dose 2-6  months after first dose    Dispense:  2 each    Refill:  0  . tiZANidine (ZANAFLEX) 4 MG tablet    Sig: Take 1 tablet (4 mg total) by mouth at bedtime.    Dispense:  90 tablet    Refill:  3      Immunization History  Administered Date(s) Administered  . Fluad Quad(high Dose 65+) 09/07/2019  . Influenza-Unspecified 07/29/2020  . PFIZER SARS-COV-2 Vaccination 12/11/2019, 01/03/2020  . Pneumococcal Conjugate-13 11/06/2020  . Pneumococcal  Polysaccharide-23 09/07/2019    Diabetes Related Lab Review: Lab Results  Component Value Date   HGBA1C 6.8 (A) 11/06/2020   HGBA1C 9.1 (H) 09/07/2019   HGBA1C 9.1 09/05/2018    No results found for: Derl Barrow Lab Results  Component Value Date   CREATININE 0.84 04/09/2020   BUN 15 04/09/2020   NA 140 04/09/2020   K 5.0 04/09/2020   CL 100 04/09/2020   CO2 27 04/09/2020   Lab Results  Component Value Date   CHOL 150 04/09/2020   CHOL 152 09/07/2019   Lab Results  Component Value Date   HDL 66.70 04/09/2020   HDL 69.90 09/07/2019   Lab Results  Component Value Date   LDLCALC 62 04/09/2020   LDLCALC 53 09/07/2019   Lab Results  Component Value Date   TRIG 107.0 04/09/2020   TRIG 145.0 09/07/2019   Lab Results  Component Value Date   CHOLHDL 2 04/09/2020   CHOLHDL 2 09/07/2019   No results found for: LDLDIRECT The 10-year ASCVD risk score Mikey Bussing DC Jr., et al., 2013) is: 19.2%   Values used to calculate the score:     Age: 35 years     Sex: Female     Is Non-Hispanic African American: No     Diabetic: Yes     Tobacco smoker: No     Systolic Blood Pressure: 625 mmHg     Is BP treated: Yes     HDL Cholesterol: 66.7 mg/dL     Total Cholesterol: 150 mg/dL I have reviewed the [place], Fam and Soc history. Patient Active Problem List   Diagnosis Date Noted  . Osteoporosis 09/07/2019    Priority: High  . GAD (generalized anxiety disorder) 09/07/2019    Priority: High  . Essential hypertension 09/07/2019    Priority: High  . H/O lymph node biopsy - abnl follicles /B cells 63/89/3734    Priority: High    Dr. Jana Hakim: at risk for developing CLL.   Marland Kitchen Peripheral neuropathy 02/03/2017    Priority: High  . Combined hyperlipidemia associated with type 2 diabetes mellitus ([place]) 09/25/2011    Priority: High  . Type 2 diabetes mellitus with complication, with long-term current use of insulin ([place]) 04/24/2009    Priority: High  . Fibromyalgia 11/30/2006     Priority: High  . Major depression, recurrent, chronic ([place]) 11/30/1978    Priority: High  . 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 Jana Hakim 10/2019; recheck IF > 10,000 consistently.    . Bilateral primary osteoarthritis of knee 09/07/2019    Priority: Medium  . Essential tremor 02/03/2017    Priority: Medium  . History of breast cancer 03/23/2012    Priority: Medium    > 10 year, routine f/u.    Marland Kitchen GERD 04/24/2009    Priority: Medium  . Irritable bowel syndrome with diarrhea 04/24/2009    Priority: Medium  . Colon polyps  04/24/2009    Priority: Medium  . Status post left hip replacement 07/17/2016    Priority: Low  . History of esophageal stricture 04/24/2009    Priority: Low  . Leukocytosis 09/23/2019  . SOB (shortness of breath) 09/24/2015    Ongoing for years; Cardiology evaluation with Dr. Oval Linsey: nonspecific changes on EKG; echocardiogram: mild diastolic dysfunction, no wall motion abnormalities; negative myocardial perfusion stress test. 2016     Social History: Patient  reports that she quit smoking about 16 years ago. She has never used smokeless tobacco. She reports that she does not drink alcohol and does not use drugs.  Review of Systems: Ophthalmic: negative for eye pain, loss of vision or double vision Cardiovascular: negative for chest pain Respiratory: negative for SOB or persistent cough Gastrointestinal: negative for abdominal pain Genitourinary: negative for dysuria or gross hematuria MSK: negative for foot lesions Neurologic: negative for weakness or gait disturbance  Objective  Vitals: BP 132/74   Pulse 71   Temp (!) 97.5 F (36.4 C)   Resp 16   Ht 5\' 4"  (1.626 m)   Wt 170 lb 6.4 oz (77.3 kg)   SpO2 99%   BMI 29.25 kg/m  General: well appearing, no acute distress  Psych:  Alert and oriented, normal mood and affect HEENT:  Normocephalic, atraumatic, moist mucous membranes, supple neck   Cardiovascular:  Nl S1 and S2, RRR without murmur, gallop or rub. no edema Respiratory:  Good breath sounds bilaterally, CTAB with normal effort, no rales Gastrointestinal: normal BS, soft, nontender Skin:  Warm, no rashes Neurologic:   Mental status is normal. normal gait     Diabetic education: ongoing education regarding chronic disease management for diabetes was given today. We continue to reinforce the ABC's of diabetic management: A1c (<7 or 8 dependent upon patient), tight blood pressure control, and cholesterol management with goal LDL < 100 minimally. We discuss diet strategies, exercise recommendations, medication options and possible side effects. At each visit, we review recommended immunizations and preventive care recommendations for diabetics and stress that good diabetic control can prevent other problems. See below for this patient's data.    Commons side effects, risks, benefits, and alternatives for medications and treatment plan prescribed today were discussed, and the patient expressed understanding of the given instructions. Patient is instructed to call or message via MyChart if he/she has any questions or concerns regarding our treatment plan. No barriers to understanding were identified. We discussed Red Flag symptoms and signs in detail. Patient expressed understanding regarding what to do in case of urgent or emergency type symptoms.   Medication list was reconciled, printed and provided to the patient in AVS. Patient instructions and summary information was reviewed with the patient as documented in the AVS. This note was prepared with assistance of Dragon voice recognition software. Occasional wrong-word or sound-a-like substitutions may have occurred due to the inherent limitations of voice recognition software  This visit occurred during the SARS-CoV-2 public health emergency.  Safety protocols were in place, including screening questions prior to the visit,  additional usage of staff PPE, and extensive cleaning of exam room while observing appropriate contact time as indicated for disinfecting solutions.

## 2020-11-18 ENCOUNTER — Encounter: Payer: Self-pay | Admitting: Family Medicine

## 2020-11-18 MED ORDER — GUAIFENESIN-CODEINE 100-10 MG/5ML PO SOLN: 5.0000 mL | Freq: Four times a day (QID) | ORAL | 0 refills | Status: DC | PRN

## 2020-11-19 ENCOUNTER — Ambulatory Visit: Payer: Medicare Other

## 2020-11-19 ENCOUNTER — Other Ambulatory Visit: Payer: Medicare Other

## 2020-11-27 ENCOUNTER — Other Ambulatory Visit: Payer: Self-pay | Admitting: Family Medicine

## 2020-12-05 ENCOUNTER — Encounter: Payer: Self-pay | Admitting: Internal Medicine

## 2020-12-31 ENCOUNTER — Encounter: Payer: Self-pay | Admitting: Family Medicine

## 2020-12-31 ENCOUNTER — Other Ambulatory Visit: Payer: Self-pay | Admitting: Family Medicine

## 2020-12-31 DIAGNOSIS — M810 Age-related osteoporosis without current pathological fracture: Secondary | ICD-10-CM

## 2021-01-01 ENCOUNTER — Other Ambulatory Visit: Payer: Self-pay

## 2021-01-01 ENCOUNTER — Telehealth (INDEPENDENT_AMBULATORY_CARE_PROVIDER_SITE_OTHER): Payer: Medicare Other | Admitting: Family Medicine

## 2021-01-01 ENCOUNTER — Encounter: Payer: Self-pay | Admitting: Family Medicine

## 2021-01-01 DIAGNOSIS — R519 Headache, unspecified: Secondary | ICD-10-CM | POA: Diagnosis not present

## 2021-01-01 MED ORDER — GABAPENTIN 100 MG PO CAPS
100.0000 mg | ORAL_CAPSULE | Freq: Three times a day (TID) | ORAL | 1 refills | Status: DC | PRN
Start: 2021-01-01 — End: 2021-04-11

## 2021-01-01 NOTE — Progress Notes (Signed)
Virtual Visit via Video Note  Subjective  CC:  Chief Complaint  Patient presents with  . Ear Pain    Bone behind right upper ear, started two weeks ago     I connected with [name] on 01/01/21 at  2:00 PM EST by a video enabled telemedicine application and verified that I am speaking with the correct person using two identifiers. Location patient: Home Location provider: [facility] [place], Office Persons participating in the virtual visit: [place], Leamon Arnt, MD Reymundo Poll CMA  I discussed the limitations of evaluation and management by telemedicine and the availability of in person appointments. The patient expressed understanding and agreed to proceed. HPI: Cassandra Johnston is a 70 y.o. female who was contacted today to address the problems listed above in the chief complaint. . Complains of 2-week history of intermittent pain posterior to ear.  Has sharp fleeting pains.  Over the last week has been more consistent.  Has had this pain on and off over the years.  Always has resolved spontaneously.  No rash, no history of shingles.  No other pain problems right now.  She does suffer from fibromyalgia.  Denies ear pain, scalp lesions, scalp irritation, sore throat.  Has used Tylenol and Advil without relief.  Pain does not interfere with sleep. Assessment  1. Pain of scalp      Plan   Pain: Unclear etiology.  Most consistent with neuropathic or neuralgia pain.  Has been intermittent and chronic over the years but worse recently.  Trial of gabapentin started.  Recommend in office visit if worsens or persist for examination.  Patient understands and agrees I discussed the assessment and treatment plan with the patient. The patient was provided an opportunity to ask questions and all were answered. The patient agreed with the plan and demonstrated an understanding of the instructions.   The patient was advised to call  back or seek an in-person evaluation if the symptoms worsen or if the condition fails to improve as anticipated. Follow up: No follow-ups on file.  05/09/2021  Meds ordered this encounter  Medications  . gabapentin (NEURONTIN) 100 MG capsule    Sig: Take 1-3 capsules (100-300 mg total) by mouth 3 (three) times daily as needed.    Dispense:  60 capsule    Refill:  1      I reviewed the patients updated PMH, FH, and SocHx.    Patient Active Problem List   Diagnosis Date Noted  . Osteoporosis 09/07/2019    Priority: High  . GAD (generalized anxiety disorder) 09/07/2019    Priority: High  . Essential hypertension 09/07/2019    Priority: High  . H/O lymph node biopsy - abnl follicles /B cells [id number]/9629    Priority: High  . Peripheral neuropathy 02/03/2017    Priority: High  . Combined hyperlipidemia associated with type 2 diabetes mellitus ([place]) 09/25/2011    Priority: High  . Type 2 diabetes mellitus with complication, with long-term current use of insulin ([place]) 04/24/2009    Priority: High  . Fibromyalgia 11/30/2006    Priority: High  . Major depression, recurrent, chronic ([place]) 11/30/1978    Priority: High  . 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 Bilateral primary osteoarthritis of knee 09/07/2019    Priority: Medium  . Essential tremor 02/03/2017    Priority: Medium  . History of breast cancer 03/23/2012    Priority: Medium  .  GERD 04/24/2009    Priority: Medium  . Irritable bowel syndrome with diarrhea 04/24/2009    Priority: Medium  . Colon polyps 04/24/2009    Priority: Medium  . Status post left hip replacement 07/17/2016    Priority: Low  . History of esophageal stricture 04/24/2009    Priority: Low  . History of colon polyps 11/06/2020  . Leukocytosis 09/23/2019  . SOB (shortness of breath) 09/24/2015   Current Meds  Medication Sig  . benazepril-hydrochlorthiazide (LOTENSIN HCT) 10-12.5 MG tablet Take 1 tablet by mouth daily.  .  busPIRone (BUSPAR) 7.5 MG tablet Take 1 tablet (7.5 mg total) by mouth 2 (two) times daily.  . DULoxetine (CYMBALTA) 60 MG capsule TAKE 1 CAPSULE(60 MG) BY MOUTH DAILY  . empagliflozin (JARDIANCE) 25 MG TABS tablet Take 1 tablet by mouth daily.  . Famotidine (PEPCID PO) Take 40 mg by mouth 2 (two) times daily.  Marland Kitchen gabapentin (NEURONTIN) 100 MG capsule Take 1-3 capsules (100-300 mg total) by mouth 3 (three) times daily as needed.  . Insulin Glargine (BASAGLAR KWIKPEN) 100 UNIT/ML SOPN Inject 50 Units into the skin daily.  . metFORMIN (GLUCOPHAGE) 500 MG tablet Take 1,000 mg by mouth 2 (two) times daily.  Glory Rosebush ULTRA test strip TEST TID  . rosuvastatin (CRESTOR) 10 MG tablet TAKE 1 TABLET(10 MG) BY MOUTH AT BEDTIME  . tiZANidine (ZANAFLEX) 4 MG tablet Take 1 tablet (4 mg total) by mouth at bedtime.  . TRULICITY 1.5 NI/6.2VO SOPN Inject 3 mg into the skin once a week. Tuesdays  . venlafaxine XR (EFFEXOR-XR) 150 MG 24 hr capsule TAKE 1 CAPSULE(150 MG) BY MOUTH DAILY WITH BREAKFAST    Allergies: Patient is allergic to naproxen and sulfa antibiotics. Family History: Patient family history includes Atrial fibrillation in her father; CAD in her father; Colon cancer in her mother; Diabetes in her mother; Pancreatic cancer in her father. Social History:  Patient  reports that she quit smoking about 17 years ago. She has never used smokeless tobacco. She reports that she does not drink alcohol and does not use drugs.  Review of Systems: Constitutional: Negative for fever malaise or anorexia Cardiovascular: negative for chest pain Respiratory: negative for SOB or persistent cough Gastrointestinal: negative for abdominal pain  OBJECTIVE Vitals: There were no vitals taken for this visit. General: no acute distress , A&Ox3  Leamon Arnt, MD

## 2021-01-15 ENCOUNTER — Other Ambulatory Visit: Payer: Self-pay

## 2021-01-15 ENCOUNTER — Ambulatory Visit (AMBULATORY_SURGERY_CENTER): Payer: Medicare Other | Admitting: *Deleted

## 2021-01-15 VITALS — Ht 64.0 in | Wt 183.0 lb

## 2021-01-15 DIAGNOSIS — Z86010 Personal history of colonic polyps: Secondary | ICD-10-CM

## 2021-01-15 DIAGNOSIS — Z800 Family history of malignant neoplasm of digestive organs: Secondary | ICD-10-CM

## 2021-01-15 MED ORDER — NA SULFATE-K SULFATE-MG SULF 17.5-3.13-1.6 GM/177ML PO SOLN: ORAL | 0 refills | Status: DC

## 2021-01-15 NOTE — Progress Notes (Signed)
Patient's pre-visit was done today over the phone with the patient due to COVID-19 pandemic. Name,DOB and address verified. Insurance verified. Patient denies any allergies to Eggs and Soy. Patient denies any problems with anesthesia/sedation. Patient denies taking diet pills or blood thinners. Packet of Prep instructions mailed to patient including a copy of a consent form and pre-procedure patient acknowledgement form (with envelope to mail back to us)-pt is aware. Patient understands to call us back with any questions or concerns. COVID-19 vaccines completed x3, per patient. Patient is aware of our care-partner policy and [id number]-[id number] safety protocol. EMMI education assigned to the patient for the procedure, sent to [facility].

## 2021-01-23 ENCOUNTER — Encounter: Payer: Self-pay | Admitting: Internal Medicine

## 2021-01-29 ENCOUNTER — Ambulatory Visit (AMBULATORY_SURGERY_CENTER): Payer: Medicare Other | Admitting: Internal Medicine

## 2021-01-29 ENCOUNTER — Other Ambulatory Visit: Payer: Self-pay

## 2021-01-29 ENCOUNTER — Encounter: Payer: Self-pay | Admitting: Internal Medicine

## 2021-01-29 VITALS — BP 162/64 | HR 66 | Temp 97.0°F | Resp 13 | Ht 64.0 in | Wt 183.0 lb

## 2021-01-29 DIAGNOSIS — Z86010 Personal history of colonic polyps: Secondary | ICD-10-CM

## 2021-01-29 DIAGNOSIS — Z800 Family history of malignant neoplasm of digestive organs: Secondary | ICD-10-CM | POA: Diagnosis not present

## 2021-01-29 DIAGNOSIS — D123 Benign neoplasm of transverse colon: Secondary | ICD-10-CM | POA: Diagnosis not present

## 2021-01-29 DIAGNOSIS — D124 Benign neoplasm of descending colon: Secondary | ICD-10-CM | POA: Diagnosis not present

## 2021-01-29 MED ORDER — SODIUM CHLORIDE 0.9 % IV SOLN: 500.0000 mL | INTRAVENOUS | Status: DC

## 2021-01-29 NOTE — Progress Notes (Signed)
Called to room to assist during endoscopic procedure.  Patient ID and intended procedure confirmed with present staff. Received instructions for my participation in the procedure from the performing physician.  

## 2021-01-29 NOTE — Op Note (Signed)
[facility] Patient Name: Cassandra Johnston Procedure Date: 01/29/2021 8:29 AM MRN: [id number] Endoscopist: Docia Chuck. [name] , MD Age: 70 Referring MD:  Date of Birth: 01-Dec-1950 Gender: Female Account #: [id number] Procedure:                Colonoscopy with cold snare polypectomy x 2 Indications:              High risk colon cancer surveillance: Personal                            history of multiple adenomas. Also mother with a                            history of colon cancer at 37. Previous                            examinations 1997, 2002, 2005, 2010 Medicines:                Monitored Anesthesia Care Procedure:                Pre-Anesthesia Assessment:                           - Prior to the procedure, a History and Physical                            was performed, and patient medications and                            allergies were reviewed. The patient's tolerance of                            previous anesthesia was also reviewed. The risks                            and benefits of the procedure and the sedation                            options and risks were discussed with the patient.                            All questions were answered, and informed consent                            was obtained. Prior Anticoagulants: The patient has                            taken no previous anticoagulant or antiplatelet                            agents. ASA Grade Assessment: II - A patient with                            mild systemic disease. After reviewing the risks  and benefits, the patient was deemed in                            satisfactory condition to undergo the procedure.                           After obtaining informed consent, the colonoscope                            was passed under direct vision. Throughout the                            procedure, the patient's blood pressure, pulse, and                            oxygen  saturations were monitored continuously. The                            Olympus CF-HQ190 [id number]) Colonoscope was                            introduced through the anus and advanced to the the                            cecum, identified by appendiceal orifice and                            ileocecal valve. The ileocecal valve, appendiceal                            orifice, and rectum were photographed. The quality                            of the bowel preparation was excellent. The                            colonoscopy was performed without difficulty. The                            patient tolerated the procedure well. The bowel                            preparation used was SUPREP via split dose                            instruction. Scope In: 8:39:39 AM Scope Out: 8:56:48 AM Scope Withdrawal Time: 0 hours 11 minutes 42 seconds  Total Procedure Duration: 0 hours 17 minutes 9 seconds  Findings:                 Two polyps were found in the descending colon and                            transverse colon. The polyps were 2 to 4 mm in  size. These polyps were removed with a cold snare.                            Resection and retrieval were complete.                           Multiple small-mouthed diverticula were found in                            the sigmoid colon.                           The exam was otherwise without abnormality on                            direct and retroflexion views. Complications:            No immediate complications. Estimated blood loss:                            None. Estimated Blood Loss:     Estimated blood loss: none. Impression:               - Two 2 to 4 mm polyps in the descending colon and                            in the transverse colon, removed with a cold snare.                            Resected and retrieved.                           - Diverticulosis in the sigmoid colon.                           - The  examination was otherwise normal on direct                            and retroflexion views. Recommendation:           - Repeat colonoscopy in 5 years for surveillance.                           - Patient has a contact number available for                            emergencies. The signs and symptoms of potential                            delayed complications were discussed with the                            patient. Return to normal activities tomorrow.                            Written discharge instructions were provided to  the                            patient.                           - Resume previous diet.                           - Continue present medications.                           - Await pathology results. Docia Chuck. [name], MD 01/29/2021 9:02:17 AM This report has been signed electronically.

## 2021-01-29 NOTE — Patient Instructions (Signed)
Please read handouts provided. Continue present medications. Await pathology results.   YOU HAD AN ENDOSCOPIC PROCEDURE TODAY AT THE [facility]:   Refer to the procedure report that was given to you for any specific questions about what was found during the examination.  If the procedure report does not answer your questions, please call your gastroenterologist to clarify.  If you requested that your care partner not be given the details of your procedure findings, then the procedure report has been included in a sealed envelope for you to review at your convenience later.  YOU SHOULD EXPECT: Some feelings of bloating in the abdomen. Passage of more gas than usual.  Walking can help get rid of the air that was put into your GI tract during the procedure and reduce the bloating. If you had a lower endoscopy (such as a colonoscopy or flexible sigmoidoscopy) you may notice spotting of blood in your stool or on the toilet paper. If you underwent a bowel prep for your procedure, you may not have a normal bowel movement for a few days.  Please Note:  You might notice some irritation and congestion in your nose or some drainage.  This is from the oxygen used during your procedure.  There is no need for concern and it should clear up in a day or so.  SYMPTOMS TO REPORT IMMEDIATELY:  Following lower endoscopy (colonoscopy or flexible sigmoidoscopy):  Excessive amounts of blood in the stool  Significant tenderness or worsening of abdominal pains  Swelling of the abdomen that is new, acute  Fever of 100F or higher   For urgent or emergent issues, a gastroenterologist can be reached at any hour by calling [phone]. Do not use MyChart messaging for urgent concerns.    DIET:  We do recommend a small meal at first, but then you may proceed to your regular diet.  Drink plenty of fluids but you should avoid alcoholic beverages for 24 hours.  ACTIVITY:  You should plan to take it easy  for the rest of today and you should NOT DRIVE or use heavy machinery until tomorrow (because of the sedation medicines used during the test).    FOLLOW UP: Our staff will call the number listed on your records 48-72 hours following your procedure to check on you and address any questions or concerns that you may have regarding the information given to you following your procedure. If we do not reach you, we will leave a message.  We will attempt to reach you two times.  During this call, we will ask if you have developed any symptoms of COVID 19. If you develop any symptoms (ie: fever, flu-like symptoms, shortness of breath, cough etc.) before then, please call [phone].  If you test positive for Covid 19 in the 2 weeks post procedure, please call and report this information to us.    If any biopsies were taken you will be contacted by phone or by letter within the next 1-3 weeks.  Please call us at [phone] if you have not heard about the biopsies in 3 weeks.    SIGNATURES/CONFIDENTIALITY: You and/or your care partner have signed paperwork which will be entered into your electronic medical record.  These signatures attest to the fact that that the information above on your After Visit Summary has been reviewed and is understood.  Full responsibility of the confidentiality of this discharge information lies with you and/or your care-partner.  

## 2021-01-29 NOTE — Progress Notes (Signed)
Pt's states no medical or surgical changes since previsit or office visit. 

## 2021-01-29 NOTE — Progress Notes (Signed)
Report to PACU, RN, vss, BBS= Clear.  

## 2021-01-31 ENCOUNTER — Telehealth: Payer: Self-pay

## 2021-01-31 NOTE — Telephone Encounter (Signed)
  Follow up Call-  Call back number 01/29/2021  Post procedure Call Back phone  # [phone]  Permission to leave phone message Yes  Some recent data might be hidden     Patient questions:  Do you have a fever, pain , or abdominal swelling? No. Pain Score  0 *  Have you tolerated food without any problems? Yes.    Have you been able to return to your normal activities? Yes.    Do you have any questions about your discharge instructions: Diet   No. Medications  No. Follow up visit  No.  Do you have questions or concerns about your Care? No.  Actions: * If pain score is 4 or above: No action needed, pain <4.  1. Have you developed a fever since your procedure? no  2.   Have you had an respiratory symptoms (SOB or cough) since your procedure? no  3.   Have you tested positive for COVID 19 since your procedure no  4.   Have you had any family members/close contacts diagnosed with the COVID 19 since your procedure?  no   If yes to any of these questions please route to Joylene John, RN and Joella Prince, RN

## 2021-01-31 NOTE — Telephone Encounter (Signed)
First post procedure follow up call, no answer 

## 2021-02-06 ENCOUNTER — Encounter: Payer: Self-pay | Admitting: Internal Medicine

## 2021-02-18 ENCOUNTER — Ambulatory Visit: Payer: Medicare Other

## 2021-02-18 ENCOUNTER — Other Ambulatory Visit: Payer: Medicare Other

## 2021-02-27 DIAGNOSIS — I10 Essential (primary) hypertension: Secondary | ICD-10-CM | POA: Diagnosis not present

## 2021-02-27 DIAGNOSIS — Z853 Personal history of malignant neoplasm of breast: Secondary | ICD-10-CM | POA: Diagnosis not present

## 2021-02-27 DIAGNOSIS — E1165 Type 2 diabetes mellitus with hyperglycemia: Secondary | ICD-10-CM | POA: Diagnosis not present

## 2021-02-27 DIAGNOSIS — Z794 Long term (current) use of insulin: Secondary | ICD-10-CM | POA: Diagnosis not present

## 2021-02-27 DIAGNOSIS — E1169 Type 2 diabetes mellitus with other specified complication: Secondary | ICD-10-CM | POA: Diagnosis not present

## 2021-02-27 DIAGNOSIS — E785 Hyperlipidemia, unspecified: Secondary | ICD-10-CM | POA: Diagnosis not present

## 2021-02-27 DIAGNOSIS — E559 Vitamin D deficiency, unspecified: Secondary | ICD-10-CM | POA: Diagnosis not present

## 2021-04-04 ENCOUNTER — Telehealth: Payer: Self-pay | Admitting: Family Medicine

## 2021-04-04 NOTE — Chronic Care Management (AMB) (Signed)
  Chronic Care Management   Outreach Note  04/04/2021 Name: Rekita Miotke MRN: [id number] DOB: Apr 27, 1951  Referred by: Leamon Arnt, MD Reason for referral : No chief complaint on file.   An unsuccessful telephone outreach was attempted today. The patient was referred to the pharmacist for assistance with care management and care coordination.   Follow Up Plan:   Lauretta Grill [facility] Scheduler

## 2021-04-10 ENCOUNTER — Other Ambulatory Visit: Payer: Self-pay

## 2021-04-10 ENCOUNTER — Ambulatory Visit
Admission: RE | Admit: 2021-04-10 | Discharge: 2021-04-10 | Disposition: A | Discharge status: Home / Self Care | Payer: Medicare Other | Source: Ambulatory Visit | Attending: Family Medicine | Admitting: Family Medicine

## 2021-04-10 DIAGNOSIS — Z0000 Encounter for general adult medical examination without abnormal findings: Secondary | ICD-10-CM

## 2021-04-10 DIAGNOSIS — Z1231 Encounter for screening mammogram for malignant neoplasm of breast: Secondary | ICD-10-CM | POA: Diagnosis not present

## 2021-04-11 ENCOUNTER — Ambulatory Visit (INDEPENDENT_AMBULATORY_CARE_PROVIDER_SITE_OTHER): Payer: Medicare Other

## 2021-04-11 DIAGNOSIS — Z0000 Encounter for general adult medical examination without abnormal findings: Secondary | ICD-10-CM

## 2021-04-11 NOTE — Patient Instructions (Addendum)
Ms. Biber , Thank you for taking time to come for your Medicare Wellness Visit. I appreciate your ongoing commitment to your health goals. Please review the following plan we discussed and let me know if I can assist you in the future.   Screening recommendations/referrals: Colonoscopy: Done 01/29/21 Mammogram: Done 04/14/19 Bone Density: Scheduled for 05/22/21 Recommended yearly ophthalmology/optometry visit for glaucoma screening and checkup Recommended yearly dental visit for hygiene and checkup  Vaccinations: Influenza vaccine: Up to date Pneumococcal vaccine: Up to date Tdap vaccine: Due and discussed Shingles vaccine: Shingrix discussed. Please contact your pharmacy for coverage information.    Covid-19:Completed 1/11 & 01/03/20  Advanced directives: Please bring a copy of your health care power of attorney and living will to the office at your convenience.  Conditions/risks identified: lose 20 lbs   Next appointment: Follow up in one year for your annual wellness visit    Preventive Care 65 Years and Older, Female Preventive care refers to lifestyle choices and visits with your health care provider that can promote health and wellness. What does preventive care include?  A yearly physical exam. This is also called an annual well check.  Dental exams once or twice a year.  Routine eye exams. Ask your health care provider how often you should have your eyes checked.  Personal lifestyle choices, including:  Daily care of your teeth and gums.  Regular physical activity.  Eating a healthy diet.  Avoiding tobacco and drug use.  Limiting alcohol use.  Practicing safe sex.  Taking low-dose aspirin every day.  Taking vitamin and mineral supplements as recommended by your health care provider. What happens during an annual well check? The services and screenings done by your health care provider during your annual well check will depend on your age, overall health, lifestyle  risk factors, and family history of disease. Counseling  Your health care provider may ask you questions about your:  Alcohol use.  Tobacco use.  Drug use.  Emotional well-being.  Home and relationship well-being.  Sexual activity.  Eating habits.  History of falls.  Memory and ability to understand (cognition).  Work and work Statistician.  Reproductive health. Screening  You may have the following tests or measurements:  Height, weight, and BMI.  Blood pressure.  Lipid and cholesterol levels. These may be checked every 5 years, or more frequently if you are over 9 years old.  Skin check.  Lung cancer screening. You may have this screening every year starting at age 58 if you have a 30-pack-year history of smoking and currently smoke or have quit within the past 15 years.  Fecal occult blood test (FOBT) of the stool. You may have this test every year starting at age 41.  Flexible sigmoidoscopy or colonoscopy. You may have a sigmoidoscopy every 5 years or a colonoscopy every 10 years starting at age 37.  Hepatitis C blood test.  Hepatitis B blood test.  Sexually transmitted disease (STD) testing.  Diabetes screening. This is done by checking your blood sugar (glucose) after you have not eaten for a while (fasting). You may have this done every 1-3 years.  Bone density scan. This is done to screen for osteoporosis. You may have this done starting at age 12.  Mammogram. This may be done every 1-2 years. Talk to your health care provider about how often you should have regular mammograms. Talk with your health care provider about your test results, treatment options, and if necessary, the need for more tests.  Vaccines  Your health care provider may recommend certain vaccines, such as:  Influenza vaccine. This is recommended every year.  Tetanus, diphtheria, and acellular pertussis (Tdap, Td) vaccine. You may need a Td booster every 10 years.  Zoster vaccine.  You may need this after age 22.  Pneumococcal 13-valent conjugate (PCV13) vaccine. One dose is recommended after age 3.  Pneumococcal polysaccharide (PPSV23) vaccine. One dose is recommended after age 80. Talk to your health care provider about which screenings and vaccines you need and how often you need them. This information is not intended to replace advice given to you by your health care provider. Make sure you discuss any questions you have with your health care provider. Document Released: 12/13/2015 Document Revised: 08/05/2016 Document Reviewed: 09/17/2015 Elsevier Interactive Patient Education  2017 [place] Prevention in the Home Falls can cause injuries. They can happen to people of all ages. There are many things you can do to make your home safe and to help prevent falls. What can I do on the outside of my home?  Regularly fix the edges of walkways and driveways and fix any cracks.  Remove anything that might make you trip as you walk through a door, such as a raised step or threshold.  Trim any bushes or trees on the path to your home.  Use bright outdoor lighting.  Clear any walking paths of anything that might make someone trip, such as rocks or tools.  Regularly check to see if handrails are loose or broken. Make sure that both sides of any steps have handrails.  Any raised decks and porches should have guardrails on the edges.  Have any leaves, snow, or ice cleared regularly.  Use sand or salt on walking paths during winter.  Clean up any spills in your garage right away. This includes oil or grease spills. What can I do in the bathroom?  Use night lights.  Install grab bars by the toilet and in the tub and shower. Do not use towel bars as grab bars.  Use non-skid mats or decals in the tub or shower.  If you need to sit down in the shower, use a plastic, non-slip stool.  Keep the floor dry. Clean up any water that spills on the floor as soon  as it happens.  Remove soap buildup in the tub or shower regularly.  Attach bath mats securely with double-sided non-slip rug tape.  Do not have throw rugs and other things on the floor that can make you trip. What can I do in the bedroom?  Use night lights.  Make sure that you have a light by your bed that is easy to reach.  Do not use any sheets or blankets that are too big for your bed. They should not hang down onto the floor.  Have a firm chair that has side arms. You can use this for support while you get dressed.  Do not have throw rugs and other things on the floor that can make you trip. What can I do in the kitchen?  Clean up any spills right away.  Avoid walking on wet floors.  Keep items that you use a lot in easy-to-reach places.  If you need to reach something above you, use a strong step stool that has a grab bar.  Keep electrical cords out of the way.  Do not use floor polish or wax that makes floors slippery. If you must use wax, use non-skid floor wax.  Do not have throw rugs and other things on the floor that can make you trip. What can I do with my stairs?  Do not leave any items on the stairs.  Make sure that there are handrails on both sides of the stairs and use them. Fix handrails that are broken or loose. Make sure that handrails are as long as the stairways.  Check any carpeting to make sure that it is firmly attached to the stairs. Fix any carpet that is loose or worn.  Avoid having throw rugs at the top or bottom of the stairs. If you do have throw rugs, attach them to the floor with carpet tape.  Make sure that you have a light switch at the top of the stairs and the bottom of the stairs. If you do not have them, ask someone to add them for you. What else can I do to help prevent falls?  Wear shoes that:  Do not have high heels.  Have rubber bottoms.  Are comfortable and fit you well.  Are closed at the toe. Do not wear sandals.  If  you use a stepladder:  Make sure that it is fully opened. Do not climb a closed stepladder.  Make sure that both sides of the stepladder are locked into place.  Ask someone to hold it for you, if possible.  Clearly mark and make sure that you can see:  Any grab bars or handrails.  First and last steps.  Where the edge of each step is.  Use tools that help you move around (mobility aids) if they are needed. These include:  Canes.  Walkers.  Scooters.  Crutches.  Turn on the lights when you go into a dark area. Replace any light bulbs as soon as they burn out.  Set up your furniture so you have a clear path. Avoid moving your furniture around.  If any of your floors are uneven, fix them.  If there are any pets around you, be aware of where they are.  Review your medicines with your doctor. Some medicines can make you feel dizzy. This can increase your chance of falling. Ask your doctor what other things that you can do to help prevent falls. This information is not intended to replace advice given to you by your health care provider. Make sure you discuss any questions you have with your health care provider. Document Released: 09/12/2009 Document Revised: 04/23/2016 Document Reviewed: 12/21/2014 Elsevier Interactive Patient Education  2017 [employer].

## 2021-04-11 NOTE — Progress Notes (Signed)
Virtual Visit via Telephone Note  I connected with  Cassandra Johnston on 04/11/21 at  8:45 AM EDT by telephone and verified that I am speaking with the correct person using two identifiers.  Medicare Annual Wellness visit completed telephonically due to Covid-19 pandemic.   Persons participating in this call: This Health Coach and this patient.   Location: Patient: Home Provider: Office   I discussed the limitations, risks, security and privacy concerns of performing an evaluation and management service by telephone and the availability of in person appointments. The patient expressed understanding and agreed to proceed.  Unable to perform video visit due to video visit attempted and failed and/or patient does not have video capability.   Some vital signs may be absent or patient reported.   [name], LPN    Subjective:   Cassandra Johnston is a 70 y.o. female who presents for Medicare Annual (Subsequent) preventive examination.  Review of Systems     Cardiac Risk Factors include: advanced age (>59men, >76 women);hypertension;diabetes mellitus;dyslipidemia;obesity (BMI >30kg/m2)     Objective:    There were no vitals filed for this visit. There is no height or weight on file to calculate BMI.  Advanced Directives 04/11/2021 03/25/2020 07/17/2016 07/10/2016  Does Patient Have a Medical Advance Directive? Yes Yes Yes Yes  Type of Advance Directive Healthcare Power of Attorney Living will;Healthcare Power of [place]  Does patient want to make changes to medical advance directive? - No - Patient declined No - Patient declined No - Patient declined  Copy of [place] in Chart? No - copy requested No - copy requested No - copy requested No - copy requested    Current Medications (verified) Outpatient Encounter Medications as of 04/11/2021  Medication Sig  . busPIRone (BUSPAR) 7.5 MG tablet  Take 1 tablet (7.5 mg total) by mouth 2 (two) times daily.  . DULoxetine (CYMBALTA) 60 MG capsule TAKE 1 CAPSULE(60 MG) BY MOUTH DAILY  . empagliflozin (JARDIANCE) 25 MG TABS tablet Take 1 tablet by mouth daily.  . Famotidine (PEPCID PO) Take 40 mg by mouth 2 (two) times daily.  . Insulin Glargine (BASAGLAR KWIKPEN) 100 UNIT/ML SOPN Inject 45 Units into the skin daily.  . metFORMIN (GLUCOPHAGE) 500 MG tablet Take 1,000 mg by mouth 2 (two) times daily.  Glory Rosebush ULTRA test strip TEST TID  . rosuvastatin (CRESTOR) 10 MG tablet TAKE 1 TABLET(10 MG) BY MOUTH AT BEDTIME  . tiZANidine (ZANAFLEX) 4 MG tablet Take 1 tablet (4 mg total) by mouth at bedtime.  . TRULICITY 1.5 0000000 SOPN Inject 3 mg into the skin once a week. Tuesdays  . venlafaxine XR (EFFEXOR-XR) 150 MG 24 hr capsule TAKE 1 CAPSULE(150 MG) BY MOUTH DAILY WITH BREAKFAST  . benazepril-hydrochlorthiazide (LOTENSIN HCT) 10-12.5 MG tablet Take 1 tablet by mouth daily. (Patient not taking: Reported on 04/11/2021)  . [DISCONTINUED] gabapentin (NEURONTIN) 100 MG capsule Take 1-3 capsules (100-300 mg total) by mouth 3 (three) times daily as needed. (Patient not taking: Reported on 04/11/2021)  . [DISCONTINUED] Na Sulfate-K Sulfate-Mg Sulf 17.5-3.13-1.6 GM/177ML SOLN Suprep (no substitutions)-TAKE AS DIRECTED.   No facility-administered encounter medications on file as of 04/11/2021.    Allergies (verified) Naproxen and Sulfa antibiotics   History: Past Medical History:  Diagnosis Date  . Anxiety   . Arthritis   . Breast cancer ([place]) 2010   left  . Breast infection    left breast  .  Chest pain 09/24/2015  . Complication of anesthesia    patient states she becomes combative with anesthesia with some surgeries  . Depression   . Diabetes mellitus   . Diverticulosis   . Esophageal stricture   . Essential hypertension 09/07/2019  . Family history of adverse reaction to anesthesia    father was also combative per patient report   .  GERD (gastroesophageal reflux disease)   . H/O lymph node biopsy - abnl follicles /B cells 25/0/5397   Dr. Jana Hakim: at risk for developing CLL.  Marland Kitchen Heart murmur   . History of left breast cancer 01/04/2014  . Hypertension   . 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 Osteoarthritis of left hip 07/17/2016  . Osteoporosis 09/07/2019  . Palpitations 09/24/2015  . Personal history of radiation therapy   . Pneumonia    hx of   . PONV (postoperative nausea and vomiting)   . Shortness of breath 09/24/2015   weight related per patient   . Tremor    Past Surgical History:  Procedure Laterality Date  . ABDOMINAL HYSTERECTOMY    . APPENDECTOMY    . BREAST LUMPECTOMY  2010  . CHOLECYSTECTOMY    . COLONOSCOPY  04/30/2009   [name]  . CYSTECTOMY     on thyroid  . FRACTURE SURGERY     feet  . HAND SURGERY    . TONSILLECTOMY    . TOTAL HIP ARTHROPLASTY Left 07/17/2016   Procedure: LEFT TOTAL HIP ARTHROPLASTY ANTERIOR APPROACH;  Surgeon: Mcarthur Rossetti, MD;  Location: [facility];  Service: Orthopedics;  Laterality: Left;  . TUBAL LIGATION     Family History  Problem Relation Age of Onset  . Diabetes Mother   . Colon cancer Mother 81  . Atrial fibrillation Father   . CAD Father   . Pancreatic cancer Father   . Colon cancer Maternal Great-grandmother   . Colon polyps Neg Hx   . Esophageal cancer Neg Hx   . Rectal cancer Neg Hx   . Stomach cancer Neg Hx    Social History   Socioeconomic History  . Marital status: Married    Spouse name: Not on file  . Number of children: 2  . Years of education: Associates  . Highest education level: Not on file  Occupational History  . Occupation: Retired  Tobacco Use  . Smoking status: Former Smoker    Quit date: 12/01/2003    Years since quitting: 17.3  . Smokeless tobacco: Never Used  . Tobacco comment: Quit 10+ years ago  Vaping Use  . Vaping Use: Never used   Substance and Sexual Activity  . Alcohol use: No    Comment: Quit 25+ years ago.  . Drug use: No  . Sexual activity: Not on file  Other Topics Concern  . Not on file  Social History Narrative   Epworth Sleepiness Scale = 9 (as of 09/24/2015)         Lives at home with husband and granddaughter.   Right-handed.   1 cup coffee per day.   Social Determinants of Health   Financial Resource Strain: Low Risk   . Difficulty of Paying Living Expenses: Not hard at all  Food Insecurity: No Food Insecurity  . Worried About Charity fundraiser in the Last Year: Never true  . Ran Out of Food in the Last Year: Never true  Transportation Needs: No Transportation Needs  .  Lack of Transportation (Medical): No  . Lack of Transportation (Non-Medical): No  Physical Activity: Insufficiently Active  . Days of Exercise per Week: 3 days  . Minutes of Exercise per Session: 30 min  Stress: No Stress Concern Present  . Feeling of Stress : Not at all  Social Connections: Moderately Integrated  . Frequency of Communication with Friends and Family: More than three times a week  . Frequency of Social Gatherings with Friends and Family: More than three times a week  . Attends Religious Services: More than 4 times per year  . Active Member of Clubs or Organizations: No  . Attends Archivist Meetings: Never  . Marital Status: Married    Tobacco Counseling Counseling given: Not Answered Comment: Quit 10+ years ago   Clinical Intake:  Pre-visit preparation completed: Yes  Pain : No/denies pain     BMI - recorded: 31.41 Nutritional Risks: Nausea/ vomitting/ diarrhea (loose stools often) Diabetes: Yes CBG done?: No Did pt. bring in CBG monitor from home?: No  How often do you need to have someone help you when you read instructions, pamphlets, or other written materials from your doctor or pharmacy?: 1 - Never  Diabetic?Nutrition Risk Assessment:  Has the patient had any N/V/D  within the last 2 months?  Yes  Does the patient have any non-healing wounds?  No  Has the patient had any unintentional weight loss or weight gain?  No   Diabetes:  Is the patient diabetic?  Yes  If diabetic, was a CBG obtained today?  No  Did the patient bring in their glucometer from home?  No  How often do you monitor your CBG's? Weekly .   Financial Strains and Diabetes Management:  Are you having any financial strains with the device, your supplies or your medication? No .  Does the patient want to be seen by Chronic Care Management for management of their diabetes?  No  Would the patient like to be referred to a Nutritionist or for Diabetic Management?  No   Diabetic Exams:  Diabetic Eye Exam: Completed 04/30/20 Diabetic Foot Exam: Overdue, Pt has been advised about the importance in completing this exam. Pt is scheduled for diabetic foot exam on next appt .   Interpreter Needed?: No  Information entered by :: Charlott Rakes, LPN   Activities of Daily Living In your present state of health, do you have any difficulty performing the following activities: 04/11/2021  Hearing? N  Vision? N  Difficulty concentrating or making decisions? N  Walking or climbing stairs? N  Dressing or bathing? N  Doing errands, shopping? N  Preparing Food and eating ? N  Using the Toilet? N  In the past six months, have you accidently leaked urine? N  Do you have problems with loss of bowel control? N  Managing your Medications? N  Managing your Finances? N  Housekeeping or managing your Housekeeping? N  Some recent data might be hidden    Patient Care Team: Leamon Arnt, MD as PCP - General (Family Medicine) Alphonsa Overall, MD as Consulting Physician (General Surgery) Magrinat, Virgie Dad, MD as Consulting Physician (Hematology and Oncology) Madelin Rear, MD as Consulting Physician (Endocrinology) Mcarthur Rossetti, MD as Consulting Physician (Orthopedic Surgery) Marcial Pacas, MD as Consulting Physician (Neurology) Paulla Dolly Tamala Fothergill, DPM as Consulting Physician (Podiatry) Skeet Latch, MD as Attending Physician (Cardiology) Marica Otter, OD as Consulting Physician (Optometry)  Indicate any recent Medical Services you may have received from  other than [facility] providers in the past year (date may be approximate).     Assessment:   This is a routine wellness examination for Cassandra Johnston.  Hearing/Vision screen  Hearing Screening   125Hz  250Hz  500Hz  1000Hz  2000Hz  3000Hz  4000Hz  6000Hz  8000Hz   Right ear:           Left ear:           Comments: Pt denies any hearing issues   Vision Screening Comments: Pt will follow up with Dr Herbert Deaner for annual eye exams   Dietary issues and exercise activities discussed: Current Exercise Habits: Home exercise routine, Type of exercise: walking, Time (Minutes): 30, Frequency (Times/Week): 3, Weekly Exercise (Minutes/Week): 90  Goals Addressed            This Visit's Progress   . Patient Stated       Lose 20 lbs more       Depression Screen PHQ 2/9 Scores 04/11/2021 04/09/2020 03/25/2020  PHQ - 2 Score 1 2 0  PHQ- 9 Score - 11 -    Fall Risk Fall Risk  04/11/2021 04/09/2020 03/25/2020  Falls in the past year? 1 0 0  Number falls in past yr: 1 0 0  Comment carrying groceries - -  Injury with Fall? 0 0 0  Risk for fall due to : Impaired vision;Impaired balance/gait - -  Follow up Falls prevention discussed - Falls evaluation completed;Education provided;Falls prevention discussed    FALL RISK PREVENTION PERTAINING TO THE HOME:  Any stairs in or around the home? Yes  If so, are there any without handrails? No  Home free of loose throw rugs in walkways, pet beds, electrical cords, etc? Yes  Adequate lighting in your home to reduce risk of falls? Yes   ASSISTIVE DEVICES UTILIZED TO PREVENT FALLS:  Life alert? No  Use of a cane, walker or w/c? No  Grab bars in the bathroom? Yes  Shower chair or bench in shower?  Yes  Elevated toilet seat or a handicapped toilet? Yes   TIMED UP AND GO:  Was the test performed? No     Cognitive Function:     6CIT Screen 04/11/2021 03/25/2020  What Year? 0 points 0 points  What month? 0 points 0 points  What time? - 0 points  Count back from 20 0 points 0 points  Months in reverse 0 points 0 points  Repeat phrase 0 points 0 points  Total Score - 0    Immunizations Immunization History  Administered Date(s) Administered  . Fluad Quad(high Dose 65+) 09/07/2019  . Influenza-Unspecified 07/29/2020  . PFIZER(Purple Top)SARS-COV-2 Vaccination 12/11/2019, 01/03/2020  . Pneumococcal Conjugate-13 11/06/2020  . Pneumococcal Polysaccharide-23 09/07/2019    TDAP status: Due, Education has been provided regarding the importance of this vaccine. Advised may receive this vaccine at local pharmacy or Health Dept. Aware to provide a copy of the vaccination record if obtained from local pharmacy or Health Dept. Verbalized acceptance and understanding.  Flu Vaccine status: Up to date  Pneumococcal vaccine status: Up to date  Covid-19 vaccine status: Completed vaccines  Qualifies for Shingles Vaccine? Yes   Zostavax completed No   Shingrix Completed?: No.    Education has been provided regarding the importance of this vaccine. Patient has been advised to call insurance company to determine out of pocket expense if they have not yet received this vaccine. Advised may also receive vaccine at local pharmacy or Health Dept. Verbalized acceptance and understanding.  Screening Tests Health Maintenance  Topic Date Due  . DEXA SCAN  03/07/2011  . COVID-19 Vaccine (3 - Pfizer risk 4-dose series) 01/31/2020  . FOOT EXAM  04/09/2021  . TETANUS/TDAP  03/25/2029 (Originally 04/27/1970)  . OPHTHALMOLOGY EXAM  04/30/2021  . HEMOGLOBIN A1C  05/07/2021  . INFLUENZA VACCINE  06/30/2021  . MAMMOGRAM  04/10/2022  . COLONOSCOPY (Pts 45-64yrs Insurance coverage will need to be  confirmed)  01/29/2026  . Hepatitis C Screening  Completed  . PNA vac Low Risk Adult  Completed  . HPV VACCINES  Aged Out    Health Maintenance  Health Maintenance Due  Topic Date Due  . DEXA SCAN  03/07/2011  . COVID-19 Vaccine (3 - Pfizer risk 4-dose series) 01/31/2020  . FOOT EXAM  04/09/2021    Colorectal cancer screening: Type of screening: Colonoscopy. Completed 01/29/21. Repeat every 5 years  Mammogram status: Completed 04/14/19. Repeat every year  Bone Density status: Completed 03/06/09 Scheduled for 05/22/21. Results reflect: Bone density results: OSTEOPOROSIS. Repeat every 2 years.   Additional Screening:  Hepatitis C Screening: Completed 09/07/19  Vision Screening: Recommended annual ophthalmology exams for early detection of glaucoma and other disorders of the eye. Is the patient up to date with their annual eye exam?  Yes  Who is the provider or what is the name of the office in which the patient attends annual eye exams? Dr Herbert Deaner  If pt is not established with a provider, would they like to be referred to a provider to establish care? No .   Dental Screening: Recommended annual dental exams for proper oral hygiene  Community Resource Referral / Chronic Care Management: CRR required this visit?  No   CCM required this visit?  No      Plan:     I have personally reviewed and noted the following in the patient's chart:   . Medical and social history . Use of alcohol, tobacco or illicit drugs  . Current medications and supplements including opioid prescriptions.  . Functional ability and status . Nutritional status . Physical activity . Advanced directives . List of other physicians . Hospitalizations, surgeries, and ER visits in previous 12 months . Vitals . Screenings to include cognitive, depression, and falls . Referrals and appointments  In addition, I have reviewed and discussed with patient certain preventive protocols, quality metrics, and best  practice recommendations. A written personalized care plan for preventive services as well as general preventive health recommendations were provided to patient.     [name], LPN   [id number]/[id number]   Nurse Notes: None

## 2021-05-02 ENCOUNTER — Telehealth: Payer: Self-pay | Admitting: Family Medicine

## 2021-05-02 NOTE — Progress Notes (Signed)
  Chronic Care Management   Outreach Note  05/02/2021 Name: Cassandra Johnston MRN: [id number] DOB: 07/26/1951  Referred by: Leamon Arnt, MD Reason for referral : No chief complaint on file.   A second unsuccessful telephone outreach was attempted today. The patient was referred to pharmacist for assistance with care management and care coordination.  Follow Up Plan:   Lauretta Grill Upstream Scheduler

## 2021-05-09 ENCOUNTER — Encounter: Payer: Medicare Other | Admitting: Family Medicine

## 2021-05-22 ENCOUNTER — Other Ambulatory Visit: Payer: Medicare Other

## 2021-06-09 ENCOUNTER — Encounter: Payer: Self-pay | Admitting: Family Medicine

## 2021-06-09 ENCOUNTER — Other Ambulatory Visit: Payer: Self-pay | Admitting: Family Medicine

## 2021-06-09 MED ORDER — ACETAMINOPHEN-CODEINE #3 300-30 MG PO TABS: 1.0000 | ORAL_TABLET | ORAL | 0 refills | Status: DC | PRN

## 2021-06-19 ENCOUNTER — Telehealth: Payer: Self-pay | Admitting: Family Medicine

## 2021-06-19 NOTE — Chronic Care Management (AMB) (Signed)
  Chronic Care Management   Outreach Note  06/19/2021 Name: Cassandra Johnston MRN: [id number] DOB: [birth year]  Referred by: Leamon Arnt, MD Reason for referral : No chief complaint on file.   Third unsuccessful telephone outreach was attempted today. The patient was referred to the pharmacist for assistance with care management and care coordination.   Follow Up Plan:   Lauretta Grill Upstream Scheduler

## 2021-06-20 ENCOUNTER — Ambulatory Visit (INDEPENDENT_AMBULATORY_CARE_PROVIDER_SITE_OTHER): Payer: Medicare Other | Admitting: Family Medicine

## 2021-06-20 ENCOUNTER — Encounter: Payer: Self-pay | Admitting: Family Medicine

## 2021-06-20 ENCOUNTER — Other Ambulatory Visit: Payer: Self-pay

## 2021-06-20 VITALS — BP 130/80 | HR 83 | Temp 97.2°F | Ht 64.0 in | Wt 172.0 lb

## 2021-06-20 DIAGNOSIS — M810 Age-related osteoporosis without current pathological fracture: Secondary | ICD-10-CM

## 2021-06-20 DIAGNOSIS — F339 Major depressive disorder, recurrent, unspecified: Secondary | ICD-10-CM

## 2021-06-20 DIAGNOSIS — E782 Mixed hyperlipidemia: Secondary | ICD-10-CM | POA: Diagnosis not present

## 2021-06-20 DIAGNOSIS — G250 Essential tremor: Secondary | ICD-10-CM | POA: Diagnosis not present

## 2021-06-20 DIAGNOSIS — M797 Fibromyalgia: Secondary | ICD-10-CM

## 2021-06-20 DIAGNOSIS — E118 Type 2 diabetes mellitus with unspecified complications: Secondary | ICD-10-CM | POA: Diagnosis not present

## 2021-06-20 DIAGNOSIS — E1169 Type 2 diabetes mellitus with other specified complication: Secondary | ICD-10-CM

## 2021-06-20 DIAGNOSIS — G63 Polyneuropathy in diseases classified elsewhere: Secondary | ICD-10-CM | POA: Diagnosis not present

## 2021-06-20 DIAGNOSIS — I10 Essential (primary) hypertension: Secondary | ICD-10-CM

## 2021-06-20 DIAGNOSIS — Z794 Long term (current) use of insulin: Secondary | ICD-10-CM

## 2021-06-20 DIAGNOSIS — Z0000 Encounter for general adult medical examination without abnormal findings: Secondary | ICD-10-CM | POA: Diagnosis not present

## 2021-06-20 DIAGNOSIS — D72820 Lymphocytosis (symptomatic): Secondary | ICD-10-CM | POA: Diagnosis not present

## 2021-06-20 DIAGNOSIS — F411 Generalized anxiety disorder: Secondary | ICD-10-CM

## 2021-06-20 LAB — CBC WITH DIFFERENTIAL/PLATELET
Basophils Absolute: 0.3 10*3/uL — ABNORMAL HIGH (ref 0.0–0.1)
Basophils Relative: 2.8 % (ref 0.0–3.0)
Eosinophils Absolute: 0.5 10*3/uL (ref 0.0–0.7)
Eosinophils Relative: 4.2 % (ref 0.0–5.0)
HCT: 42.2 % (ref 36.0–46.0)
Hemoglobin: 13.6 g/dL (ref 12.0–15.0)
Lymphocytes Relative: 39.1 % (ref 12.0–46.0)
Lymphs Abs: 4.3 10*3/uL — ABNORMAL HIGH (ref 0.7–4.0)
MCHC: 32.3 g/dL (ref 30.0–36.0)
MCV: 89.4 fl (ref 78.0–100.0)
Monocytes Absolute: 1 10*3/uL (ref 0.1–1.0)
Monocytes Relative: 8.8 % (ref 3.0–12.0)
Neutro Abs: 4.9 10*3/uL (ref 1.4–7.7)
Neutrophils Relative %: 45.1 % (ref 43.0–77.0)
Platelets: 242 10*3/uL (ref 150.0–400.0)
RBC: 4.72 Mil/uL (ref 3.87–5.11)
RDW: 15.1 % (ref 11.5–15.5)
WBC: 10.9 10*3/uL — ABNORMAL HIGH (ref 4.0–10.5)

## 2021-06-20 LAB — COMPREHENSIVE METABOLIC PANEL
ALT: 18 U/L (ref 0–35)
AST: 20 U/L (ref 0–37)
Albumin: 4.2 g/dL (ref 3.5–5.2)
Alkaline Phosphatase: 118 U/L — ABNORMAL HIGH (ref 39–117)
BUN: 11 mg/dL (ref 6–23)
CO2: 31 mEq/L (ref 19–32)
Calcium: 9.8 mg/dL (ref 8.4–10.5)
Chloride: 101 mEq/L (ref 96–112)
Creatinine, Ser: 0.8 mg/dL (ref 0.40–1.20)
GFR: 74.8 mL/min (ref >60.00)
Glucose, Bld: 97 mg/dL (ref 70–99)
Potassium: 4.4 mEq/L (ref 3.5–5.1)
Sodium: 140 mEq/L (ref 135–145)
Total Bilirubin: 0.6 mg/dL (ref 0.2–1.2)
Total Protein: 7 g/dL (ref 6.0–8.3)

## 2021-06-20 LAB — LIPID PANEL
Cholesterol: 129 mg/dL (ref 0–200)
HDL: 64.5 mg/dL (ref >39.00)
LDL Cholesterol: 48 mg/dL (ref 0–99)
NonHDL: 64.56
Total CHOL/HDL Ratio: 2
Triglycerides: 85 mg/dL (ref 0.0–149.0)
VLDL: 17 mg/dL (ref 0.0–40.0)

## 2021-06-20 LAB — TSH: TSH: 0.88 u[IU]/mL (ref 0.35–5.50)

## 2021-06-20 LAB — HEMOGLOBIN A1C: Hgb A1c MFr Bld: 7.5 % — ABNORMAL HIGH (ref 4.6–6.5)

## 2021-06-20 MED ORDER — SHINGRIX 50 MCG/0.5ML IM SUSR: 0.5000 mL | Freq: Once | INTRAMUSCULAR | 0 refills | Status: AC

## 2021-06-20 MED ORDER — BUSPIRONE HCL 7.5 MG PO TABS: ORAL_TABLET | ORAL | 3 refills | Status: DC

## 2021-06-20 NOTE — Patient Instructions (Addendum)
Please return in 6 months for hypertension follow up and recheck  I will release your lab results to you on your MyChart account with further instructions. Please reply with any questions.    Please take the prescription for Shingrix to the pharmacy so they may administer the vaccinations. Your insurance will then cover the injections.   Please schedule your Dexa scan.   If you have any questions or concerns, please don't hesitate to send me a message via MyChart or call the office at [phone]. Thank you for visiting with [place] today! It's our pleasure caring for you.

## 2021-06-22 NOTE — Progress Notes (Signed)
Subjective  Chief Complaint  Patient presents with   Annual Exam    Patient none fasting today     HPI: Cassandra Johnston is a 70 y.o. female who presents to [facility] at [place] today for a Female Wellness Visit. She also has the concerns and/or needs as listed above in the chief complaint. These will be addressed in addition to the Health Maintenance Visit.   Wellness Visit: annual visit with health maintenance review and exam without Pap  HM: due for dexa; she has had to reschedule. Eye exam upcoming in august. Mammo is up to date. Drank coffee with creamer this am. Eligible for shingrix. Chronic disease f/u and/or acute problem visit: (deemed necessary to be done in addition to the wellness visit): Diabetes follow up: Her diabetic control is reported as Unchanged.  She denies exertional CP or SOB or symptomatic hypoglycemia. She denies foot sores or paresthesias. Sees endocrinology for management . HLD and HTN: on ace and statin. Due for recheck. Feeling well. Taking medications w/o adverse effects. No symptoms of CHF, angina; no palpitations, sob, cp or lower extremity edema. Compliant with meds.  Mood: anxiety is well controlled. Needs buspar refilled.  Fibro is stable.  Tremor: not yet bothersome.  Osteoporosis. Needs dexa  Reviewed endocinre records.   Immunization History  Administered Date(s) Administered   Fluad Quad(high Dose 65+) 09/07/2019   Influenza-Unspecified 07/29/2020   PFIZER(Purple Top)SARS-COV-2 Vaccination 12/11/2019, 01/03/2020, 11/12/2020   Pneumococcal Conjugate-13 11/06/2020   Pneumococcal Polysaccharide-23 09/07/2019    Diabetes Related Lab Review: Lab Results  Component Value Date   HGBA1C 7.5 (H) 06/20/2021   HGBA1C 6.8 (A) 11/06/2020   HGBA1C 9.1 (H) 09/07/2019    No results found for: Derl Barrow Lab Results  Component Value Date   CREATININE 0.80 06/20/2021   BUN 11 06/20/2021   NA 140 06/20/2021   K 4.4  06/20/2021   CL 101 06/20/2021   CO2 31 06/20/2021   Lab Results  Component Value Date   CHOL 129 06/20/2021   CHOL 150 04/09/2020   CHOL 152 09/07/2019   Lab Results  Component Value Date   HDL 64.50 06/20/2021   HDL 66.70 04/09/2020   HDL 69.90 09/07/2019   Lab Results  Component Value Date   LDLCALC 48 06/20/2021   LDLCALC 62 04/09/2020   LDLCALC 53 09/07/2019   Lab Results  Component Value Date   TRIG 85.0 06/20/2021   TRIG 107.0 04/09/2020   TRIG 145.0 09/07/2019   Lab Results  Component Value Date   CHOLHDL 2 06/20/2021   CHOLHDL 2 04/09/2020   CHOLHDL 2 09/07/2019   No results found for: LDLDIRECT The ASCVD Risk score Mikey Bussing DC Jr., et al., 2013) failed to calculate for the following reasons:   The valid total cholesterol range is 130 to 320 mg/dL  BP Readings from Last 3 Encounters:  06/20/21 130/80  01/29/21 (!) 162/64  11/06/20 132/74   Wt Readings from Last 3 Encounters:  06/20/21 172 lb (78 kg)  01/29/21 183 lb (83 kg)  01/15/21 183 lb (83 kg)    Health Maintenance  Topic Date Due   Zoster Vaccines- Shingrix (1 of 2) Never done   DEXA SCAN  03/07/2011   COVID-19 Vaccine (4 - Booster for Pfizer series) 02/10/2021   FOOT EXAM  04/09/2021   OPHTHALMOLOGY EXAM  04/30/2021   TETANUS/TDAP  03/25/2029 (Originally 04/27/1970)   INFLUENZA VACCINE  06/30/2021   HEMOGLOBIN A1C  12/21/2021  MAMMOGRAM  04/10/2022   COLONOSCOPY (Pts 45-59yr Insurance coverage will need to be confirmed)  01/29/2026   Hepatitis C Screening  Completed   PNA vac Low Risk Adult  Completed   HPV VACCINES  Aged Out     Assessment  1. Annual physical exam   2. Essential hypertension   3. Combined hyperlipidemia associated with type 2 diabetes mellitus ([place]   4. Polyneuropathy associated with underlying disease ([place]   5. Fibromyalgia   6. Type 2 diabetes mellitus with complication, with long-term current use of insulin ([place]   7. GAD (generalized anxiety disorder)    8. Persistent lymphocytosis   9. Osteoporosis without current pathological fracture, unspecified osteoporosis type   10. Major depression, recurrent, chronic (HCC) Chronic  11. Essential tremor      Plan  Female Wellness Visit: Age appropriate Health Maintenance and Prevention measures were discussed with patient. Included topics are cancer screening recommendations, ways to keep healthy (see AVS) including dietary and exercise recommendations, regular eye and dental care, use of seat belts, and avoidance of moderate alcohol use and tobacco use.  BMI: discussed patient's BMI and encouraged positive lifestyle modifications to help get to or maintain a target BMI. HM needs and immunizations were addressed and ordered. See below for orders. See HM and immunization section for updates. RX shingrix printed for pt.  Routine labs and screening tests ordered including cmp, cbc and lipids where appropriate. Discussed recommendations regarding Vit D and calcium supplementation (see AVS)  Chronic disease management visit and/or acute problem visit: DM: check a1c. On multpile meds pre endocrine. Due eye exam and scheduled. Imms are up to date HTN and HLD contorlled on ace/hctz and statin GAD/depression are well controlled. Refilled meds Fibro is satble Neuropathy is stable.  Tremor: monitor.   Follow up: No follow-ups on file.  Orders Placed This Encounter  Procedures   CBC with Differential/Platelet   Comprehensive metabolic panel   Hemoglobin A1c   Lipid panel   TSH   Meds ordered this encounter  Medications   busPIRone (BUSPAR) 7.5 MG tablet    Sig: TAKE 1 TABLET(7.5 MG) BY MOUTH TWICE DAILY    Dispense:  180 tablet    Refill:  3   Zoster Vaccine Adjuvanted ([facility] injection    Sig: Inject 0.5 mLs into the muscle once for 1 dose. Please give 2nd dose 2-6 months after first dose    Dispense:  2 each    Refill:  0      Body mass index is 29.52 kg/m. Wt Readings from Last 3  Encounters:  06/20/21 172 lb (78 kg)  01/29/21 183 lb (83 kg)  01/15/21 183 lb (83 kg)     Patient Active Problem List   Diagnosis Date Noted   Osteoporosis 09/07/2019    Priority: High   GAD (generalized anxiety disorder) 09/07/2019    Priority: High   Essential hypertension 09/07/2019    Priority: High   H/O lymph node biopsy - abnl follicles /B cells [id number]   Priority: High    Dr. MJana Hakim at risk for developing CLL.     Peripheral neuropathy 02/03/2017    Priority: High   Combined hyperlipidemia associated with type 2 diabetes mellitus ([place] 09/25/2011    Priority: High   Type 2 diabetes mellitus with complication, with long-term current use of insulin ([place] 04/24/2009    Priority: High   Fibromyalgia 11/30/2006    Priority: High   Major depression, recurrent, chronic ([place] 11/30/1978  Priority: High   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    Priority: Medium    Stable > 5 years; released by Dr. Jana Hakim 10/2019; recheck IF > 10,000 consistently.      Bilateral primary osteoarthritis of knee 09/07/2019    Priority: Medium   Essential tremor 02/03/2017    Priority: Medium   History of breast cancer 03/23/2012    Priority: Medium    > 10 year, routine f/u.      GERD 04/24/2009    Priority: Medium   Irritable bowel syndrome with diarrhea 04/24/2009    Priority: Medium   Colon polyps 04/24/2009    Priority: Medium   Status post left hip replacement 07/17/2016    Priority: Low   History of esophageal stricture 04/24/2009    Priority: Low   SOB (shortness of breath) 09/24/2015    Ongoing for years; Cardiology evaluation with Dr. Oval Linsey: nonspecific changes on EKG; echocardiogram: mild diastolic dysfunction, no wall motion abnormalities; negative myocardial perfusion stress test. 2016     Health Maintenance  Topic Date Due   Zoster Vaccines- Shingrix (1 of 2) Never done   DEXA SCAN  03/07/2011   COVID-19 Vaccine (4 - Booster for Pfizer series)  02/10/2021   FOOT EXAM  04/09/2021   OPHTHALMOLOGY EXAM  04/30/2021   TETANUS/TDAP  03/25/2029 (Originally 04/27/1970)   INFLUENZA VACCINE  06/30/2021   HEMOGLOBIN A1C  12/21/2021   MAMMOGRAM  04/10/2022   COLONOSCOPY (Pts 45-33yr Insurance coverage will need to be confirmed)  01/29/2026   Hepatitis C Screening  Completed   PNA vac Low Risk Adult  Completed   HPV VACCINES  Aged Out   Immunization History  Administered Date(s) Administered   Fluad Quad(high Dose 65+) 09/07/2019   Influenza-Unspecified 07/29/2020   PFIZER(Purple Top)SARS-COV-2 Vaccination 12/11/2019, 01/03/2020, 11/12/2020   Pneumococcal Conjugate-13 11/06/2020   Pneumococcal Polysaccharide-23 09/07/2019   We updated and reviewed the patient's past history in detail and it is documented below. Allergies: Patient is allergic to naproxen and sulfa antibiotics. Past Medical History Patient  has a past medical history of Anxiety, Arthritis, Breast cancer ([place] ([place]), Breast infection, Chest pain ([id number], Complication of anesthesia, Depression, Diabetes mellitus, Diverticulosis, Esophageal stricture, Essential hypertension (09/07/2019), Family history of adverse reaction to anesthesia, GERD (gastroesophageal reflux disease), H/O lymph node biopsy - abnl follicles /B cells ([id number], Heart murmur, History of left breast cancer (01/04/2014), Hypertension, Malignant neoplasm of overlapping sites of left breast in female, estrogen receptor positive ([place] (10/13/2019), Osteoarthritis of left hip (07/17/2016), Osteoporosis (09/07/2019), Palpitations (09/24/2015), Personal history of radiation therapy, Pneumonia, PONV (postoperative nausea and vomiting), Shortness of breath (09/24/2015), and Tremor. Past Surgical History Patient  has a past surgical history that includes Breast lumpectomy (2010); Abdominal hysterectomy; Cholecystectomy; Appendectomy; Tonsillectomy; Tubal ligation; Fracture surgery; Hand surgery; Cystectomy; Total hip  arthroplasty (Left, 07/17/2016); and Colonoscopy (04/30/2009). Family History: Patient family history includes Atrial fibrillation in her father; CAD in her father; Colon cancer in her maternal great-grandmother; Colon cancer (age of onset: 684 in her mother; Diabetes in her mother; Pancreatic cancer in her father. Social History:  Patient  reports that she quit smoking about 17 years ago. Her smoking use included cigarettes. She has never used smokeless tobacco. She reports that she does not drink alcohol and does not use drugs.  Review of Systems: Constitutional: negative for fever or malaise Ophthalmic: negative for photophobia, double vision or loss of vision Cardiovascular: negative for chest pain, dyspnea on exertion, or new LE swelling Respiratory: negative for SOB or  persistent cough Gastrointestinal: negative for abdominal pain, change in bowel habits or melena Genitourinary: negative for dysuria or gross hematuria, no abnormal uterine bleeding or disharge Musculoskeletal: negative for new gait disturbance or muscular weakness Integumentary: negative for new or persistent rashes, no breast lumps Neurological: negative for TIA or stroke symptoms Psychiatric: negative for SI or delusions Allergic/Immunologic: negative for hives  Patient Care Team    Relationship Specialty Notifications Start End  Leamon Arnt, MD PCP - General Family Medicine  09/07/19   Alphonsa Overall, MD Consulting Physician General Surgery  04/05/12   Magrinat, Virgie Dad, MD Consulting Physician Hematology and Oncology  04/05/12   Madelin Rear, MD Consulting Physician Endocrinology  09/07/19   Mcarthur Rossetti, MD Consulting Physician Orthopedic Surgery  09/07/19   Marcial Pacas, MD Consulting Physician Neurology  09/07/19   Wallene Huh, [place] Consulting Physician Podiatry  09/07/19   Skeet Latch, MD Attending Physician Cardiology  09/07/19   Marica Otter, OD Consulting Physician Optometry  03/25/20      Objective  Vitals: BP 130/80   Pulse 83   Temp (!) 97.2 F (36.2 C) (Temporal)   Ht '5\' 4"'$  (1.626 m)   Wt 172 lb (78 kg)   SpO2 100%   BMI 29.52 kg/m  General:  Well developed, well nourished, no acute distress  Psych:  Alert and orientedx3,normal mood and affect HEENT:  Normocephalic, atraumatic, non-icteric sclera,  supple neck without adenopathy, mass or thyromegaly Cardiovascular:  Normal S1, S2, RRR without gallop, rub or murmur Respiratory:  Good breath sounds bilaterally, CTAB with normal respiratory effort Gastrointestinal: normal bowel sounds, soft, non-tender, no noted masses. No HSM MSK: no deformities, contusions. Joints are without erythema or swelling.  Skin:  Warm, no rashes or suspicious lesions noted Neurologic:    Mental status is normal. CN 2-11 are normal. Gross motor and sensory exams are normal tremor present Diabetic Foot Exam: Appearance - no lesions, ulcers or calluses Skin - no sigificant pallor or erythema Monofilament testing - sensitive bilaterally in following locations:  Right - Great toe, medial, central, lateral ball and posterior foot intact  Left - Great toe, medial, central, lateral ball and posterior foot intact Pulses - +2 distally bilaterally  Commons side effects, risks, benefits, and alternatives for medications and treatment plan prescribed today were discussed, and the patient expressed understanding of the given instructions. Patient is instructed to call or message via MyChart if he/she has any questions or concerns regarding our treatment plan. No barriers to understanding were identified. We discussed Red Flag symptoms and signs in detail. Patient expressed understanding regarding what to do in case of urgent or emergency type symptoms.  Medication list was reconciled, printed and provided to the patient in AVS. Patient instructions and summary information was reviewed with the patient as documented in the AVS. This note was prepared  with assistance of Dragon voice recognition software. Occasional wrong-word or sound-a-like substitutions may have occurred due to the inherent limitations of voice recognition software  This visit occurred during the SARS-CoV-2 public health emergency.  Safety protocols were in place, including screening questions prior to the visit, additional usage of staff PPE, and extensive cleaning of exam room while observing appropriate contact time as indicated for disinfecting solutions.

## 2021-06-23 ENCOUNTER — Encounter: Payer: Self-pay | Admitting: Family Medicine

## 2021-07-07 DIAGNOSIS — H524 Presbyopia: Secondary | ICD-10-CM | POA: Diagnosis not present

## 2021-07-07 DIAGNOSIS — E119 Type 2 diabetes mellitus without complications: Secondary | ICD-10-CM | POA: Diagnosis not present

## 2021-07-07 DIAGNOSIS — H35363 Drusen (degenerative) of macula, bilateral: Secondary | ICD-10-CM | POA: Diagnosis not present

## 2021-07-07 DIAGNOSIS — H532 Diplopia: Secondary | ICD-10-CM | POA: Diagnosis not present

## 2021-07-07 DIAGNOSIS — H2513 Age-related nuclear cataract, bilateral: Secondary | ICD-10-CM | POA: Diagnosis not present

## 2021-07-07 LAB — HM DIABETES EYE EXAM

## 2021-07-21 ENCOUNTER — Encounter: Payer: Self-pay | Admitting: Family Medicine

## 2021-07-21 MED ORDER — TIZANIDINE HCL 4 MG PO TABS: 4.0000 mg | ORAL_TABLET | Freq: Two times a day (BID) | ORAL | 3 refills | Status: DC | PRN

## 2021-08-28 DIAGNOSIS — E559 Vitamin D deficiency, unspecified: Secondary | ICD-10-CM | POA: Diagnosis not present

## 2021-08-28 DIAGNOSIS — E1165 Type 2 diabetes mellitus with hyperglycemia: Secondary | ICD-10-CM | POA: Diagnosis not present

## 2021-08-28 DIAGNOSIS — E1169 Type 2 diabetes mellitus with other specified complication: Secondary | ICD-10-CM | POA: Diagnosis not present

## 2021-08-28 DIAGNOSIS — E785 Hyperlipidemia, unspecified: Secondary | ICD-10-CM | POA: Diagnosis not present

## 2021-08-28 DIAGNOSIS — Z853 Personal history of malignant neoplasm of breast: Secondary | ICD-10-CM | POA: Diagnosis not present

## 2021-08-28 DIAGNOSIS — Z794 Long term (current) use of insulin: Secondary | ICD-10-CM | POA: Diagnosis not present

## 2021-08-28 DIAGNOSIS — I10 Essential (primary) hypertension: Secondary | ICD-10-CM | POA: Diagnosis not present

## 2021-09-23 ENCOUNTER — Other Ambulatory Visit: Payer: Self-pay | Admitting: Family Medicine

## 2021-11-04 ENCOUNTER — Encounter: Payer: Self-pay | Admitting: Family Medicine

## 2021-11-06 ENCOUNTER — Telehealth (INDEPENDENT_AMBULATORY_CARE_PROVIDER_SITE_OTHER): Payer: Medicare Other | Admitting: Family Medicine

## 2021-11-06 ENCOUNTER — Encounter: Payer: Self-pay | Admitting: Family Medicine

## 2021-11-06 DIAGNOSIS — R059 Cough, unspecified: Secondary | ICD-10-CM | POA: Diagnosis not present

## 2021-11-06 DIAGNOSIS — R0981 Nasal congestion: Secondary | ICD-10-CM

## 2021-11-06 MED ORDER — AMOXICILLIN-POT CLAVULANATE 875-125 MG PO TABS: 1.0000 | ORAL_TABLET | Freq: Two times a day (BID) | ORAL | 0 refills | Status: DC

## 2021-11-06 MED ORDER — BENZONATATE 100 MG PO CAPS: ORAL_CAPSULE | ORAL | 0 refills | Status: DC

## 2021-11-06 NOTE — Progress Notes (Signed)
Virtual Visit via Video Note  I connected with [place]  on 11/06/21 at  5:00 PM EST by a video enabled telemedicine application and verified that I am speaking with the correct person using two identifiers.  Location patient: home,  Location provider:work or home office Persons participating in the virtual visit: patient, provider  I discussed the limitations of evaluation and management by telemedicine and the availability of in person appointments. The patient expressed understanding and agreed to proceed.   HPI:  Acute telemedicine visit for cough and congestion and flu like illness: -Onset: about 2 weeks ago -Symptoms include: initially flu like with body aches, resp symptoms and diarrhea (diarrhea now resolved) has had persistent cough and sinus congestion and now with R ear pain and thick sinus drainage with blood in it at times, had low grade fever on and off -she had 2 negative covid tests -Denies: CP, SOB, NV, inability to eat/drink/get out of bed, drainage form ear, hearin gloss -Pertinent past medical history:see below -Pertinent medication allergies:  Allergies  Allergen Reactions   Naproxen Other (See Comments)    GI   Sulfa Antibiotics Rash  -COVID-19 vaccine status:  Immunization History  Administered Date(s) Administered   Fluad Quad(high Dose 65+) 09/07/2019   Influenza-Unspecified 07/29/2020   PFIZER(Purple Top)SARS-COV-2 Vaccination 12/11/2019, 01/03/2020, 11/12/2020   Pneumococcal Conjugate-13 11/06/2020   Pneumococcal Polysaccharide-23 09/07/2019     ROS: See pertinent positives and negatives per HPI.  Past Medical History:  Diagnosis Date   Anxiety    Arthritis    Breast cancer ([place]) 2010   left   Breast infection    left breast   Chest pain 69/79/4801   Complication of anesthesia    patient states she becomes combative with anesthesia with some surgeries   Depression    Diabetes mellitus    Diverticulosis    Esophageal stricture     Essential hypertension 09/07/2019   Family history of adverse reaction to anesthesia    father was also combative per patient report    GERD (gastroesophageal reflux disease)    H/O lymph node biopsy - abnl follicles /B cells 65/03/3747   Dr. Jana Hakim: at risk for developing CLL.   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, estrogen receptor positive ([place]) 10/13/2019   Osteoarthritis of left hip 07/17/2016   Osteoporosis 09/07/2019   Palpitations 09/24/2015   Personal history of radiation therapy    Pneumonia    hx of    PONV (postoperative nausea and vomiting)    Shortness of breath 09/24/2015   weight related per patient    Tremor     Past Surgical History:  Procedure Laterality Date   ABDOMINAL HYSTERECTOMY     APPENDECTOMY     BREAST LUMPECTOMY  2010   CHOLECYSTECTOMY     COLONOSCOPY  04/30/2009   [name]   CYSTECTOMY     on thyroid   FRACTURE SURGERY     feet   HAND SURGERY     TONSILLECTOMY     TOTAL HIP ARTHROPLASTY Left 07/17/2016   Procedure: LEFT TOTAL HIP ARTHROPLASTY ANTERIOR APPROACH;  Surgeon: Mcarthur Rossetti, MD;  Location: [facility];  Service: Orthopedics;  Laterality: Left;   TUBAL LIGATION       Current Outpatient Medications:    amoxicillin-clavulanate (AUGMENTIN) 875-125 MG tablet, Take 1 tablet by mouth 2 (two) times daily., Disp: 20 tablet,  Rfl: 0   benazepril-hydrochlorthiazide (LOTENSIN HCT) 10-12.5 MG tablet, Take 1 tablet by mouth daily., Disp: , Rfl:    busPIRone (BUSPAR) 7.5 MG tablet, TAKE 1 TABLET(7.5 MG) BY MOUTH TWICE DAILY (Patient taking differently: daily. TAKE 1 TABLET(7.5 MG) BY MOUTH TWICE DAILY), Disp: 180 tablet, Rfl: 3   DULoxetine (CYMBALTA) 60 MG capsule, TAKE 1 CAPSULE(60 MG) BY MOUTH DAILY, Disp: 90 capsule, Rfl: 3   empagliflozin (JARDIANCE) 25 MG TABS tablet, Take 1 tablet by mouth daily., Disp: , Rfl:    Famotidine (PEPCID PO),  Take 40 mg by mouth 2 (two) times daily., Disp: , Rfl:    Insulin Glargine (BASAGLAR KWIKPEN) 100 UNIT/ML SOPN, Inject 45 Units into the skin daily., Disp: , Rfl:    metFORMIN (GLUCOPHAGE) 500 MG tablet, Take 1,000 mg by mouth 2 (two) times daily., Disp: , Rfl:    ONETOUCH ULTRA test strip, TEST TID, Disp: , Rfl:    rosuvastatin (CRESTOR) 10 MG tablet, TAKE 1 TABLET(10 MG) BY MOUTH AT BEDTIME, Disp: 90 tablet, Rfl: 3   tiZANidine (ZANAFLEX) 4 MG tablet, Take 1 tablet (4 mg total) by mouth 2 (two) times daily as needed for muscle spasms., Disp: 180 tablet, Rfl: 3   TRULICITY 1.5 BP/7.9KF SOPN, Inject 4.5 mg into the skin once a week. Tuesdays, Disp: , Rfl:    venlafaxine XR (EFFEXOR-XR) 150 MG 24 hr capsule, TAKE 1 CAPSULE(150 MG) BY MOUTH DAILY WITH BREAKFAST, Disp: 90 capsule, Rfl: 0  EXAM:  VITALS per patient if applicable:  GENERAL: alert, oriented, appears well and in no acute distress  HEENT: atraumatic, conjunttiva clear, no obvious abnormalities on inspection of external nose and ears  NECK: normal movements of the head and neck  LUNGS: on inspection no signs of respiratory distress, breathing rate appears normal, no obvious gross SOB, gasping or wheezing  CV: no obvious cyanosis  MS: moves all visible extremities without noticeable abnormality  PSYCH/NEURO: pleasant and cooperative, no obvious depression or anxiety, speech and thought processing grossly intact  ASSESSMENT AND PLAN:  Discussed the following assessment and plan:  Nasal sinus congestion  Cough, unspecified type  -we discussed possible serious and likely etiologies, options for evaluation and workup, limitations of telemedicine visit vs in person visit, treatment, treatment risks and precautions. Pt is agreeable to treatment via telemedicine at this moment. Query developing sinusitis and possible OM vs other. She has opted to try empiric Augmentin and a cough rx. Advise to seek prompt vv or in person care if  worsening, new symptoms arise, or if is not improving with treatment.    I discussed the assessment and treatment plan with the patient. The patient was provided an opportunity to ask questions and all were answered. The patient agreed with the plan and demonstrated an understanding of the instructions.     Lucretia Kern, DO

## 2021-11-06 NOTE — Telephone Encounter (Signed)
Patient is scheduled for appt with Dr. Maudie Mercury for today.

## 2021-11-06 NOTE — Patient Instructions (Signed)
-  I sent the medication(s) we discussed to your pharmacy: Meds ordered this encounter  Medications   amoxicillin-clavulanate (AUGMENTIN) 875-125 MG tablet    Sig: Take 1 tablet by mouth 2 (two) times daily.    Dispense:  20 tablet    Refill:  0   benzonatate (TESSALON PERLES) 100 MG capsule    Sig: 1-2 capsules up to twice daily as needed for cough as needed.    Dispense:  30 capsule    Refill:  0     I hope you are feeling better soon!  Seek in person care promptly if your symptoms worsen, new concerns arise or you are not improving with treatment.  It was nice to meet you today. I help [place] out with telemedicine visits on Tuesdays and Thursdays and am happy to help if you need a virtual follow up visit on those days. Otherwise, if you have any concerns or questions following this visit please schedule a follow up visit with your Primary Care office or seek care at a local urgent care clinic to avoid delays in care

## 2021-12-04 DIAGNOSIS — E559 Vitamin D deficiency, unspecified: Secondary | ICD-10-CM | POA: Diagnosis not present

## 2021-12-04 DIAGNOSIS — E1165 Type 2 diabetes mellitus with hyperglycemia: Secondary | ICD-10-CM | POA: Diagnosis not present

## 2021-12-04 DIAGNOSIS — E785 Hyperlipidemia, unspecified: Secondary | ICD-10-CM | POA: Diagnosis not present

## 2021-12-11 DIAGNOSIS — E1165 Type 2 diabetes mellitus with hyperglycemia: Secondary | ICD-10-CM | POA: Diagnosis not present

## 2021-12-11 DIAGNOSIS — R748 Abnormal levels of other serum enzymes: Secondary | ICD-10-CM | POA: Diagnosis not present

## 2021-12-11 DIAGNOSIS — I10 Essential (primary) hypertension: Secondary | ICD-10-CM | POA: Diagnosis not present

## 2021-12-11 DIAGNOSIS — Z853 Personal history of malignant neoplasm of breast: Secondary | ICD-10-CM | POA: Diagnosis not present

## 2021-12-11 DIAGNOSIS — E785 Hyperlipidemia, unspecified: Secondary | ICD-10-CM | POA: Diagnosis not present

## 2021-12-11 DIAGNOSIS — Z6830 Body mass index (BMI) 30.0-30.9, adult: Secondary | ICD-10-CM | POA: Diagnosis not present

## 2021-12-11 DIAGNOSIS — E1169 Type 2 diabetes mellitus with other specified complication: Secondary | ICD-10-CM | POA: Diagnosis not present

## 2021-12-11 DIAGNOSIS — M810 Age-related osteoporosis without current pathological fracture: Secondary | ICD-10-CM | POA: Diagnosis not present

## 2021-12-11 DIAGNOSIS — Z794 Long term (current) use of insulin: Secondary | ICD-10-CM | POA: Diagnosis not present

## 2021-12-11 DIAGNOSIS — Z13820 Encounter for screening for osteoporosis: Secondary | ICD-10-CM | POA: Diagnosis not present

## 2021-12-14 LAB — HEMOGLOBIN A1C: Hemoglobin A1C: 7.6

## 2021-12-24 DIAGNOSIS — M810 Age-related osteoporosis without current pathological fracture: Secondary | ICD-10-CM | POA: Diagnosis not present

## 2021-12-24 DIAGNOSIS — I10 Essential (primary) hypertension: Secondary | ICD-10-CM | POA: Diagnosis not present

## 2021-12-24 DIAGNOSIS — Z794 Long term (current) use of insulin: Secondary | ICD-10-CM | POA: Diagnosis not present

## 2021-12-24 DIAGNOSIS — E785 Hyperlipidemia, unspecified: Secondary | ICD-10-CM | POA: Diagnosis not present

## 2021-12-24 DIAGNOSIS — E1169 Type 2 diabetes mellitus with other specified complication: Secondary | ICD-10-CM | POA: Diagnosis not present

## 2021-12-24 DIAGNOSIS — Z8639 Personal history of other endocrine, nutritional and metabolic disease: Secondary | ICD-10-CM | POA: Diagnosis not present

## 2021-12-24 DIAGNOSIS — E1165 Type 2 diabetes mellitus with hyperglycemia: Secondary | ICD-10-CM | POA: Diagnosis not present

## 2021-12-24 DIAGNOSIS — Z853 Personal history of malignant neoplasm of breast: Secondary | ICD-10-CM | POA: Diagnosis not present

## 2021-12-24 DIAGNOSIS — R748 Abnormal levels of other serum enzymes: Secondary | ICD-10-CM | POA: Diagnosis not present

## 2021-12-24 DIAGNOSIS — Z6830 Body mass index (BMI) 30.0-30.9, adult: Secondary | ICD-10-CM | POA: Diagnosis not present

## 2021-12-26 ENCOUNTER — Other Ambulatory Visit: Payer: Self-pay | Admitting: Family Medicine

## 2021-12-26 ENCOUNTER — Other Ambulatory Visit: Payer: Self-pay

## 2021-12-26 MED ORDER — ROSUVASTATIN CALCIUM 10 MG PO TABS: ORAL_TABLET | ORAL | 3 refills | Status: DC

## 2021-12-26 MED ORDER — DULOXETINE HCL 60 MG PO CPEP: ORAL_CAPSULE | ORAL | 3 refills | Status: DC

## 2021-12-26 MED ORDER — VENLAFAXINE HCL ER 150 MG PO CP24: ORAL_CAPSULE | ORAL | 3 refills | Status: DC

## 2022-02-09 ENCOUNTER — Other Ambulatory Visit (HOSPITAL_BASED_OUTPATIENT_CLINIC_OR_DEPARTMENT_OTHER): Payer: Self-pay | Admitting: Orthopaedic Surgery

## 2022-02-09 ENCOUNTER — Ambulatory Visit (HOSPITAL_BASED_OUTPATIENT_CLINIC_OR_DEPARTMENT_OTHER): Payer: Medicare Other | Admitting: Orthopaedic Surgery

## 2022-02-09 DIAGNOSIS — M25559 Pain in unspecified hip: Secondary | ICD-10-CM

## 2022-02-19 ENCOUNTER — Other Ambulatory Visit: Payer: Self-pay | Admitting: Family Medicine

## 2022-02-25 ENCOUNTER — Ambulatory Visit: Payer: Medicare Other | Admitting: Orthopaedic Surgery

## 2022-04-01 DIAGNOSIS — H5213 Myopia, bilateral: Secondary | ICD-10-CM | POA: Diagnosis not present

## 2022-04-01 DIAGNOSIS — H532 Diplopia: Secondary | ICD-10-CM | POA: Diagnosis not present

## 2022-04-01 DIAGNOSIS — H5000 Unspecified esotropia: Secondary | ICD-10-CM | POA: Diagnosis not present

## 2022-04-01 DIAGNOSIS — H524 Presbyopia: Secondary | ICD-10-CM | POA: Diagnosis not present

## 2022-04-01 DIAGNOSIS — H52223 Regular astigmatism, bilateral: Secondary | ICD-10-CM | POA: Diagnosis not present

## 2022-04-04 ENCOUNTER — Encounter: Payer: Self-pay | Admitting: Family Medicine

## 2022-04-06 DIAGNOSIS — M810 Age-related osteoporosis without current pathological fracture: Secondary | ICD-10-CM | POA: Diagnosis not present

## 2022-04-06 DIAGNOSIS — Z8639 Personal history of other endocrine, nutritional and metabolic disease: Secondary | ICD-10-CM | POA: Diagnosis not present

## 2022-04-08 ENCOUNTER — Encounter: Payer: Self-pay | Admitting: Family Medicine

## 2022-04-08 ENCOUNTER — Ambulatory Visit (INDEPENDENT_AMBULATORY_CARE_PROVIDER_SITE_OTHER): Payer: HMO | Admitting: Family Medicine

## 2022-04-08 VITALS — BP 126/68 | HR 68 | Temp 97.3°F | Ht 64.0 in | Wt 176.0 lb

## 2022-04-08 DIAGNOSIS — E118 Type 2 diabetes mellitus with unspecified complications: Secondary | ICD-10-CM

## 2022-04-08 DIAGNOSIS — M546 Pain in thoracic spine: Secondary | ICD-10-CM | POA: Diagnosis not present

## 2022-04-08 DIAGNOSIS — M797 Fibromyalgia: Secondary | ICD-10-CM | POA: Diagnosis not present

## 2022-04-08 DIAGNOSIS — Z794 Long term (current) use of insulin: Secondary | ICD-10-CM | POA: Diagnosis not present

## 2022-04-08 MED ORDER — DICLOFENAC SODIUM 75 MG PO TBEC: 75.0000 mg | DELAYED_RELEASE_TABLET | Freq: Two times a day (BID) | ORAL | 0 refills | Status: DC

## 2022-04-08 NOTE — Progress Notes (Signed)
? ?Subjective  ?CC:  ?Chief Complaint  ?Patient presents with  ? back spasma  ?  Pt stated that she has been having spasms on/off for a yr but this last one was for about 2 weeks. She takes OTC tylenol and motrim  ? ? ?HPI: Cassandra Johnston is a 71 y.o. female who presents to the office today to address the problems listed above in the chief complaint. ?71 year old with multiple chronic medical problems including fibromyalgia complains of 2-week history of upper back pain.  Started spontaneously.  Gets sharp pain across her shoulder blades and catches when she is standing up from a bent forward position.  She has no radicular symptoms.  No injury or overuse.  Has had low back pain in the past but never upper back pain.  No neck pain.  No shortness of breath or shortness chest pain.  She is use Tylenol and Advil with some relief.  She is also taking tizanidine at night.  She has used ice and heat.  Pain is mild to moderate. ?Diabetes managed per endocrine.  Sees endocrine tomorrow.  Last A1c in January was 7.6. ?Assessment  ?1. Acute bilateral thoracic back pain   ?2. Type 2 diabetes mellitus with complication, with long-term current use of insulin ([place])   ?3. Fibromyalgia   ? ?  ?Plan  ?Upper thoracic back pain, rhomboid: Musculoskeletal by exam and history.  Trial of Voltaren twice daily, tizanidine at night, heat massage and stretching.  Exercises were demonstrated in the room.  She will follow-up with me in 2 weeks if symptoms persist.  Recommend further work-up with x-rays and physical therapy at that time.  Patient stands agrees with care plan. ?Type 2 diabetes: Fairly well controlled.  To follow-up with endocrine tomorrow. ? ?Follow up: Due for complete physical in July. ?04/17/2022 ? ?No orders of the defined types were placed in this encounter. ? ?Meds ordered this encounter  ?Medications  ? diclofenac (VOLTAREN) 75 MG EC tablet  ?  Sig: Take 1 tablet (75 mg total) by mouth 2 (two) times daily.  ?   Dispense:  30 tablet  ?  Refill:  0  ? ?  ? ?I reviewed the patients updated PMH, FH, and SocHx.  ?  ?Patient Active Problem List  ? Diagnosis Date Noted  ? Osteoporosis 09/07/2019  ?  Priority: High  ? GAD (generalized anxiety disorder) 09/07/2019  ?  Priority: High  ? Essential hypertension 09/07/2019  ?  Priority: High  ? H/O lymph node biopsy - abnl follicles /B cells [id number]/5916  ?  Priority: High  ? Peripheral neuropathy 02/03/2017  ?  Priority: High  ? Combined hyperlipidemia associated with type 2 diabetes mellitus ([place]) 09/25/2011  ?  Priority: High  ? Type 2 diabetes mellitus with complication, with long-term current use of insulin ([place]) 04/24/2009  ?  Priority: High  ? Fibromyalgia 11/30/2006  ?  Priority: High  ? Major depression, recurrent, chronic ([place]) 11/30/1978  ?  Priority: High  ? 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  Priority: Medium   ? Bilateral primary osteoarthritis of knee 09/07/2019  ?  Priority: Medium   ? Essential tremor 02/03/2017  ?  Priority: Medium   ? History of breast cancer 03/23/2012  ?  Priority: Medium   ? GERD 04/24/2009  ?  Priority: Medium   ? Irritable bowel syndrome with diarrhea 04/24/2009  ?  Priority: Medium   ? Colon polyps 04/24/2009  ?  Priority: Medium   ?  Status post left hip replacement 07/17/2016  ?  Priority: Low  ? History of esophageal stricture 04/24/2009  ?  Priority: Low  ? SOB (shortness of breath) 09/24/2015  ? ?Current Meds  ?Medication Sig  ? benazepril-hydrochlorthiazide (LOTENSIN HCT) 10-12.5 MG tablet Take 1 tablet by mouth daily.  ? busPIRone (BUSPAR) 7.5 MG tablet TAKE 1 TABLET(7.5 MG) BY MOUTH TWICE DAILY (Patient taking differently: daily. TAKE 1 TABLET(7.5 MG) BY MOUTH TWICE DAILY)  ? diclofenac (VOLTAREN) 75 MG EC tablet Take 1 tablet (75 mg total) by mouth 2 (two) times daily.  ? DULoxetine (CYMBALTA) 60 MG capsule TAKE 1 CAPSULE(60 MG) BY MOUTH DAILY  ? empagliflozin (JARDIANCE) 25 MG TABS tablet Take 1 tablet by mouth daily.  ? Famotidine  (PEPCID PO) Take 40 mg by mouth 2 (two) times daily.  ? Insulin Glargine (BASAGLAR KWIKPEN) 100 UNIT/ML SOPN Inject 45 Units into the skin daily.  ? metFORMIN (GLUCOPHAGE) 500 MG tablet Take 1,000 mg by mouth 2 (two) times daily.  ? ONETOUCH ULTRA test strip TEST TID  ? rosuvastatin (CRESTOR) 10 MG tablet TAKE 1 TABLET(10 MG) BY MOUTH AT BEDTIME  ? rosuvastatin (CRESTOR) 10 MG tablet TAKE 1 TABLET(10 MG) BY MOUTH AT BEDTIME  ? tiZANidine (ZANAFLEX) 4 MG tablet Take 1 tablet (4 mg total) by mouth 2 (two) times daily as needed for muscle spasms.  ? TRULICITY 1.5 HY/0.7PX SOPN Inject 4.5 mg into the skin once a week. Tuesdays  ? venlafaxine XR (EFFEXOR-XR) 150 MG 24 hr capsule TAKE 1 CAPSULE(150 MG) BY MOUTH DAILY WITH BREAKFAST  ? [DISCONTINUED] benzonatate (TESSALON PERLES) 100 MG capsule 1-2 capsules up to twice daily as needed for cough as needed.  ? [DISCONTINUED] DULoxetine (CYMBALTA) 60 MG capsule TAKE 1 CAPSULE(60 MG) BY MOUTH DAILY  ? [DISCONTINUED] venlafaxine XR (EFFEXOR-XR) 150 MG 24 hr capsule TAKE 1 CAPSULE(150 MG) BY MOUTH DAILY WITH BREAKFAST  ? ? ?Allergies: ?Patient is allergic to naproxen and sulfa antibiotics. ?Family History: ?Patient family history includes Atrial fibrillation in her father; CAD in her father; Colon cancer in her maternal great-grandmother; Colon cancer (age of onset: 39) in her mother; Diabetes in her mother; Pancreatic cancer in her father. ?Social History:  ?Patient  reports that she quit smoking about 18 years ago. Her smoking use included cigarettes. She has never used smokeless tobacco. She reports that she does not drink alcohol and does not use drugs. ? ?Review of Systems: ?Constitutional: Negative for fever malaise or anorexia ?Cardiovascular: negative for chest pain ?Respiratory: negative for SOB or persistent cough ?Gastrointestinal: negative for abdominal pain ? ?Objective  ?Vitals: BP 126/68   Pulse 68   Temp (!) 97.3 ?F (36.3 ?C)   Ht '5\' 4"'$  (1.626 m)   Wt 176 lb  (79.8 kg)   SpO2 96%   BMI 30.21 kg/m?  ?General: no acute distress , A&Ox3 ?Moves cautiously ? ?Cardiovascular:  RRR without murmur or gallop.  ?Respiratory:  Good breath sounds bilaterally, CTAB with normal respiratory effort ?Back: No spinal tenderness: Tender bilateral rhomboids.  Normal shoulder exam bilaterally.  Full range of motion lower back.  Mild pain with full extension ? ? ? ?Commons side effects, risks, benefits, and alternatives for medications and treatment plan prescribed today were discussed, and the patient expressed understanding of the given instructions. Patient is instructed to call or message via MyChart if he/she has any questions or concerns regarding our treatment plan. No barriers to understanding were identified. We discussed Red Flag symptoms and signs  in detail. Patient expressed understanding regarding what to do in case of urgent or emergency type symptoms.  ?Medication list was reconciled, printed and provided to the patient in AVS. Patient instructions and summary information was reviewed with the patient as documented in the AVS. ?This note was prepared with assistance of Systems analyst. Occasional wrong-word or sound-a-like substitutions may have occurred due to the inherent limitations of voice recognition software ? ?This visit occurred during the SARS-CoV-2 public health emergency.  Safety protocols were in place, including screening questions prior to the visit, additional usage of staff PPE, and extensive cleaning of exam room while observing appropriate contact time as indicated for disinfecting solutions.  ? ?

## 2022-04-08 NOTE — Patient Instructions (Signed)
Please return in July or August for your complete physical. Please come fasting.  ? ?Please take the voltaren twice daily with food for 1-2 weeks. Let me know if your symptoms persist.  ?Stretch as we discussed.  ? ?If you have any questions or concerns, please don't hesitate to send me a message via MyChart or call the office at [phone]. Thank you for visiting with [place] today! It's our pleasure caring for you.  ?

## 2022-04-09 DIAGNOSIS — E785 Hyperlipidemia, unspecified: Secondary | ICD-10-CM | POA: Diagnosis not present

## 2022-04-09 DIAGNOSIS — Z853 Personal history of malignant neoplasm of breast: Secondary | ICD-10-CM | POA: Diagnosis not present

## 2022-04-09 DIAGNOSIS — M810 Age-related osteoporosis without current pathological fracture: Secondary | ICD-10-CM | POA: Diagnosis not present

## 2022-04-09 DIAGNOSIS — Z8639 Personal history of other endocrine, nutritional and metabolic disease: Secondary | ICD-10-CM | POA: Diagnosis not present

## 2022-04-09 DIAGNOSIS — Z6830 Body mass index (BMI) 30.0-30.9, adult: Secondary | ICD-10-CM | POA: Diagnosis not present

## 2022-04-09 DIAGNOSIS — I10 Essential (primary) hypertension: Secondary | ICD-10-CM | POA: Diagnosis not present

## 2022-04-09 DIAGNOSIS — R748 Abnormal levels of other serum enzymes: Secondary | ICD-10-CM | POA: Diagnosis not present

## 2022-04-09 DIAGNOSIS — E1165 Type 2 diabetes mellitus with hyperglycemia: Secondary | ICD-10-CM | POA: Diagnosis not present

## 2022-04-09 DIAGNOSIS — E1169 Type 2 diabetes mellitus with other specified complication: Secondary | ICD-10-CM | POA: Diagnosis not present

## 2022-04-09 DIAGNOSIS — Z794 Long term (current) use of insulin: Secondary | ICD-10-CM | POA: Diagnosis not present

## 2022-04-17 ENCOUNTER — Ambulatory Visit (INDEPENDENT_AMBULATORY_CARE_PROVIDER_SITE_OTHER): Payer: HMO

## 2022-04-17 DIAGNOSIS — Z1231 Encounter for screening mammogram for malignant neoplasm of breast: Secondary | ICD-10-CM | POA: Diagnosis not present

## 2022-04-17 DIAGNOSIS — Z0000 Encounter for general adult medical examination without abnormal findings: Secondary | ICD-10-CM | POA: Diagnosis not present

## 2022-04-17 NOTE — Patient Instructions (Signed)
Cassandra Johnston , Thank you for taking time to come for your Medicare Wellness Visit. I appreciate your ongoing commitment to your health goals. Please review the following plan we discussed and let me know if I can assist you in the future.   Screening recommendations/referrals: Colonoscopy: Done 01/29/21 repeat every 5 years Mammogram: order placed 04/17/22 Bone Density: Done 12/24/21 repeat every 2 years  Recommended yearly ophthalmology/optometry visit for glaucoma screening and checkup Recommended yearly dental visit for hygiene and checkup  Vaccinations: Influenza vaccine: due  Pneumococcal vaccine: Up to date Tdap vaccine: due  Shingles vaccine: 1st dose 12/11/21   Covid-19:Completed 1/11, 2/3, & 11/02/20  Advanced directives: Please bring a copy of your health care power of attorney and living will to the office at your convenience.  Conditions/risks identified: Lose weight and continue exercise   Next appointment: Follow up in one year for your annual wellness visit    Preventive Care 65 Years and Older, Female Preventive care refers to lifestyle choices and visits with your health care provider that can promote health and wellness. What does preventive care include? A yearly physical exam. This is also called an annual well check. Dental exams once or twice a year. Routine eye exams. Ask your health care provider how often you should have your eyes checked. Personal lifestyle choices, including: Daily care of your teeth and gums. Regular physical activity. Eating a healthy diet. Avoiding tobacco and drug use. Limiting alcohol use. Practicing safe sex. Taking low-dose aspirin every day. Taking vitamin and mineral supplements as recommended by your health care provider. What happens during an annual well check? The services and screenings done by your health care provider during your annual well check will depend on your age, overall health, lifestyle risk factors, and family  history of disease. Counseling  Your health care provider may ask you questions about your: Alcohol use. Tobacco use. Drug use. Emotional well-being. Home and relationship well-being. Sexual activity. Eating habits. History of falls. Memory and ability to understand (cognition). Work and work Statistician. Reproductive health. Screening  You may have the following tests or measurements: Height, weight, and BMI. Blood pressure. Lipid and cholesterol levels. These may be checked every 5 years, or more frequently if you are over 41 years old. Skin check. Lung cancer screening. You may have this screening every year starting at age 12 if you have a 30-pack-year history of smoking and currently smoke or have quit within the past 15 years. Fecal occult blood test (FOBT) of the stool. You may have this test every year starting at age 40. Flexible sigmoidoscopy or colonoscopy. You may have a sigmoidoscopy every 5 years or a colonoscopy every 10 years starting at age 50. Hepatitis C blood test. Hepatitis B blood test. Sexually transmitted disease (STD) testing. Diabetes screening. This is done by checking your blood sugar (glucose) after you have not eaten for a while (fasting). You may have this done every 1-3 years. Bone density scan. This is done to screen for osteoporosis. You may have this done starting at age 29. Mammogram. This may be done every 1-2 years. Talk to your health care provider about how often you should have regular mammograms. Talk with your health care provider about your test results, treatment options, and if necessary, the need for more tests. Vaccines  Your health care provider may recommend certain vaccines, such as: Influenza vaccine. This is recommended every year. Tetanus, diphtheria, and acellular pertussis (Tdap, Td) vaccine. You may need a Td booster  every 10 years. Zoster vaccine. You may need this after age 56. Pneumococcal 13-valent conjugate (PCV13)  vaccine. One dose is recommended after age 41. Pneumococcal polysaccharide (PPSV23) vaccine. One dose is recommended after age 23. Talk to your health care provider about which screenings and vaccines you need and how often you need them. This information is not intended to replace advice given to you by your health care provider. Make sure you discuss any questions you have with your health care provider. Document Released: 12/13/2015 Document Revised: 08/05/2016 Document Reviewed: 09/17/2015 Elsevier Interactive Patient Education  2017 [place] Prevention in the Home Falls can cause injuries. They can happen to people of all ages. There are many things you can do to make your home safe and to help prevent falls. What can I do on the outside of my home? Regularly fix the edges of walkways and driveways and fix any cracks. Remove anything that might make you trip as you walk through a door, such as a raised step or threshold. Trim any bushes or trees on the path to your home. Use bright outdoor lighting. Clear any walking paths of anything that might make someone trip, such as rocks or tools. Regularly check to see if handrails are loose or broken. Make sure that both sides of any steps have handrails. Any raised decks and porches should have guardrails on the edges. Have any leaves, snow, or ice cleared regularly. Use sand or salt on walking paths during winter. Clean up any spills in your garage right away. This includes oil or grease spills. What can I do in the bathroom? Use night lights. Install grab bars by the toilet and in the tub and shower. Do not use towel bars as grab bars. Use non-skid mats or decals in the tub or shower. If you need to sit down in the shower, use a plastic, non-slip stool. Keep the floor dry. Clean up any water that spills on the floor as soon as it happens. Remove soap buildup in the tub or shower regularly. Attach bath mats securely with  double-sided non-slip rug tape. Do not have throw rugs and other things on the floor that can make you trip. What can I do in the bedroom? Use night lights. Make sure that you have a light by your bed that is easy to reach. Do not use any sheets or blankets that are too big for your bed. They should not hang down onto the floor. Have a firm chair that has side arms. You can use this for support while you get dressed. Do not have throw rugs and other things on the floor that can make you trip. What can I do in the kitchen? Clean up any spills right away. Avoid walking on wet floors. Keep items that you use a lot in easy-to-reach places. If you need to reach something above you, use a strong step stool that has a grab bar. Keep electrical cords out of the way. Do not use floor polish or wax that makes floors slippery. If you must use wax, use non-skid floor wax. Do not have throw rugs and other things on the floor that can make you trip. What can I do with my stairs? Do not leave any items on the stairs. Make sure that there are handrails on both sides of the stairs and use them. Fix handrails that are broken or loose. Make sure that handrails are as long as the stairways. Check any carpeting to  make sure that it is firmly attached to the stairs. Fix any carpet that is loose or worn. Avoid having throw rugs at the top or bottom of the stairs. If you do have throw rugs, attach them to the floor with carpet tape. Make sure that you have a light switch at the top of the stairs and the bottom of the stairs. If you do not have them, ask someone to add them for you. What else can I do to help prevent falls? Wear shoes that: Do not have high heels. Have rubber bottoms. Are comfortable and fit you well. Are closed at the toe. Do not wear sandals. If you use a stepladder: Make sure that it is fully opened. Do not climb a closed stepladder. Make sure that both sides of the stepladder are locked  into place. Ask someone to hold it for you, if possible. Clearly mark and make sure that you can see: Any grab bars or handrails. First and last steps. Where the edge of each step is. Use tools that help you move around (mobility aids) if they are needed. These include: Canes. Walkers. Scooters. Crutches. Turn on the lights when you go into a dark area. Replace any light bulbs as soon as they burn out. Set up your furniture so you have a clear path. Avoid moving your furniture around. If any of your floors are uneven, fix them. If there are any pets around you, be aware of where they are. Review your medicines with your doctor. Some medicines can make you feel dizzy. This can increase your chance of falling. Ask your doctor what other things that you can do to help prevent falls. This information is not intended to replace advice given to you by your health care provider. Make sure you discuss any questions you have with your health care provider. Document Released: 09/12/2009 Document Revised: 04/23/2016 Document Reviewed: 12/21/2014 Elsevier Interactive Patient Education  2017 [employer].

## 2022-04-17 NOTE — Progress Notes (Signed)
Virtual Visit via Telephone Note  I connected with  Cassandra Johnston on 04/17/22 at  8:45 AM EDT by telephone and verified that I am speaking with the correct person using two identifiers.  Medicare Annual Wellness visit completed telephonically due to Covid-19 pandemic.   Persons participating in this call: This Health Coach and this patient.   Location: Patient: Home Provider: Office   I discussed the limitations, risks, security and privacy concerns of performing an evaluation and management service by telephone and the availability of in person appointments. The patient expressed understanding and agreed to proceed.  Unable to perform video visit due to video visit attempted and failed and/or patient does not have video capability.   Some vital signs may be absent or patient reported.   [name], LPN   Subjective:   Cassandra Johnston is a 71 y.o. female who presents for Medicare Annual (Subsequent) preventive examination.  Review of Systems     Cardiac Risk Factors include: advanced age (>45mn, >>76women);diabetes mellitus;hypertension;dyslipidemia;obesity (BMI >30kg/m2)     Objective:    There were no vitals filed for this visit. There is no height or weight on file to calculate BMI.     04/17/2022    8:52 AM 04/11/2021    8:51 AM 03/25/2020    2:52 PM 07/17/2016    3:45 PM 07/10/2016   10:29 AM  Advanced Directives  Does Patient Have a Medical Advance Directive? Yes Yes Yes Yes Yes  Type of AParamedicof [place]hel[place] will Healthcare Power of Attorney Living will;Healthcare Power of [place] Does patient want to make changes to medical advance directive?   No - Patient declined No - Patient declined No - Patient declined  Copy of [place] Chart? No - copy requested No - copy requested No - copy requested No - copy requested No - copy requested     Current Medications (verified) Outpatient Encounter Medications as of 04/17/2022  Medication Sig   busPIRone (BUSPAR) 7.5 MG tablet TAKE 1 TABLET(7.5 MG) BY MOUTH TWICE DAILY (Patient taking differently: daily. TAKE 1 TABLET(7.5 MG) BY MOUTH TWICE DAILY)   diclofenac (VOLTAREN) 75 MG EC tablet Take 1 tablet (75 mg total) by mouth 2 (two) times daily.   DULoxetine (CYMBALTA) 60 MG capsule TAKE 1 CAPSULE(60 MG) BY MOUTH DAILY   empagliflozin (JARDIANCE) 25 MG TABS tablet Take 1 tablet by mouth daily.   Famotidine (PEPCID PO) Take 40 mg by mouth 2 (two) times daily.   insulin glargine, 1 Unit Dial, (TOUJEO SOLOSTAR) 300 UNIT/ML Solostar Pen 45 units   metFORMIN (GLUCOPHAGE) 500 MG tablet Take 1,000 mg by mouth 2 (two) times daily.   ONETOUCH ULTRA test strip TEST TID   rosuvastatin (CRESTOR) 10 MG tablet TAKE 1 TABLET(10 MG) BY MOUTH AT BEDTIME   Semaglutide,0.25 or 0.'5MG'$ /DOS, (OZEMPIC, 0.25 OR 0.5 MG/DOSE,) 2 MG/3ML SOPN 0.5 mg   tiZANidine (ZANAFLEX) 4 MG tablet Take 1 tablet (4 mg total) by mouth 2 (two) times daily as needed for muscle spasms.   venlafaxine XR (EFFEXOR-XR) 150 MG 24 hr capsule TAKE 1 CAPSULE(150 MG) BY MOUTH DAILY WITH BREAKFAST   [DISCONTINUED] benazepril-hydrochlorthiazide (LOTENSIN HCT) 10-12.5 MG tablet Take 1 tablet by mouth daily.   [DISCONTINUED] Insulin Glargine (BASAGLAR KWIKPEN) 100 UNIT/ML SOPN Inject 45 Units into the skin daily.   [DISCONTINUED] rosuvastatin (CRESTOR) 10 MG tablet TAKE 1 TABLET(10 MG) BY MOUTH AT  BEDTIME   [DISCONTINUED] TRULICITY 1.5 YQ/8.2NO SOPN Inject 4.5 mg into the skin once a week. Tuesdays   No facility-administered encounter medications on file as of 04/17/2022.    Allergies (verified) Naproxen and Sulfa antibiotics   History: Past Medical History:  Diagnosis Date   Anxiety    Arthritis    Breast cancer ([place]) 2010   left   Breast infection    left breast   Chest pain 03/70/4888   Complication of anesthesia    patient  states she becomes combative with anesthesia with some surgeries   Depression    Diabetes mellitus    Diverticulosis    Esophageal stricture    Essential hypertension 09/07/2019   Family history of adverse reaction to anesthesia    father was also combative per patient report    GERD (gastroesophageal reflux disease)    H/O lymph node biopsy - abnl follicles /B cells 91/04/9449   Dr. Jana Hakim: at risk for developing CLL.   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, estrogen receptor positive ([place]) 10/13/2019   Osteoarthritis of left hip 07/17/2016   Osteoporosis 09/07/2019   Palpitations 09/24/2015   Personal history of radiation therapy    Pneumonia    hx of    PONV (postoperative nausea and vomiting)    Shortness of breath 09/24/2015   weight related per patient    Tremor    Past Surgical History:  Procedure Laterality Date   ABDOMINAL HYSTERECTOMY     APPENDECTOMY     BREAST LUMPECTOMY  2010   CHOLECYSTECTOMY     COLONOSCOPY  04/30/2009   [name]   CYSTECTOMY     on thyroid   FRACTURE SURGERY     feet   HAND SURGERY     TONSILLECTOMY     TOTAL HIP ARTHROPLASTY Left 07/17/2016   Procedure: LEFT TOTAL HIP ARTHROPLASTY ANTERIOR APPROACH;  Surgeon: Mcarthur Rossetti, MD;  Location: [facility];  Service: Orthopedics;  Laterality: Left;   TUBAL LIGATION     Family History  Problem Relation Age of Onset   Diabetes Mother    Colon cancer Mother 47   Atrial fibrillation Father    CAD Father    Pancreatic cancer Father    Colon cancer Maternal Great-grandmother    Colon polyps Neg Hx    Esophageal cancer Neg Hx    Rectal cancer Neg Hx    Stomach cancer Neg Hx    Social History   Socioeconomic History   Marital status: Married    Spouse name: Not on file   Number of children: 2   Years of education: Associates   Highest education level: Not on file  Occupational History    Occupation: Retired  Tobacco Use   Smoking status: Former    Types: Cigarettes    Quit date: 12/01/2003    Years since quitting: 18.3   Smokeless tobacco: Never   Tobacco comments:    Quit 10+ years ago  Vaping Use   Vaping Use: Never used  Substance and Sexual Activity   Alcohol use: No    Comment: Quit 25+ years ago.   Drug use: No   Sexual activity: Not on file  Other Topics Concern   Not on file  Social History Narrative   Epworth Sleepiness Scale = 9 (as of 09/24/2015)         Lives  at home with husband and granddaughter.   Right-handed.   1 cup coffee per day.   Social Determinants of Health   Financial Resource Strain: Low Risk    Difficulty of Paying Living Expenses: Not hard at all  Food Insecurity: No Food Insecurity   Worried About Charity fundraiser in the Last Year: Never true   [place] in the Last Year: Never true  Transportation Needs: No Transportation Needs   Lack of Transportation (Medical): No   Lack of Transportation (Non-Medical): No  Physical Activity: Insufficiently Active   Days of Exercise per Week: 3 days   Minutes of Exercise per Session: 30 min  Stress: No Stress Concern Present   Feeling of Stress : Not at all  Social Connections: Moderately Integrated   Frequency of Communication with Friends and Family: More than three times a week   Frequency of Social Gatherings with Friends and Family: Twice a week   Attends Religious Services: More than 4 times per year   Active Member of [employer] or Organizations: No   Attends Music therapist: Never   Marital Status: Married    Tobacco Counseling Counseling given: Not Answered Tobacco comments: Quit 10+ years ago   Clinical Intake:  Pre-visit preparation completed: Yes  Pain : No/denies pain     BMI - recorded: 29.86 Nutritional Status: BMI 25 -29 Overweight Nutritional Risks: None Diabetes: Yes CBG done?: No Did pt. bring in CBG monitor from home?: No  How  often do you need to have someone help you when you read instructions, pamphlets, or other written materials from your doctor or pharmacy?: 1 - Never  Diabetic?Nutrition Risk Assessment:  Has the patient had any N/V/D within the last 2 months?  No  Does the patient have any non-healing wounds?  No  Has the patient had any unintentional weight loss or weight gain?  No   Diabetes:  Is the patient diabetic?  Yes  If diabetic, was a CBG obtained today?  No  Did the patient bring in their glucometer from home?  No  How often do you monitor your CBG's? 2-3 time a week.   Financial Strains and Diabetes Management:  Are you having any financial strains with the device, your supplies or your medication? Yes . Pt stated she is speaking to a program to get aid in this matter  Does the patient want to be seen by Chronic Care Management for management of their diabetes?  No  Would the patient like to be referred to a Nutritionist or for Diabetic Management?  No   Diabetic Exams:   Diabetic Eye Exam: Completed 07/07/21 Diabetic Foot Exam: Overdue, Pt has been advised about the importance in completing this exam. Pt is scheduled for diabetic foot exam on next appt.   Interpreter Needed?: No  Information entered by :: Charlott Rakes, LPN   Activities of Daily Living    04/17/2022    8:54 AM  In your present state of health, do you have any difficulty performing the following activities:  Hearing? 0  Vision? 0  Difficulty concentrating or making decisions? 0  Walking or climbing stairs? 0  Dressing or bathing? 0  Doing errands, shopping? 0  Preparing Food and eating ? N  Using the Toilet? N  In the past six months, have you accidently leaked urine? Y  Comment wears a pad  Do you have problems with loss of bowel control? Y  Comment wears a pad  Managing your Medications? N  Managing your Finances? N  Housekeeping or managing your Housekeeping? N    Patient Care Team: Leamon Arnt, MD as PCP - General (Family Medicine) Alphonsa Overall, MD as Consulting Physician (General Surgery) Magrinat, Virgie Dad, MD (Inactive) as Consulting Physician (Hematology and Oncology) Madelin Rear, MD as Consulting Physician (Endocrinology) Mcarthur Rossetti, MD as Consulting Physician (Orthopedic Surgery) Marcial Pacas, MD as Consulting Physician (Neurology) Paulla Dolly Tamala Fothergill, DPM as Consulting Physician (Podiatry) Skeet Latch, MD as Attending Physician (Cardiology) Marica Otter, OD as Consulting Physician (Optometry)  Indicate any recent Medical Services you may have received from other than [facility] providers in the past year (date may be approximate).     Assessment:   This is a routine wellness examination for [place].  Hearing/Vision screen Hearing Screening - Comments:: Pt denies any hearing issues  Vision Screening - Comments:: Pt follows up with Dr Herbert Deaner for annual eye exams   Dietary issues and exercise activities discussed: Current Exercise Habits: Home exercise routine, Type of exercise: walking, Time (Minutes): 30, Frequency (Times/Week): 3, Weekly Exercise (Minutes/Week): 90   Goals Addressed             This Visit's Progress    Patient Stated       Lose weight        Depression Screen    04/17/2022    8:51 AM 06/20/2021    9:08 AM 04/11/2021    8:49 AM 04/09/2020    8:39 AM 03/25/2020    2:53 PM  PHQ 2/9 Scores  PHQ - 2 Score 1 0 1 2 0  PHQ- 9 Score  0  11     Fall Risk    04/17/2022    8:53 AM 04/08/2022    8:46 AM 06/20/2021    9:08 AM 04/11/2021    8:53 AM 04/09/2020    8:39 AM  Fall Risk   Falls in the past year? 0 0 1 1 0  Number falls in past yr: 0 0 0 1 0  Comment    carrying groceries   Injury with Fall? 0 0 0 0 0  Risk for fall due to : Impaired vision;Impaired balance/gait;Impaired mobility No Fall Risks Impaired balance/gait Impaired vision;Impaired balance/gait   Risk for fall due to: Comment at times      Follow up Falls  prevention discussed Falls evaluation completed  Falls prevention discussed     FALL RISK PREVENTION PERTAINING TO THE HOME:  Any stairs in or around the home? Yes  If so, are there any without handrails? No  Home free of loose throw rugs in walkways, pet beds, electrical cords, etc? Yes  Adequate lighting in your home to reduce risk of falls? Yes   ASSISTIVE DEVICES UTILIZED TO PREVENT FALLS:  Life alert? No  Use of a cane, walker or w/c? No  Grab bars in the bathroom? Yes  Shower chair or bench in shower? Yes  Elevated toilet seat or a handicapped toilet? Yes   TIMED UP AND GO:  Was the test performed? No .  Cognitive Function:        04/17/2022    8:55 AM 04/11/2021    8:56 AM 03/25/2020    2:53 PM  6CIT Screen  What Year? 0 points 0 points 0 points  What month? 0 points 0 points 0 points  What time? 0 points  0 points  Count back from 20 0 points 0 points 0 points  Months in  reverse 0 points 0 points 0 points  Repeat phrase 0 points 0 points 0 points  Total Score 0 points  0 points    Immunizations Immunization History  Administered Date(s) Administered   Fluad Quad(high Dose 65+) 09/07/2019   Influenza-Unspecified 07/29/2020   PFIZER(Purple Top)SARS-COV-2 Vaccination 12/11/2019, 01/03/2020, 11/12/2020   Pneumococcal Conjugate-13 11/06/2020   Pneumococcal Polysaccharide-23 09/07/2019   Zoster Recombinat (Shingrix) 12/11/2021    TDAP status: Due, Education has been provided regarding the importance of this vaccine. Advised may receive this vaccine at local pharmacy or Health Dept. Aware to provide a copy of the vaccination record if obtained from local pharmacy or Health Dept. Verbalized acceptance and understanding.  Flu Vaccine status: Due, Education has been provided regarding the importance of this vaccine. Advised may receive this vaccine at local pharmacy or Health Dept. Aware to provide a copy of the vaccination record if obtained from local pharmacy or  Health Dept. Verbalized acceptance and understanding.  Pneumococcal vaccine status: Up to date  Covid-19 vaccine status: Completed vaccines  Qualifies for Shingles Vaccine? Yes   Zostavax completed Yes   Shingrix Completed?: Yes  Screening Tests Health Maintenance  Topic Date Due   URINE MICROALBUMIN  Never done   FOOT EXAM  04/09/2021   Zoster Vaccines- Shingrix (2 of 2) 02/05/2022   MAMMOGRAM  04/10/2022   COVID-19 Vaccine (4 - Booster for Pfizer series) 04/24/2022 (Originally 01/07/2021)   TETANUS/TDAP  03/25/2029 (Originally 04/27/1970)   HEMOGLOBIN A1C  06/13/2022   INFLUENZA VACCINE  06/30/2022   OPHTHALMOLOGY EXAM  07/07/2022   DEXA SCAN  12/25/2023   COLONOSCOPY (Pts 45-55yr Insurance coverage will need to be confirmed)  01/29/2026   Pneumonia Vaccine 71 Years old  Completed   Hepatitis C Screening  Completed   HPV VACCINES  Aged Out    Health Maintenance  Health Maintenance Due  Topic Date Due   URINE MICROALBUMIN  Never done   FOOT EXAM  04/09/2021   Zoster Vaccines- Shingrix (2 of 2) 02/05/2022   MAMMOGRAM  04/10/2022    Colorectal cancer screening: Type of screening: Colonoscopy. Completed 01/29/21. Repeat every 5 years  Mammogram status: Ordered 04/17/22. Pt provided with contact info and advised to call to schedule appt.   Bone Density status: Completed 12/24/21. Results reflect: Bone density results: OSTEOPOROSIS. Repeat every 2 years.   Additional Screening:  Hepatitis C Screening:  Completed 09/07/19  Vision Screening: Recommended annual ophthalmology exams for early detection of glaucoma and other disorders of the eye. Is the patient up to date with their annual eye exam?  Yes  Who is the provider or what is the name of the office in which the patient attends annual eye exams? Dr HHerbert Deaner If pt is not established with a provider, would they like to be referred to a provider to establish care? No .   Dental Screening: Recommended annual dental exams  for proper oral hygiene  Community Resource Referral / Chronic Care Management: CRR required this visit?  No   CCM required this visit?  No      Plan:     I have personally reviewed and noted the following in the patient's chart:   Medical and social history Use of alcohol, tobacco or illicit drugs  Current medications and supplements including opioid prescriptions.  Functional ability and status Nutritional status Physical activity Advanced directives List of other physicians Hospitalizations, surgeries, and ER visits in previous 12 months Vitals Screenings to include cognitive, depression, and falls Referrals  and appointments  In addition, I have reviewed and discussed with patient certain preventive protocols, quality metrics, and best practice recommendations. A written personalized care plan for preventive services as well as general preventive health recommendations were provided to patient.     [name], LPN   9/37/3428   Nurse Notes: none

## 2022-04-28 DIAGNOSIS — M810 Age-related osteoporosis without current pathological fracture: Secondary | ICD-10-CM | POA: Diagnosis not present

## 2022-05-26 ENCOUNTER — Other Ambulatory Visit: Payer: Self-pay

## 2022-05-26 ENCOUNTER — Encounter: Payer: Self-pay | Admitting: Family Medicine

## 2022-05-27 ENCOUNTER — Other Ambulatory Visit: Payer: Self-pay | Admitting: Family Medicine

## 2022-06-25 ENCOUNTER — Ambulatory Visit (INDEPENDENT_AMBULATORY_CARE_PROVIDER_SITE_OTHER): Payer: HMO | Admitting: Orthopedic Surgery

## 2022-06-25 DIAGNOSIS — L608 Other nail disorders: Secondary | ICD-10-CM

## 2022-06-25 DIAGNOSIS — L03011 Cellulitis of right finger: Secondary | ICD-10-CM

## 2022-06-25 DIAGNOSIS — L03019 Cellulitis of unspecified finger: Secondary | ICD-10-CM | POA: Insufficient documentation

## 2022-06-25 NOTE — Progress Notes (Signed)
Office Visit Note   Patient: Cassandra Johnston           Date of Birth: 04/25/1951           MRN: [id number] Visit Date: 06/25/2022              Requested by: Leamon Arnt, [address],  [place] [address] PCP: Leamon Arnt, MD   Assessment & Plan: Visit Diagnoses:  1. Deformity of nail bed   2. 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  She has some swelling and erythema around the proximal nail fold.  Her nail plate came off around 2 years ago it is not grown back.  She does not seem to have any remnant nail plate growing some proximal nail fold.  There are no open wounds or lesions.  She will occasionally have pain around the proximal nail fold.  She does note that she frequently picks at the area.  We discussed trying topical gentian violet for possible chronic paronychia.  I can see her back in a month or so she remains symptomatic.  Follow-Up Instructions: No follow-ups on file.   Orders:  No orders of the defined types were placed in this encounter.  No orders of the defined types were placed in this encounter.     Procedures: No procedures performed   Clinical Data: No additional findings.   Subjective: Chief Complaint  Patient presents with   Right Middle Finger - Nail Problem    This is a 71 year old right-hand-dominant female presents with an issue with her right middle finger nail.  She notes that her nail came off around 2 years ago.  Initially started with a split in the nail.  She picked off the nail around this time.  The nail has never grown back.  She will have pain on the proximal nail fold.  She notes that there is some mild swelling of around the proximal nail fold.  There is never been any drainage.  She frequently will pick at the area around the proximal nail fold.  She does not have any hard nail plate and is growing at the moment.   She denies any drainage from the proximal nail fold.  She denies any issues with other fingers.    Review of Systems   Objective: Vital Signs: There were no vitals taken for this visit.  Physical Exam Constitutional:      Appearance: Normal appearance.  Cardiovascular:     Rate and Rhythm: Normal rate.     Pulses: Normal pulses.  Pulmonary:     Effort: Pulmonary effort is normal.  Skin:    General: Skin is warm and dry.     Capillary Refill: Capillary refill takes less than 2 seconds.  Neurological:     Mental Status: She is alert.     Right Hand Exam   Tenderness  Right hand tenderness location: Mild TTP around proximal nail fold.  Other  Erythema: present Sensation: normal Pulse: present  Comments:  Mild erythema around proximal nail fold w/ mild associated swelling.  No remnant nail plate fragments growing.  No open wounds, sores, or lesions.       Specialty Comments:  No specialty comments available.  Imaging: No results found.   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   Osteoporosis 09/07/2019   Bilateral primary osteoarthritis of knee 09/07/2019   GAD (generalized anxiety disorder) 09/07/2019   Essential hypertension 09/07/2019   H/O lymph node biopsy - abnl follicles /B cells 62/83/1517   Essential tremor 02/03/2017   Peripheral neuropathy 02/03/2017   Status post left hip replacement 07/17/2016   SOB (shortness of breath) 09/24/2015   History of breast cancer 03/23/2012   Combined hyperlipidemia associated with type 2 diabetes mellitus ([place]) 09/25/2011   Type 2 diabetes mellitus with complication, with long-term current use of insulin ([place]) 04/24/2009   History of esophageal stricture 04/24/2009   GERD 04/24/2009   Irritable bowel syndrome with diarrhea 04/24/2009   Colon polyps 04/24/2009    Fibromyalgia 11/30/2006   Major depression, recurrent, chronic ([place]) 11/30/1978   Past Medical History:  Diagnosis Date   Anxiety    Arthritis    Breast cancer ([place]) 2010   left   Breast infection    left breast   Chest pain 61/60/7371   Complication of anesthesia    patient states she becomes combative with anesthesia with some surgeries   Depression    Diabetes mellitus    Diverticulosis    Esophageal stricture    Essential hypertension 09/07/2019   Family history of adverse reaction to anesthesia    father was also combative per patient report    GERD (gastroesophageal reflux disease)    H/O lymph node biopsy - abnl follicles /B cells 05/02/6947   Dr. Jana Hakim: at risk for developing CLL.   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, estrogen receptor positive ([place]) 10/13/2019   Osteoarthritis of left hip 07/17/2016   Osteoporosis 09/07/2019   Palpitations 09/24/2015   Personal history of radiation therapy    Pneumonia    hx of    PONV (postoperative nausea and vomiting)    Shortness of breath 09/24/2015   weight related per patient    Tremor     Family History  Problem Relation Age of Onset   Diabetes Mother    Colon cancer Mother 3   Atrial fibrillation Father    CAD Father    Pancreatic cancer Father    Colon cancer Maternal Great-grandmother    Colon polyps Neg Hx    Esophageal cancer Neg Hx    Rectal cancer Neg Hx    Stomach cancer Neg Hx     Past Surgical History:  Procedure Laterality Date   ABDOMINAL HYSTERECTOMY     APPENDECTOMY     BREAST LUMPECTOMY  2010   CHOLECYSTECTOMY     COLONOSCOPY  04/30/2009   [name]   CYSTECTOMY     on thyroid   FRACTURE SURGERY     feet   HAND SURGERY     TONSILLECTOMY     TOTAL HIP ARTHROPLASTY Left 07/17/2016   Procedure: LEFT TOTAL HIP ARTHROPLASTY ANTERIOR APPROACH;  Surgeon: Mcarthur Rossetti, MD;  Location: [facility];  Service: Orthopedics;  Laterality: Left;   TUBAL LIGATION     Social History   Occupational History   Occupation: Retired  Tobacco Use   Smoking status: Former    Types: Cigarettes    Quit date: 12/01/2003    Years since quitting: 18.5   Smokeless tobacco: Never   Tobacco comments:    Quit 10+  years ago  Vaping Use   Vaping Use: Never used  Substance and Sexual Activity   Alcohol use: No    Comment: Quit 25+ years ago.   Drug use: No   Sexual activity: Not on file

## 2022-07-01 ENCOUNTER — Telehealth: Payer: Self-pay | Admitting: Orthopedic Surgery

## 2022-07-01 ENCOUNTER — Ambulatory Visit: Payer: HMO | Admitting: Orthopaedic Surgery

## 2022-07-01 NOTE — Telephone Encounter (Signed)
Called pt to get insurance information.

## 2022-07-06 ENCOUNTER — Other Ambulatory Visit: Payer: Self-pay | Admitting: Family Medicine

## 2022-07-10 ENCOUNTER — Encounter: Payer: Self-pay | Admitting: Family Medicine

## 2022-07-13 ENCOUNTER — Ambulatory Visit: Payer: PPO | Admitting: Orthopedic Surgery

## 2022-07-17 MED ORDER — ACETAMINOPHEN-CODEINE 300-30 MG PO TABS: 1.0000 | ORAL_TABLET | ORAL | 0 refills | Status: DC | PRN

## 2022-07-23 ENCOUNTER — Encounter: Payer: Self-pay | Admitting: Family Medicine

## 2022-07-23 ENCOUNTER — Ambulatory Visit (INDEPENDENT_AMBULATORY_CARE_PROVIDER_SITE_OTHER): Payer: HMO | Admitting: Family Medicine

## 2022-07-23 VITALS — BP 158/84 | HR 53 | Temp 97.6°F | Ht 64.0 in | Wt 172.4 lb

## 2022-07-23 DIAGNOSIS — M797 Fibromyalgia: Secondary | ICD-10-CM | POA: Diagnosis not present

## 2022-07-23 DIAGNOSIS — F411 Generalized anxiety disorder: Secondary | ICD-10-CM

## 2022-07-23 DIAGNOSIS — R011 Cardiac murmur, unspecified: Secondary | ICD-10-CM

## 2022-07-23 DIAGNOSIS — Z1231 Encounter for screening mammogram for malignant neoplasm of breast: Secondary | ICD-10-CM

## 2022-07-23 DIAGNOSIS — G63 Polyneuropathy in diseases classified elsewhere: Secondary | ICD-10-CM

## 2022-07-23 DIAGNOSIS — M810 Age-related osteoporosis without current pathological fracture: Secondary | ICD-10-CM

## 2022-07-23 DIAGNOSIS — E1169 Type 2 diabetes mellitus with other specified complication: Secondary | ICD-10-CM | POA: Diagnosis not present

## 2022-07-23 DIAGNOSIS — E782 Mixed hyperlipidemia: Secondary | ICD-10-CM

## 2022-07-23 DIAGNOSIS — E118 Type 2 diabetes mellitus with unspecified complications: Secondary | ICD-10-CM | POA: Diagnosis not present

## 2022-07-23 DIAGNOSIS — I10 Essential (primary) hypertension: Secondary | ICD-10-CM | POA: Diagnosis not present

## 2022-07-23 DIAGNOSIS — F339 Major depressive disorder, recurrent, unspecified: Secondary | ICD-10-CM

## 2022-07-23 DIAGNOSIS — D72820 Lymphocytosis (symptomatic): Secondary | ICD-10-CM

## 2022-07-23 DIAGNOSIS — Z0000 Encounter for general adult medical examination without abnormal findings: Secondary | ICD-10-CM

## 2022-07-23 DIAGNOSIS — Z794 Long term (current) use of insulin: Secondary | ICD-10-CM

## 2022-07-23 LAB — HEMOGLOBIN A1C: Hgb A1c MFr Bld: 9.8 % — ABNORMAL HIGH (ref 4.6–6.5)

## 2022-07-23 LAB — COMPREHENSIVE METABOLIC PANEL
ALT: 34 U/L (ref 0–35)
AST: 40 U/L — ABNORMAL HIGH (ref 0–37)
Albumin: 4.1 g/dL (ref 3.5–5.2)
Alkaline Phosphatase: 163 U/L — ABNORMAL HIGH (ref 39–117)
BUN: 13 mg/dL (ref 6–23)
CO2: 26 mEq/L (ref 19–32)
Calcium: 9.6 mg/dL (ref 8.4–10.5)
Chloride: 102 mEq/L (ref 96–112)
Creatinine, Ser: 0.85 mg/dL (ref 0.40–1.20)
GFR: 69.02 mL/min (ref >60.00)
Glucose, Bld: 141 mg/dL — ABNORMAL HIGH (ref 70–99)
Potassium: 4.7 mEq/L (ref 3.5–5.1)
Sodium: 140 mEq/L (ref 135–145)
Total Bilirubin: 0.6 mg/dL (ref 0.2–1.2)
Total Protein: 7.5 g/dL (ref 6.0–8.3)

## 2022-07-23 LAB — CBC WITH DIFFERENTIAL/PLATELET
Basophils Absolute: 0.3 10*3/uL — ABNORMAL HIGH (ref 0.0–0.1)
Basophils Relative: 2.6 % (ref 0.0–3.0)
Eosinophils Absolute: 0.6 10*3/uL (ref 0.0–0.7)
Eosinophils Relative: 5.7 % — ABNORMAL HIGH (ref 0.0–5.0)
HCT: 40.8 % (ref 36.0–46.0)
Hemoglobin: 13.5 g/dL (ref 12.0–15.0)
Lymphocytes Relative: 38.3 % (ref 12.0–46.0)
Lymphs Abs: 3.9 10*3/uL (ref 0.7–4.0)
MCHC: 33 g/dL (ref 30.0–36.0)
MCV: 89.2 fl (ref 78.0–100.0)
Monocytes Absolute: 1.1 10*3/uL — ABNORMAL HIGH (ref 0.1–1.0)
Monocytes Relative: 11 % (ref 3.0–12.0)
Neutro Abs: 4.3 10*3/uL (ref 1.4–7.7)
Neutrophils Relative %: 42.4 % — ABNORMAL LOW (ref 43.0–77.0)
Platelets: 207 10*3/uL (ref 150.0–400.0)
RBC: 4.57 Mil/uL (ref 3.87–5.11)
RDW: 14.8 % (ref 11.5–15.5)
WBC: 10.2 10*3/uL (ref 4.0–10.5)

## 2022-07-23 LAB — MICROALBUMIN / CREATININE URINE RATIO
Creatinine,U: 59.5 mg/dL
Microalb Creat Ratio: 1.6 mg/g (ref 0.0–30.0)
Microalb, Ur: 0.9 mg/dL (ref 0.0–1.9)

## 2022-07-23 LAB — LIPID PANEL
Cholesterol: 126 mg/dL (ref 0–200)
HDL: 64.2 mg/dL (ref >39.00)
LDL Cholesterol: 41 mg/dL (ref 0–99)
NonHDL: 61.41
Total CHOL/HDL Ratio: 2
Triglycerides: 102 mg/dL (ref 0.0–149.0)
VLDL: 20.4 mg/dL (ref 0.0–40.0)

## 2022-07-23 LAB — TSH: TSH: 2.59 u[IU]/mL (ref 0.35–5.50)

## 2022-07-23 NOTE — Progress Notes (Signed)
Subjective  Chief Complaint  Patient presents with   Annual Exam    Pt here for Annual exam and is     HPI: Cassandra Johnston is a 71 y.o. female who presents to [facility] today for a Female Wellness Visit. She also has the concerns and/or needs as listed above in the chief complaint. These will be addressed in addition to the Health Maintenance Visit.   Wellness Visit: annual visit with health maintenance review and exam without Pap  HM: due mammogram. Other screens are current. Defers flu vaccine till later. Counseling done. Other imms up to date Chronic disease f/u and/or acute problem visit: (deemed necessary to be done in addition to the wellness visit): Diabetes managed by endocrine with insulin, jardiance 25, ozempic; she is not sure what her most recent a1c was. Feels well. No retinopathy by recent eye exam. Has neuropathy; no foot concerns.  HTN is controlled. No cp HLD on statin. Due for recheck has been at goal.  Fibromyalgia: much better on cymbalta 60 GAD on effexor long term and mood is good. Anxiety is controlled.  Reviewed dexa: osteoporosis; had reclast infusion but had significant side effects: malaise and arthralgias.   Assessment  1. Annual physical exam   2. Essential hypertension   3. Combined hyperlipidemia associated with type 2 diabetes mellitus ([place])   4. Type 2 diabetes mellitus with complication, with long-term current use of insulin ([place])   5. Fibromyalgia   6. Polyneuropathy associated with underlying disease ([place])   7. GAD (generalized anxiety disorder)   8. Osteoporosis without current pathological fracture, unspecified osteoporosis type   9. 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 Included topics are cancer screening recommendations, ways to keep healthy (see  AVS) including dietary and exercise recommendations, regular eye and dental care, use of seat belts, and avoidance of moderate alcohol use and tobacco use.  BMI: discussed patient's BMI and encouraged positive lifestyle modifications to help get to or maintain a target BMI. HM needs and immunizations were addressed and ordered. See below for orders. See HM and immunization section for updates. Return for flu shot Routine labs and screening tests ordered including cmp, cbc and lipids where appropriate. Discussed recommendations regarding Vit D and calcium supplementation (see AVS)  Chronic disease management visit and/or acute problem visit: DM: check a1c. Continue meds. Check urine. Jardiance 25, ozempic 0.5 weekly, met 1000 bid, and toujeo 45 units qhs. HTN is controlled w/o meds currently. Low salt diet HLD on crestor 10. Recheck levels fasting today and liver Fibro and mood are well controlled. We discussed medications: pt would like to continue current dosing. Monitor for serotonin side effects on cymbalta 60 and effexor 150 daily. Has been on this combination for many years.  Osteoporosis: treatment completed for this year. Next year will need to consider other options.  Monitoring cbc. Has seen hematology in past.   Follow up: 6 mo for recheck  No orders of the defined types were placed in this encounter.  No orders of the defined types were placed in this encounter.     Body mass index is 29.59 kg/m. Wt Readings from Last 3 Encounters:  07/23/22 172 lb 6.4 oz (78.2 kg)  04/08/22 176 lb (79.8 kg)  06/20/21 172 lb (78 kg)     Patient Active Problem List  Diagnosis Date Noted   Osteoporosis 09/07/2019    Priority: High   GAD (generalized anxiety disorder) 09/07/2019    Priority: High   Essential hypertension 09/07/2019    Priority: High   H/O lymph node biopsy - abnl follicles /B cells 76/16/0737    Priority: High    Dr. Jana Hakim: at risk for developing CLL.      Peripheral neuropathy 02/03/2017    Priority: High   Combined hyperlipidemia associated with type 2 diabetes mellitus ([place]) 09/25/2011    Priority: High   Type 2 diabetes mellitus with complication, with long-term current use of insulin ([place]) 04/24/2009    Priority: High   Fibromyalgia 11/30/2006    Priority: High   Major depression, recurrent, chronic ([place]) 11/30/1978    Priority: High   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    Priority: Medium     Stable > 5 years; released by Dr. Jana Hakim 10/2019; recheck IF > 10,000 consistently.      Bilateral primary osteoarthritis of knee 09/07/2019    Priority: Medium    Essential tremor 02/03/2017    Priority: Medium    History of breast cancer 03/23/2012    Priority: Medium     > 10 year, routine f/u.      GERD 04/24/2009    Priority: Medium    Irritable bowel syndrome with diarrhea 04/24/2009    Priority: Medium    Colon polyps 04/24/2009    Priority: Medium    Status post left hip replacement 07/17/2016    Priority: Low   History of esophageal stricture 04/24/2009    Priority: Low   Deformity of nail bed 06/25/2022   Chronic paronychia of finger 06/25/2022   SOB (shortness of breath) 09/24/2015    Ongoing for years; Cardiology evaluation with Dr. Oval Linsey: nonspecific changes on EKG; echocardiogram: mild diastolic dysfunction, no wall motion abnormalities; negative myocardial perfusion stress test. 2016     Health Maintenance  Topic Date Due   URINE MICROALBUMIN  Never done   FOOT EXAM  04/09/2021   Zoster Vaccines- Shingrix (2 of 2) 02/05/2022   MAMMOGRAM  04/10/2022   HEMOGLOBIN A1C  06/13/2022   INFLUENZA VACCINE  06/30/2022   OPHTHALMOLOGY EXAM  07/07/2022   COVID-19 Vaccine (4 - Pfizer risk series) 08/08/2022 (Originally 01/07/2021)   TETANUS/TDAP  03/25/2029 (Originally 04/27/1970)   DEXA SCAN  12/25/2023   COLONOSCOPY (Pts 45-11yr Insurance coverage will need to be confirmed)  01/29/2026   Pneumonia Vaccine 71  Years old  Completed   Hepatitis C Screening  Completed   HPV VACCINES  Aged Out   Immunization History  Administered Date(s) Administered   Fluad Quad(high Dose 65+) 09/07/2019   Influenza-Unspecified 07/29/2020   PFIZER(Purple Top)SARS-COV-2 Vaccination 12/11/2019, 01/03/2020, 11/12/2020   Pneumococcal Conjugate-13 11/06/2020   Pneumococcal Polysaccharide-23 09/07/2019   Zoster Recombinat (Shingrix) 12/11/2021   We updated and reviewed the patient's past history in detail and it is documented below. Allergies: Patient is allergic to naproxen and sulfa antibiotics. Past Medical History Patient  has a past medical history of Anxiety, Arthritis, Breast cancer ([place]), Breast infection, Chest pain (110/62/6948, Complication of anesthesia, Depression, Diabetes mellitus, Diverticulosis, Esophageal stricture, Essential hypertension (09/07/2019), Family history of adverse reaction to anesthesia, GERD (gastroesophageal reflux disease), H/O lymph node biopsy - abnl follicles /B cells (154/04/2702, Heart murmur, History of left breast cancer (01/04/2014), Hypertension, Malignant neoplasm of overlapping sites of left breast in female, estrogen receptor positive ([place] (10/13/2019), Osteoarthritis of left hip (07/17/2016), Osteoporosis (09/07/2019), Palpitations (09/24/2015), Personal  history of radiation therapy, Pneumonia, PONV (postoperative nausea and vomiting), Shortness of breath (09/24/2015), and Tremor. Past Surgical History Patient  has a past surgical history that includes Breast lumpectomy (2010); Abdominal hysterectomy; Cholecystectomy; Appendectomy; Tonsillectomy; Tubal ligation; Fracture surgery; Hand surgery; Cystectomy; Total hip arthroplasty (Left, 07/17/2016); and Colonoscopy (04/30/2009). Family History: Patient family history includes Atrial fibrillation in her father; CAD in her father; Colon cancer in her maternal great-grandmother; Colon cancer (age of onset: 80) in her mother; Diabetes  in her mother; Pancreatic cancer in her father. Social History:  Patient  reports that she quit smoking about 18 years ago. Her smoking use included cigarettes. She has never used smokeless tobacco. She reports that she does not drink alcohol and does not use drugs.  Review of Systems: Constitutional: negative for fever or malaise Ophthalmic: negative for photophobia, double vision or loss of vision Cardiovascular: negative for chest pain, dyspnea on exertion, or new LE swelling Respiratory: negative for SOB or persistent cough Gastrointestinal: negative for abdominal pain, change in bowel habits or melena Genitourinary: negative for dysuria or gross hematuria, no abnormal uterine bleeding or disharge Musculoskeletal: negative for new gait disturbance or muscular weakness Integumentary: negative for new or persistent rashes, no breast lumps Neurological: negative for TIA or stroke symptoms Psychiatric: negative for SI or delusions Allergic/Immunologic: negative for hives  Patient Care Team    Relationship Specialty Notifications Start End  Leamon Arnt, MD PCP - General Family Medicine  09/07/19   Alphonsa Overall, MD Consulting Physician General Surgery  04/05/12   Magrinat, Virgie Dad, MD (Inactive) Consulting Physician Hematology and Oncology  04/05/12   Madelin Rear, MD (Inactive) Consulting Physician Endocrinology  09/07/19   Mcarthur Rossetti, MD Consulting Physician Orthopedic Surgery  09/07/19   Marcial Pacas, MD Consulting Physician Neurology  09/07/19   Wallene Huh, [place] Consulting Physician Podiatry  09/07/19   Skeet Latch, MD Attending Physician Cardiology  09/07/19   Marica Otter, [place] Physician Optometry  03/25/20     Objective  Vitals: BP (!) 158/84   Pulse (!) 53   Temp 97.6 F (36.4 C)   Ht '5\' 4"'  (1.626 m)   Wt 172 lb 6.4 oz (78.2 kg)   SpO2 98%   BMI 29.59 kg/m  General:  Well developed, well nourished, no acute distress  Psych:  Alert and  orientedx3,normal mood and affect HEENT:  Normocephalic, atraumatic, non-icteric sclera,  supple neck without adenopathy, mass or thyromegaly Cardiovascular:  Normal S1, S2, RRR without gallop, rub + systolic murmur Respiratory:  Good breath sounds bilaterally, CTAB with normal respiratory effort Gastrointestinal: normal bowel sounds, soft, non-tender, no noted masses. No HSM MSK: no deformities, contusions. Joints are without erythema or swelling.  Skin:  Warm, no rashes or suspicious lesions noted Diabetic Foot Exam: Appearance - no lesions, ulcers or calluses Skin - no sigificant pallor or erythema Monofilament testing - sensitive bilaterally in following locations:  Right - Great toe, medial, central, lateral ball and posterior foot intact  Left - Great toe, medial, central, lateral ball and posterior foot intact Pulses - +2 distally bilaterally   Commons side effects, risks, benefits, and alternatives for medications and treatment plan prescribed today were discussed, and the patient expressed understanding of the given instructions. Patient is instructed to call or message via MyChart if he/she has any questions or concerns regarding our treatment plan. No barriers to understanding were identified. We discussed Red Flag symptoms and signs in detail. Patient expressed understanding regarding what to  do in case of urgent or emergency type symptoms.  Medication list was reconciled, printed and provided to the patient in AVS. Patient instructions and summary information was reviewed with the patient as documented in the AVS. This note was prepared with assistance of Dragon voice recognition software. Occasional wrong-word or sound-a-like substitutions may have occurred due to the inherent limitations of voice recognition software  This visit occurred during the SARS-CoV-2 public health emergency.  Safety protocols were in place, including screening questions prior to the visit, additional usage  of staff PPE, and extensive cleaning of exam room while observing appropriate contact time as indicated for disinfecting solutions.

## 2022-07-23 NOTE — Patient Instructions (Signed)
Please return in 6 months for recheck.  I will release your lab results to you on your MyChart account with further instructions. You may see the results before I do, but when I review them I will send you a message with my report or have my assistant call you if things need to be discussed. Please reply to my message with any questions. Thank you!   If you have any questions or concerns, please don't hesitate to send me a message via MyChart or call the office at [phone]. Thank you for visiting with [place] today! It's our pleasure caring for you.   I have ordered a mammogram and/or bone density for you as we discussed today: '[x]'$   Mammogram  '[]'$   Bone Density  Please call the office checked below to schedule your appointment:  '[x]'$   [facility]      [place], [place]         '[]'$   [facility]  [address] [place], [place]

## 2022-08-05 ENCOUNTER — Other Ambulatory Visit: Payer: Self-pay | Admitting: Family Medicine

## 2022-08-24 ENCOUNTER — Encounter: Payer: Self-pay | Admitting: *Deleted

## 2022-10-07 ENCOUNTER — Other Ambulatory Visit: Payer: Self-pay | Admitting: Family Medicine

## 2022-11-12 ENCOUNTER — Encounter: Payer: Self-pay | Admitting: *Deleted

## 2022-12-22 ENCOUNTER — Encounter: Payer: Self-pay | Admitting: Family Medicine

## 2022-12-28 DIAGNOSIS — B3731 Acute candidiasis of vulva and vagina: Secondary | ICD-10-CM | POA: Diagnosis not present

## 2022-12-28 DIAGNOSIS — E118 Type 2 diabetes mellitus with unspecified complications: Secondary | ICD-10-CM | POA: Diagnosis not present

## 2022-12-28 DIAGNOSIS — E785 Hyperlipidemia, unspecified: Secondary | ICD-10-CM | POA: Diagnosis not present

## 2022-12-28 DIAGNOSIS — I10 Essential (primary) hypertension: Secondary | ICD-10-CM | POA: Diagnosis not present

## 2022-12-28 DIAGNOSIS — E1169 Type 2 diabetes mellitus with other specified complication: Secondary | ICD-10-CM | POA: Diagnosis not present

## 2022-12-28 DIAGNOSIS — L84 Corns and callosities: Secondary | ICD-10-CM | POA: Diagnosis not present

## 2022-12-28 DIAGNOSIS — E1165 Type 2 diabetes mellitus with hyperglycemia: Secondary | ICD-10-CM | POA: Diagnosis not present

## 2022-12-28 DIAGNOSIS — M810 Age-related osteoporosis without current pathological fracture: Secondary | ICD-10-CM | POA: Diagnosis not present

## 2023-01-09 ENCOUNTER — Other Ambulatory Visit: Payer: Self-pay | Admitting: Family Medicine

## 2023-01-22 ENCOUNTER — Ambulatory Visit: Payer: HMO | Admitting: Family Medicine

## 2023-03-22 ENCOUNTER — Other Ambulatory Visit: Payer: Self-pay | Admitting: Family Medicine

## 2023-03-22 ENCOUNTER — Telehealth: Payer: PPO | Admitting: Physician Assistant

## 2023-03-22 DIAGNOSIS — W57XXXA Bitten or stung by nonvenomous insect and other nonvenomous arthropods, initial encounter: Secondary | ICD-10-CM

## 2023-03-22 DIAGNOSIS — S80269A Insect bite (nonvenomous), unspecified knee, initial encounter: Secondary | ICD-10-CM

## 2023-03-22 MED ORDER — DOXYCYCLINE HYCLATE 100 MG PO TABS: 100.0000 mg | ORAL_TABLET | Freq: Two times a day (BID) | ORAL | 0 refills | Status: DC

## 2023-03-22 NOTE — Progress Notes (Signed)
I have spent 5 minutes in review of e-visit questionnaire, review and updating patient chart, medical decision making and response to patient.   Casey Fye Cody Kierrah Kilbride, PA-C    

## 2023-03-22 NOTE — Progress Notes (Signed)
E-Visit for Tick Bite  Thank you for describing your tick bite, Here is how we plan to help! Based on the information that you shared with me it looks like you have An infected or complicated tick bite that requires a longer course of antibiotics and will need for you to schedule a follow-up visit with a provider.  In most cases a tick bite is painless and does not itch.  Most tick bites in which the tick is quickly removed do not require prescriptions. Ticks can transmit several diseases if they are infected and remain attacked to your skin. Therefore the length that the tick was attached and any symptoms you have experienced after the bite are import to accurately develop your custom treatment plan. In most cases a single dose of doxycycline may prevent the development of a more serious condition.  Based on your information I have Provided a home care guide for tick bites and  instructions on when to call for help. and Your symptoms indicate that you need a longer course of antibiotics and a follow up visit with a provider. I have sent doxycycline 100 mg twice a day for 14 days to the pharmacy that you selected. You will need to schedule a follow up visit with your provider. If you do not have a primary care provider you may use our telehealth physicians on the web at [facility]  Which ticks  are associated with illness?  The Wood Tick (dog tick) is the size of a watermelon seed and can sometimes transmit [facility] spotted fever and [place] tick fever.   The Deer Tick (black-legged tick) is between the size of a poppy seed (pin head) and an apple seed, and can sometimes transmit Lyme disease.  A brown to black tick with a white splotch on its back is likely a female Amblyomma americanum ([place] tick). This tick has been associated with Southern Tick Associated illness ( STARI)  Lyme disease has become the most common tick-borne illness in the [place]. The risk of Lyme  disease following a recognized deer tick bite is estimated to be 1%.  The majority of cases of Lyme disease start with a bull's eye rash at the site of the tick bite. The rash can occur days to weeks (typically 7-10 days) after a tick bite. Treatment with antibiotics is indicated if this rash appears. Flu-like symptoms may accompany the rash, including: fever, chills, headaches, muscle aches, and fatigue. Removing ticks promptly may prevent tick borne disease.  What can be used to prevent Tick Bites?  Insect repellant with at leas 20% DEET. Wearing long pants with sock and shoes. Avoiding tall grass and heavily wooded areas. Checking your skin after being outdoors. Shower with a washcloth after outdoor exposures.  HOME CARE ADVICE FOR TICK BITE  Wood Tick Removal:  Use a pair of tweezers and grasp the wood tick close to the skin (on its head). Pull the wood tick straight upward without twisting or crushing it. Maintain a steady pressure until it releases its grip.   If tweezers aren't available, use fingers, a loop of thread around the jaws, or a needle between the jaws for traction.  Note: covering the tick with petroleum jelly, nail polish or rubbing alcohol doesn't work. Neither does touching the tick with a hot or cold object. Tiny Deer Tick Removal:   Needs to be scraped off with a knife blade or credit card edge. Place tick in a sealed container (e.g. glass jar,  zip lock plastic bag), in case your doctor wants to see it. Tick's Head Removal:  If the wood tick's head breaks off in the skin, it must be removed. Clean the skin. Then use a sterile needle to uncover the head and lift it out or scrape it off.  If a very small piece of the head remains, the skin will eventually slough it off. Antibiotic Ointment:  Wash the wound and your hands with soap and water after removal to prevent catching any tick disease.  Apply an over the counter antibiotic ointment (e.g. bacitracin) to the bite  once. Expected Course: Tick bites normally don't itch or hurt. That's why they often go unnoticed. Call Your Doctor If:  You can't remove the tick or the tick's head Fever, a severe head ache, or rash occur in the next 2 weeks Bite begins to look infected Lyme's disease is common in your area You have not had a tetanus in the last 10 years Your current symptoms become worse    MAKE SURE YOU  Understand these instructions. Will watch your condition. Will get help right away if you are not doing well or get worse.    Thank you for choosing an e-visit.  Your e-visit answers were reviewed by a board certified advanced clinical practitioner to complete your personal care plan. Depending upon the condition, your plan could have included both over the counter or prescription medications.  Please review your pharmacy choice. Make sure the pharmacy is open so you can pick up prescription now. If there is a problem, you may contact your provider through [employer] and have the prescription routed to another pharmacy.  Your safety is important to [place]. If you have drug allergies check your prescription carefully.   For the next 24 hours you can use MyChart to ask questions about today's visit, request a non-urgent call back, or ask for a work or school excuse. You will get an email in the next two days asking about your experience. I hope that your e-visit has been valuable and will speed your recovery.

## 2023-03-26 DIAGNOSIS — M25511 Pain in right shoulder: Secondary | ICD-10-CM | POA: Diagnosis not present

## 2023-03-26 DIAGNOSIS — M25521 Pain in right elbow: Secondary | ICD-10-CM | POA: Diagnosis not present

## 2023-04-01 ENCOUNTER — Telehealth: Payer: Self-pay | Admitting: Family Medicine

## 2023-04-01 NOTE — Telephone Encounter (Signed)
Copied from CRM [id number]. Topic: Medicare AWV >> Apr 01, 2023  8:48 AM Gwenith Spitz wrote: Reason for CRM: Called patient to reschedule Medicare Annual Wellness Visit (AWV). Left message for patient to call back and reschedule Medicare Annual Wellness Visit (AWV).  Last date of AWV: 04/17/2022  Please schedule an appointment at any time with Inetta Fermo, [facility]. Please reschedule AWVS with Inetta Fermo, [facility].  If any questions, please contact me at [phone].  Thank you ,  Gabriel Cirri [facility] AWV TEAM Direct Dial [phone]

## 2023-04-02 ENCOUNTER — Telehealth: Payer: Self-pay | Admitting: Family Medicine

## 2023-04-02 NOTE — Telephone Encounter (Signed)
Copied from CRM [id number]. Topic: Medicare AWV >> Apr 02, 2023  9:44 AM Gwenith Spitz wrote: Reason for CRM: Called patient to reschedule Medicare Annual Wellness Visit (AWV). Left message for patient to call back and reschedule Medicare Annual Wellness Visit (AWV).  Last date of AWV: 04/17/2022  Please reschedule an appointment at any time with Inetta Fermo, [facility]. Please schedule AWVS with Inetta Fermo, [facility].  If any questions, please contact me at [phone].  Thank you ,  Gabriel Cirri [facility] AWV TEAM Direct Dial [phone]

## 2023-04-12 ENCOUNTER — Telehealth: Payer: Self-pay | Admitting: Family Medicine

## 2023-04-12 NOTE — Telephone Encounter (Signed)
Copied from CRM [id number]. Topic: Medicare AWV >> Apr 12, 2023 12:14 PM Gwenith Spitz wrote: Reason for CRM: Called patient to schedule Medicare Annual Wellness Visit (AWV). Left message for patient to call back and schedule Medicare Annual Wellness Visit (AWV).  Last date of AWV: 04/17/2022  Please schedule an appointment at any time with Inetta Fermo, [facility]. Please schedule AWVS with Inetta Fermo, [facility] [place].  If any questions, please contact me at [phone].  Thank you ,  Gabriel Cirri [facility] AWV TEAM Direct Dial [phone]

## 2023-04-14 DIAGNOSIS — M810 Age-related osteoporosis without current pathological fracture: Secondary | ICD-10-CM | POA: Diagnosis not present

## 2023-04-14 DIAGNOSIS — B3731 Acute candidiasis of vulva and vagina: Secondary | ICD-10-CM | POA: Diagnosis not present

## 2023-04-14 DIAGNOSIS — E1169 Type 2 diabetes mellitus with other specified complication: Secondary | ICD-10-CM | POA: Diagnosis not present

## 2023-04-14 DIAGNOSIS — E785 Hyperlipidemia, unspecified: Secondary | ICD-10-CM | POA: Diagnosis not present

## 2023-04-14 DIAGNOSIS — E1165 Type 2 diabetes mellitus with hyperglycemia: Secondary | ICD-10-CM | POA: Diagnosis not present

## 2023-04-19 ENCOUNTER — Other Ambulatory Visit: Payer: Self-pay

## 2023-04-23 ENCOUNTER — Telehealth: Payer: Self-pay | Admitting: Pharmacy Technician

## 2023-04-23 NOTE — Telephone Encounter (Signed)
Auth Submission: APPROVED Site of care: Site of care: [facility] Payer: healthteam advt Medication & CPT/J Code(s) submitted: Prolia (Denosumab) [id number] Route of submission (phone, fax, portal):  Phone # Fax # Auth type: Buy/Bill Units/visits requested: x1 dose Reference number: [id number] Approval from: 04/23/23 to 07/21/23

## 2023-05-06 ENCOUNTER — Ambulatory Visit (INDEPENDENT_AMBULATORY_CARE_PROVIDER_SITE_OTHER): Payer: PPO

## 2023-05-06 VITALS — Wt 164.0 lb

## 2023-05-06 DIAGNOSIS — Z1231 Encounter for screening mammogram for malignant neoplasm of breast: Secondary | ICD-10-CM | POA: Diagnosis not present

## 2023-05-06 DIAGNOSIS — Z0000 Encounter for general adult medical examination without abnormal findings: Secondary | ICD-10-CM | POA: Diagnosis not present

## 2023-05-06 NOTE — Patient Instructions (Signed)
Cassandra Johnston , Thank you for taking time to come for your Medicare Wellness Visit. I appreciate your ongoing commitment to your health goals. Please review the following plan we discussed and let me know if I can assist you in the future.   These are the goals we discussed:  Goals      Patient Stated     Lose 20 lbs more      Patient Stated     Lose weight      Patient Stated     Lose weight 15 lbs         This is a list of the screening recommended for you and due dates:  Health Maintenance  Topic Date Due   DTaP/Tdap/Td vaccine (1 - Tdap) Never done   Mammogram  04/10/2022   Eye exam for diabetics  07/07/2022   COVID-19 Vaccine (4 - 2023-24 season) 07/31/2022   Hemoglobin A1C  01/23/2023   Flu Shot  07/01/2023   Yearly kidney function blood test for diabetes  07/24/2023   Yearly kidney health urinalysis for diabetes  07/24/2023   Complete foot exam   07/24/2023   DEXA scan (bone density measurement)  12/25/2023   Medicare Annual Wellness Visit  05/05/2024   Colon Cancer Screening  01/29/2026   Pneumonia Vaccine  Completed   Hepatitis C Screening  Completed   Zoster (Shingles) Vaccine  Completed   HPV Vaccine  Aged Out    Advanced directives: Please bring a copy of your health care power of attorney and living will to the office at your convenience.  Conditions/risks identified: lose 15 lbs   Next appointment: Follow up in one year for your annual wellness visit    Preventive Care 65 Years and Older, Female Preventive care refers to lifestyle choices and visits with your health care provider that can promote health and wellness. What does preventive care include? A yearly physical exam. This is also called an annual well check. Dental exams once or twice a year. Routine eye exams. Ask your health care provider how often you should have your eyes checked. Personal lifestyle choices, including: Daily care of your teeth and gums. Regular physical activity. Eating a  healthy diet. Avoiding tobacco and drug use. Limiting alcohol use. Practicing safe sex. Taking low-dose aspirin every day. Taking vitamin and mineral supplements as recommended by your health care provider. What happens during an annual well check? The services and screenings done by your health care provider during your annual well check will depend on your age, overall health, lifestyle risk factors, and family history of disease. Counseling  Your health care provider may ask you questions about your: Alcohol use. Tobacco use. Drug use. Emotional well-being. Home and relationship well-being. Sexual activity. Eating habits. History of falls. Memory and ability to understand (cognition). Work and work Astronomer. Reproductive health. Screening  You may have the following tests or measurements: Height, weight, and BMI. Blood pressure. Lipid and cholesterol levels. These may be checked every 5 years, or more frequently if you are over 68 years old. Skin check. Lung cancer screening. You may have this screening every year starting at age 60 if you have a 30-pack-year history of smoking and currently smoke or have quit within the past 15 years. Fecal occult blood test (FOBT) of the stool. You may have this test every year starting at age 56. Flexible sigmoidoscopy or colonoscopy. You may have a sigmoidoscopy every 5 years or a colonoscopy every 10 years starting at age  50. Hepatitis C blood test. Hepatitis B blood test. Sexually transmitted disease (STD) testing. Diabetes screening. This is done by checking your blood sugar (glucose) after you have not eaten for a while (fasting). You may have this done every 1-3 years. Bone density scan. This is done to screen for osteoporosis. You may have this done starting at age 56. Mammogram. This may be done every 1-2 years. Talk to your health care provider about how often you should have regular mammograms. Talk with your health care  provider about your test results, treatment options, and if necessary, the need for more tests. Vaccines  Your health care provider may recommend certain vaccines, such as: Influenza vaccine. This is recommended every year. Tetanus, diphtheria, and acellular pertussis (Tdap, Td) vaccine. You may need a Td booster every 10 years. Zoster vaccine. You may need this after age 89. Pneumococcal 13-valent conjugate (PCV13) vaccine. One dose is recommended after age 52. Pneumococcal polysaccharide (PPSV23) vaccine. One dose is recommended after age 74. Talk to your health care provider about which screenings and vaccines you need and how often you need them. This information is not intended to replace advice given to you by your health care provider. Make sure you discuss any questions you have with your health care provider. Document Released: 12/13/2015 Document Revised: 08/05/2016 Document Reviewed: 09/17/2015 Elsevier Interactive Patient Education  2017 [employer].  Fall Prevention in the Home Falls can cause injuries. They can happen to people of all ages. There are many things you can do to make your home safe and to help prevent falls. What can I do on the outside of my home? Regularly fix the edges of walkways and driveways and fix any cracks. Remove anything that might make you trip as you walk through a door, such as a raised step or threshold. Trim any bushes or trees on the path to your home. Use bright outdoor lighting. Clear any walking paths of anything that might make someone trip, such as rocks or tools. Regularly check to see if handrails are loose or broken. Make sure that both sides of any steps have handrails. Any raised decks and porches should have guardrails on the edges. Have any leaves, snow, or ice cleared regularly. Use sand or salt on walking paths during winter. Clean up any spills in your garage right away. This includes oil or grease spills. What can I do in the  bathroom? Use night lights. Install grab bars by the toilet and in the tub and shower. Do not use towel bars as grab bars. Use non-skid mats or decals in the tub or shower. If you need to sit down in the shower, use a plastic, non-slip stool. Keep the floor dry. Clean up any water that spills on the floor as soon as it happens. Remove soap buildup in the tub or shower regularly. Attach bath mats securely with double-sided non-slip rug tape. Do not have throw rugs and other things on the floor that can make you trip. What can I do in the bedroom? Use night lights. Make sure that you have a light by your bed that is easy to reach. Do not use any sheets or blankets that are too big for your bed. They should not hang down onto the floor. Have a firm chair that has side arms. You can use this for support while you get dressed. Do not have throw rugs and other things on the floor that can make you trip. What can I do  in the kitchen? Clean up any spills right away. Avoid walking on wet floors. Keep items that you use a lot in easy-to-reach places. If you need to reach something above you, use a strong step stool that has a grab bar. Keep electrical cords out of the way. Do not use floor polish or wax that makes floors slippery. If you must use wax, use non-skid floor wax. Do not have throw rugs and other things on the floor that can make you trip. What can I do with my stairs? Do not leave any items on the stairs. Make sure that there are handrails on both sides of the stairs and use them. Fix handrails that are broken or loose. Make sure that handrails are as long as the stairways. Check any carpeting to make sure that it is firmly attached to the stairs. Fix any carpet that is loose or worn. Avoid having throw rugs at the top or bottom of the stairs. If you do have throw rugs, attach them to the floor with carpet tape. Make sure that you have a light switch at the top of the stairs and the  bottom of the stairs. If you do not have them, ask someone to add them for you. What else can I do to help prevent falls? Wear shoes that: Do not have high heels. Have rubber bottoms. Are comfortable and fit you well. Are closed at the toe. Do not wear sandals. If you use a stepladder: Make sure that it is fully opened. Do not climb a closed stepladder. Make sure that both sides of the stepladder are locked into place. Ask someone to hold it for you, if possible. Clearly mark and make sure that you can see: Any grab bars or handrails. First and last steps. Where the edge of each step is. Use tools that help you move around (mobility aids) if they are needed. These include: Canes. Walkers. Scooters. Crutches. Turn on the lights when you go into a dark area. Replace any light bulbs as soon as they burn out. Set up your furniture so you have a clear path. Avoid moving your furniture around. If any of your floors are uneven, fix them. If there are any pets around you, be aware of where they are. Review your medicines with your doctor. Some medicines can make you feel dizzy. This can increase your chance of falling. Ask your doctor what other things that you can do to help prevent falls. This information is not intended to replace advice given to you by your health care provider. Make sure you discuss any questions you have with your health care provider. Document Released: 09/12/2009 Document Revised: 04/23/2016 Document Reviewed: 12/21/2014 Elsevier Interactive Patient Education  2017 [employer].

## 2023-05-06 NOTE — Progress Notes (Signed)
I connected with  Otelia Sergeant on 05/06/23 by a audio enabled telemedicine application and verified that I am speaking with the correct person using two identifiers.  Patient Location: Home  Provider Location: Office/Clinic  I discussed the limitations of evaluation and management by telemedicine. The patient expressed understanding and agreed to proceed.   Subjective:   Cassandra Johnston is a 72 y.o. female who presents for Medicare Annual (Subsequent) preventive examination.  Review of Systems     Cardiac Risk Factors include: advanced age (>36men, >23 women);hypertension;dyslipidemia;diabetes mellitus     Objective:    Today's Vitals   05/06/23 1329  Weight: 164 lb (74.4 kg)   Body mass index is 28.15 kg/m.     05/06/2023    1:36 PM 04/17/2022    8:52 AM 04/11/2021    8:51 AM 03/25/2020    2:52 PM 07/17/2016    3:45 PM 07/10/2016   10:29 AM  Advanced Directives  Does Patient Have a Medical Advance Directive? Yes Yes Yes Yes Yes Yes  Type of Estate agent of [place];Living will Healthcare Power of [place];Living will Healthcare Power of Attorney Living will;Healthcare Power of [employer] Power of Attorney Healthcare Power of Attorney  Does patient want to make changes to medical advance directive?    No - Patient declined No - Patient declined No - Patient declined  Copy of Healthcare Power of Attorney in Chart? No - copy requested No - copy requested No - copy requested No - copy requested No - copy requested No - copy requested    Current Medications (verified) Outpatient Encounter Medications as of 05/06/2023  Medication Sig   acetaminophen-codeine (TYLENOL #3) 300-30 MG tablet Take 1 tablet by mouth every 4 (four) hours as needed for moderate pain.   busPIRone (BUSPAR) 7.5 MG tablet TAKE 1 TABLET(7.5 MG) BY MOUTH TWICE DAILY   diclofenac (VOLTAREN) 75 MG EC tablet TAKE 1 TABLET(75 MG) BY MOUTH TWICE DAILY   doxycycline  (VIBRA-TABS) 100 MG tablet Take 1 tablet (100 mg total) by mouth 2 (two) times daily.   DULoxetine (CYMBALTA) 60 MG capsule TAKE 1 CAPSULE(60 MG) BY MOUTH DAILY   empagliflozin (JARDIANCE) 25 MG TABS tablet Take 1 tablet by mouth daily.   Famotidine (PEPCID PO) Take 40 mg by mouth 2 (two) times daily.   insulin glargine, 1 Unit Dial, (TOUJEO SOLOSTAR) 300 UNIT/ML Solostar Pen 45 units   metFORMIN (GLUCOPHAGE) 500 MG tablet Take 1,000 mg by mouth 2 (two) times daily.   ONETOUCH ULTRA test strip TEST TID   rosuvastatin (CRESTOR) 10 MG tablet TAKE 1 TABLET(10 MG) BY MOUTH AT BEDTIME   Semaglutide,0.25 or 0.5MG /DOS, (OZEMPIC, 0.25 OR 0.5 MG/DOSE,) 2 MG/3ML SOPN 0.5 mg   tiZANidine (ZANAFLEX) 4 MG tablet TAKE 1 TABLET(4 MG) BY MOUTH TWICE DAILY AS NEEDED FOR MUSCLE SPASMS   venlafaxine XR (EFFEXOR-XR) 150 MG 24 hr capsule TAKE 1 CAPSULE(150 MG) BY MOUTH DAILY WITH BREAKFAST   zoledronic acid (RECLAST) 5 MG/100ML SOLN injection Inject 100 mLs (5 mg total) into the vein. (Patient not taking: Reported on 05/06/2023)   No facility-administered encounter medications on file as of 05/06/2023.    Allergies (verified) Naproxen and Sulfa antibiotics   History: Past Medical History:  Diagnosis Date   Anxiety    Arthritis    Breast cancer (HCC) 2010   left   Breast infection    left breast   Chest pain 09/24/2015   Complication of anesthesia    patient  states she becomes combative with anesthesia with some surgeries   Depression    Diabetes mellitus    Diverticulosis    Esophageal stricture    Essential hypertension 09/07/2019   Family history of adverse reaction to anesthesia    father was also combative per patient report    GERD (gastroesophageal reflux disease)    H/O lymph node biopsy - abnl follicles /B cells 09/07/2019   Dr. Darnelle Catalan: at risk for developing CLL.   Heart murmur    History of left breast cancer 01/04/2014   Hypertension    Malignant neoplasm of overlapping sites of left  breast in female, estrogen receptor positive (HCC) 10/13/2019   Osteoarthritis of left hip 07/17/2016   Osteoporosis 09/07/2019   Palpitations 09/24/2015   Personal history of radiation therapy    Pneumonia    hx of    PONV (postoperative nausea and vomiting)    Shortness of breath 09/24/2015   weight related per patient    Tremor    Past Surgical History:  Procedure Laterality Date   ABDOMINAL HYSTERECTOMY     APPENDECTOMY     BREAST LUMPECTOMY  2010   CHOLECYSTECTOMY     COLONOSCOPY  04/30/2009   [facility]   CYSTECTOMY     on thyroid   FRACTURE SURGERY     feet   HAND SURGERY     TONSILLECTOMY     TOTAL HIP ARTHROPLASTY Left 07/17/2016   Procedure: LEFT TOTAL HIP ARTHROPLASTY ANTERIOR APPROACH;  Surgeon: Kathryne Hitch, MD;  Location: [facility];  Service: Orthopedics;  Laterality: Left;   TUBAL LIGATION     Family History  Problem Relation Age of Onset   Diabetes Mother    Colon cancer Mother 65   Atrial fibrillation Father    CAD Father    Pancreatic cancer Father    Colon cancer Maternal Great-grandmother    Colon polyps Neg Hx    Esophageal cancer Neg Hx    Rectal cancer Neg Hx    Stomach cancer Neg Hx    Social History   Socioeconomic History   Marital status: Married    Spouse name: Not on file   Number of children: 2   Years of education: Associates   Highest education level: Not on file  Occupational History   Occupation: Retired  Tobacco Use   Smoking status: Former    Types: Cigarettes    Quit date: 12/01/2003    Years since quitting: 19.4   Smokeless tobacco: Never   Tobacco comments:    Quit 10+ years ago  Vaping Use   Vaping Use: Never used  Substance and Sexual Activity   Alcohol use: No    Comment: Quit 25+ years ago.   Drug use: No   Sexual activity: Not on file  Other Topics Concern   Not on file  Social History Narrative   Epworth Sleepiness Scale = 9 (as of 09/24/2015)         Lives at home with husband and granddaughter.    Right-handed.   1 cup coffee per day.   Social Determinants of Health   Financial Resource Strain: Low Risk  (05/06/2023)   Overall Financial Resource Strain (CARDIA)    Difficulty of Paying Living Expenses: Not hard at all  Food Insecurity: No Food Insecurity (05/06/2023)   Hunger Vital Sign    Worried About Running Out of Food in the Last Year: Never true    Ran Out of Food in the Last  Year: Never true  Transportation Needs: No Transportation Needs (05/06/2023)   PRAPARE - Administrator, Civil Service (Medical): No    Lack of Transportation (Non-Medical): No  Physical Activity: Insufficiently Active (05/06/2023)   Exercise Vital Sign    Days of Exercise per Week: 3 days    Minutes of Exercise per Session: 30 min  Stress: No Stress Concern Present (05/06/2023)   [employer] of Occupational Health - Occupational Stress Questionnaire    Feeling of Stress : Not at all  Social Connections: Moderately Integrated (05/06/2023)   Social Connection and Isolation Panel [NHANES]    Frequency of Communication with Friends and Family: More than three times a week    Frequency of Social Gatherings with Friends and Family: Twice a week    Attends Religious Services: More than 4 times per year    Active Member of [employer] or Organizations: No    Attends Engineer, structural: Never    Marital Status: Married    Tobacco Counseling Counseling given: Not Answered Tobacco comments: Quit 10+ years ago   Clinical Intake:  Pre-visit preparation completed: Yes  Pain : No/denies pain     BMI - recorded: 28.15 Nutritional Status: BMI 25 -29 Overweight Nutritional Risks: None Diabetes: No  How often do you need to have someone help you when you read instructions, pamphlets, or other written materials from your doctor or pharmacy?: 1 - Never  Diabetic?Nutrition Risk Assessment:  Has the patient had any N/V/D within the last 2 months?  No  Does the patient have any non-healing  wounds?  No  Has the patient had any unintentional weight loss or weight gain?  No   Diabetes:  Is the patient diabetic?  Yes  If diabetic, was a CBG obtained today?  No  Did the patient bring in their glucometer from home?  No  How often do you monitor your CBG's? N/a.   Financial Strains and Diabetes Management:  Are you having any financial strains with the device, your supplies or your medication? No .  Does the patient want to be seen by Chronic Care Management for management of their diabetes?  No  Would the patient like to be referred to a Nutritionist or for Diabetic Management?  No   Diabetic Exams:  Diabetic Eye Exam: Overdue for diabetic eye exam. Pt has been advised about the importance in completing this exam. Patient advised to call and schedule an eye exam. Diabetic Foot Exam: Completed 07/23/22   Interpreter Needed?: No  Information entered by :: Lanier Ensign, LPN   Activities of Daily Living    05/06/2023    1:36 PM  In your present state of health, do you have any difficulty performing the following activities:  Hearing? 0  Vision? 0  Difficulty concentrating or making decisions? 0  Walking or climbing stairs? 0  Dressing or bathing? 0  Doing errands, shopping? 0  Preparing Food and eating ? N  Using the Toilet? N  In the past six months, have you accidently leaked urine? Y  Comment wears a pad  Do you have problems with loss of bowel control? N  Managing your Medications? N  Managing your Finances? N  Housekeeping or managing your Housekeeping? N    Patient Care Team: Willow Ora, MD as PCP - General (Family Medicine) Ovidio Kin, MD as Consulting Physician (General Surgery) Magrinat, Valentino Hue, MD (Inactive) as Consulting Physician (Hematology and Oncology) Marlene Lard, MD as Consulting Physician (  Endocrinology) Kathryne Hitch, MD as Consulting Physician (Orthopedic Surgery) Levert Feinstein, MD as Consulting Physician  (Neurology) Charlsie Merles Kirstie Peri, DPM as Consulting Physician (Podiatry) Chilton Si, MD as Attending Physician (Cardiology) Blima Ledger, OD as Consulting Physician (Optometry)  Indicate any recent Medical Services you may have received from other than [facility] providers in the past year (date may be approximate).     Assessment:   This is a routine wellness examination for [place].  Hearing/Vision screen Hearing Screening - Comments:: Pt denies any hearing issues  Vision Screening - Comments:: Pt [facility] up [facility] for annual eye exams   Dietary issues and exercise activities discussed: Current Exercise Habits: Home exercise routine, Type of exercise: walking;Other - see comments (housework), Time (Minutes): 30, Frequency (Times/Week): 3, Weekly Exercise (Minutes/Week): 90   Goals Addressed             This Visit's Progress    Patient Stated       Lose weight 15 lbs        Depression Screen    05/06/2023    1:34 PM 07/23/2022    9:51 AM 04/17/2022    8:51 AM 06/20/2021    9:08 AM 04/11/2021    8:49 AM 04/09/2020    8:39 AM 03/25/2020    2:53 PM  PHQ 2/9 Scores  PHQ - 2 Score 0 0 1 0 1 2 0  PHQ- 9 Score    0  11     Fall Risk    05/06/2023    1:36 PM 07/23/2022    9:51 AM 04/17/2022    8:53 AM 04/08/2022    8:46 AM 06/20/2021    9:08 AM  Fall Risk   Falls in the past year? 0 0 0 0 1  Number falls in past yr: 0 0 0 0 0  Injury with Fall? 0 1 0 0 0  Risk for fall due to : Impaired vision;Impaired balance/gait No Fall Risks Impaired vision;Impaired balance/gait;Impaired mobility No Fall Risks Impaired balance/gait  Risk for fall due to: Comment   at times    Follow up Falls prevention discussed Falls evaluation completed Falls prevention discussed Falls evaluation completed     FALL RISK PREVENTION PERTAINING TO THE HOME:  Any stairs in or around the home? Yes  If so, are there any without handrails? No  Home free of loose throw rugs in walkways, pet beds,  electrical cords, etc? Yes  Adequate lighting in your home to reduce risk of falls? Yes   ASSISTIVE DEVICES UTILIZED TO PREVENT FALLS:  Life alert? No  Use of a cane, walker or w/c? No  Grab bars in the bathroom? Yes  Shower chair or bench in shower? Yes  Elevated toilet seat or a handicapped toilet? Yes   TIMED UP AND GO:  Was the test performed? No .   Cognitive Function:        05/06/2023    1:37 PM 04/17/2022    8:55 AM 04/11/2021    8:56 AM 03/25/2020    2:53 PM  6CIT Screen  What Year? 0 points 0 points 0 points 0 points  What month? 0 points 0 points 0 points 0 points  What time? 0 points 0 points  0 points  Count back from 20 0 points 0 points 0 points 0 points  Months in reverse 0 points 0 points 0 points 0 points  Repeat phrase 0 points 0 points 0 points 0 points  Total Score 0  points 0 points  0 points    Immunizations Immunization History  Administered Date(s) Administered   Fluad Quad(high Dose 65+) 09/07/2019   Influenza-Unspecified 07/29/2020   PFIZER(Purple Top)SARS-COV-2 Vaccination 12/11/2019, 01/03/2020, 11/12/2020   Pneumococcal Conjugate-13 11/06/2020   Pneumococcal Polysaccharide-23 09/07/2019   Zoster Recombinat (Shingrix) 12/11/2021, 05/14/2022    TDAP status: Up to date  Flu Vaccine status: Due, Education has been provided regarding the importance of this vaccine. Advised may receive this vaccine at local pharmacy or Health Dept. Aware to provide a copy of the vaccination record if obtained from local pharmacy or Health Dept. Verbalized acceptance and understanding.  Pneumococcal vaccine status: Up to date  Covid-19 vaccine status: Completed vaccines  Qualifies for Shingles Vaccine? Yes   Zostavax completed Yes   Shingrix Completed?: Yes  Screening Tests Health Maintenance  Topic Date Due   DTaP/Tdap/Td (1 - Tdap) Never done   MAMMOGRAM  04/10/2022   OPHTHALMOLOGY EXAM  07/07/2022   COVID-19 Vaccine (4 - 2023-24 season) 07/31/2022    HEMOGLOBIN A1C  01/23/2023   INFLUENZA VACCINE  07/01/2023   Diabetic kidney evaluation - eGFR measurement  07/24/2023   Diabetic kidney evaluation - Urine ACR  07/24/2023   FOOT EXAM  07/24/2023   DEXA SCAN  12/25/2023   Medicare Annual Wellness (AWV)  05/05/2024   Colonoscopy  01/29/2026   Pneumonia Vaccine 44+ Years old  Completed   Hepatitis C Screening  Completed   Zoster Vaccines- Shingrix  Completed   HPV VACCINES  Aged Out    Health Maintenance  Health Maintenance Due  Topic Date Due   DTaP/Tdap/Td (1 - Tdap) Never done   MAMMOGRAM  04/10/2022   OPHTHALMOLOGY EXAM  07/07/2022   COVID-19 Vaccine (4 - 2023-24 season) 07/31/2022   HEMOGLOBIN A1C  01/23/2023    Colorectal cancer screening: Type of screening: Colonoscopy. Completed 02/18/21. Repeat every 5 years  Mammogram status: Ordered 05/06/23. Pt provided with contact info and advised to call to schedule appt.   Dexa scan 12/24/21   Additional Screening:  Hepatitis C Screening:  Completed 09/07/19  Vision Screening: Recommended annual ophthalmology exams for early detection of glaucoma and other disorders of the eye. Is the patient up to date with their annual eye exam?  No  Who is the provider or what is the name of the office in which the patient attends annual eye exams? [facility]  If pt is not established with a provider, would they like to be referred to a provider to establish care? No .   Dental Screening: Recommended annual dental exams for proper oral hygiene  Community Resource Referral / Chronic Care Management: CRR required this visit?  No   CCM required this visit?  No      Plan:     I have personally reviewed and noted the following in the patient's chart:   Medical and social history Use of alcohol, tobacco or illicit drugs  Current medications and supplements including opioid prescriptions. Patient is not currently taking opioid prescriptions. Functional ability and status Nutritional  status Physical activity Advanced directives List of other physicians Hospitalizations, surgeries, and ER visits in previous 12 months Vitals Screenings to include cognitive, depression, and falls Referrals and appointments  In addition, I have reviewed and discussed with patient certain preventive protocols, quality metrics, and best practice recommendations. A written personalized care plan for preventive services as well as general preventive health recommendations were provided to patient.     Marzella Schlein, LPN  05/06/2023   Nurse Notes: none

## 2023-05-26 ENCOUNTER — Ambulatory Visit (INDEPENDENT_AMBULATORY_CARE_PROVIDER_SITE_OTHER): Payer: PPO | Admitting: *Deleted

## 2023-05-26 VITALS — BP 157/89 | HR 59 | Temp 97.9°F | Resp 16 | Ht 64.0 in | Wt 167.6 lb

## 2023-05-26 DIAGNOSIS — M810 Age-related osteoporosis without current pathological fracture: Secondary | ICD-10-CM | POA: Diagnosis not present

## 2023-05-26 MED ORDER — DENOSUMAB 60 MG/ML ~~LOC~~ SOSY
60.0000 mg | PREFILLED_SYRINGE | Freq: Once | SUBCUTANEOUS | Status: AC
  Administered 2023-05-26: 60 mg via SUBCUTANEOUS
  Filled 2023-05-26: qty 1

## 2023-05-26 NOTE — Patient Instructions (Signed)
Denosumab Injection (Osteoporosis) What is this medication? DENOSUMAB (den oh SUE mab) prevents and treats osteoporosis. It works by making your bones stronger and less likely to break (fracture). It is a monoclonal antibody. This medicine may be used for other purposes; ask your health care provider or pharmacist if you have questions. COMMON BRAND NAME(S): Prolia What should I tell my care team before I take this medication? They need to know if you have any of these conditions: Dental or gum disease Had thyroid or parathyroid (glands located in neck) surgery Having dental surgery or a tooth pulled Kidney disease Low levels of calcium in the blood On dialysis Poor nutrition Thyroid disease Trouble absorbing nutrients from your food An unusual or allergic reaction to denosumab, other medications, foods, dyes, or preservatives Pregnant or trying to get pregnant Breastfeeding How should I use this medication? This medication is injected under the skin. It is given by your care team in a hospital or clinic setting. A special MedGuide will be given to you before each treatment. Be sure to read this information carefully each time. Talk to your care team about the use of this medication in children. Special care may be needed. Overdosage: If you think you have taken too much of this medicine contact a poison control center or emergency room at once. NOTE: This medicine is only for you. Do not share this medicine with others. What if I miss a dose? Keep appointments for follow-up doses. It is important not to miss your dose. Call your care team if you are unable to keep an appointment. What may interact with this medication? Do not take this medication with any of the following: Other medications that contain denosumab This medication may also interact with the following: Medications that lower your chance of fighting infection Steroid medications, such as prednisone or cortisone This  list may not describe all possible interactions. Give your health care provider a list of all the medicines, herbs, non-prescription drugs, or dietary supplements you use. Also tell them if you smoke, drink alcohol, or use illegal drugs. Some items may interact with your medicine. What should I watch for while using this medication? Your condition will be monitored carefully while you are receiving this medication. You may need blood work done while taking this medication. This medication may increase your risk of getting an infection. Call your care team for advice if you get a fever, chills, sore throat, or other symptoms of a cold or flu. Do not treat yourself. Try to avoid being around people who are sick. Tell your dentist and dental surgeon that you are taking this medication. You should not have major dental surgery while on this medication. See your dentist to have a dental exam and fix any dental problems before starting this medication. Take good care of your teeth while on this medication. Make sure you see your dentist for regular follow-up appointments. This medication may cause low levels of calcium in your body. The risk of severe side effects is increased in people with kidney disease. Your care team may prescribe calcium and vitamin D to help prevent low calcium levels while you take this medication. It is important to take calcium and vitamin D as directed by your care team. Talk to your care team if you may be pregnant. Serious birth defects may occur if you take this medication during pregnancy and for 5 months after the last dose. You will need a negative pregnancy test before starting this medication. Contraception   is recommended while taking this medication and for 5 months after the last dose. Your care team can help you find the option that works for you. Talk to your care team before breastfeeding. Changes to your treatment plan may be needed. What side effects may I notice from  receiving this medication? Side effects that you should report to your care team as soon as possible: Allergic reactions--skin rash, itching, hives, swelling of the face, lips, tongue, or throat Infection--fever, chills, cough, sore throat, wounds that don't heal, pain or trouble when passing urine, general feeling of discomfort or being unwell Low calcium level--muscle pain or cramps, confusion, tingling, or numbness in the hands or feet Osteonecrosis of the jaw--pain, swelling, or redness in the mouth, numbness of the jaw, poor healing after dental work, unusual discharge from the mouth, visible bones in the mouth Severe bone, joint, or muscle pain Skin infection--skin redness, swelling, warmth, or pain Side effects that usually do not require medical attention (report these to your care team if they continue or are bothersome): Back pain Headache Joint pain Muscle pain Pain in the hands, arms, legs, or feet Runny or stuffy nose Sore throat This list may not describe all possible side effects. Call your doctor for medical advice about side effects. You may report side effects to FDA at 1-800-FDA-1088. Where should I keep my medication? This medication is given in a hospital or clinic. It will not be stored at home. NOTE: This sheet is a summary. It may not cover all possible information. If you have questions about this medicine, talk to your doctor, pharmacist, or health care provider.  2024 Elsevier/Gold Standard (2022-12-22 00:00:00)  

## 2023-05-26 NOTE — Progress Notes (Signed)
Diagnosis: Osteoporosis  Provider:  Mannam, Praveen MD  Procedure: Injection  Prolia (Denosumab), Dose: 60 mg, Site: subcutaneous, Number of injections: 1  Post Care: Observation period completed  Discharge: Condition: Good, Destination: Home . AVS Provided  Performed by:  Bari Leib A, RN       

## 2023-06-21 ENCOUNTER — Other Ambulatory Visit: Payer: Self-pay | Admitting: Pharmacy Technician

## 2023-06-21 DIAGNOSIS — M810 Age-related osteoporosis without current pathological fracture: Secondary | ICD-10-CM

## 2023-06-22 ENCOUNTER — Telehealth: Payer: Self-pay | Admitting: *Deleted

## 2023-06-22 NOTE — Telephone Encounter (Signed)
I attempted to contact patient by telephone but was unsuccessful. According to the patient's chart they are due for follow up  with [facility]. I have left a HIPAA compliant message advising the patient to contact [facility] at [phone]. I will continue to follow up with the patient to make sure this appointment is scheduled.

## 2023-06-25 ENCOUNTER — Encounter: Payer: Self-pay | Admitting: *Deleted

## 2023-06-25 NOTE — Telephone Encounter (Signed)
I attempted to contact patient by telephone but was unsuccessful. According to the patient's chart they are due for follow up  with [facility]. I have left a HIPAA compliant message advising the patient to contact [facility] at [phone]. I will continue to follow up with the patient to make sure this appointment is scheduled.

## 2023-06-30 ENCOUNTER — Encounter (INDEPENDENT_AMBULATORY_CARE_PROVIDER_SITE_OTHER): Payer: Self-pay

## 2023-07-07 ENCOUNTER — Other Ambulatory Visit: Payer: Self-pay | Admitting: Family Medicine

## 2023-07-12 ENCOUNTER — Encounter (INDEPENDENT_AMBULATORY_CARE_PROVIDER_SITE_OTHER): Payer: Self-pay

## 2023-07-13 ENCOUNTER — Ambulatory Visit: Payer: PPO | Admitting: Family Medicine

## 2023-07-13 ENCOUNTER — Other Ambulatory Visit: Payer: Self-pay | Admitting: Oncology

## 2023-07-13 DIAGNOSIS — Z006 Encounter for examination for normal comparison and control in clinical research program: Secondary | ICD-10-CM

## 2023-08-10 ENCOUNTER — Encounter: Payer: Self-pay | Admitting: Pharmacist

## 2023-08-10 NOTE — Progress Notes (Signed)
Pharmacy Quality Measure Review  This patient is appearing on report for being at risk of failing the measure for Statin Use in Persons with Diabetes (SUPD) medications this calendar year.   She has been prescribed rosuvastatin but qualify report does not show an fills in 2024. Called her primary pharmacy [facility] and they report last rrosuvastatin efill was 08/2022. Looks like she does not have an active prescription on file.  She also has not seen Dr Mardelle Matte since 07/23/2022  Appointments for 01/22/2023 and 07/13/2023 were cancelled.   This patient is also has not filled Ozempic or metformin regularly.   Medication: Ozempic Last fill date: 03/22/2023 for 28 day supply  Medication: metformin  Last fill date: 08/03/2023 for 7 day supply Refills for 2024 - 12/08/2022 for 90DS; 01/09/2023 for 30DS; 02/17/2023 for 90 DS; 05/08/2023 for 39 DS; 06/21/2023 for 7 DS and 07/07/2023 for 7 DS.  Though due to 2 refills early in 2024 for 67 DS she actually has filled 270 day worth of metformin with 235 days needed so she actually should have excess on hand of about 35 tablets or 17.5 day supply.   I tried to call patient to discuss importance of statin therapy and see if she needed financial assistance for Ozempic.  I was unable to reach patient. LM on her home and cell phone VM reminding her to make follow up with PCP or contact our office if she is seeing another PCP.  Left office number [phone] and my direct line [phone]  [name], PharmD Clinical Pharmacist [facility]  Population Health [phone]

## 2023-08-16 DIAGNOSIS — M810 Age-related osteoporosis without current pathological fracture: Secondary | ICD-10-CM | POA: Diagnosis not present

## 2023-08-16 DIAGNOSIS — E785 Hyperlipidemia, unspecified: Secondary | ICD-10-CM | POA: Diagnosis not present

## 2023-08-16 DIAGNOSIS — E1165 Type 2 diabetes mellitus with hyperglycemia: Secondary | ICD-10-CM | POA: Diagnosis not present

## 2023-08-16 DIAGNOSIS — I10 Essential (primary) hypertension: Secondary | ICD-10-CM | POA: Diagnosis not present

## 2023-08-16 LAB — VITAMIN D 25 HYDROXY (VIT D DEFICIENCY, FRACTURES): Vit D, 25-Hydroxy: 51.1

## 2023-08-16 LAB — LIPID PANEL
Cholesterol: 177 (ref 0–200)
LDL Cholesterol: 81

## 2023-08-16 LAB — COMPREHENSIVE METABOLIC PANEL
Albumin: 3.8 (ref 3.5–5.0)
Calcium: 8.8 (ref 8.7–10.7)
eGFR: 81

## 2023-08-16 LAB — HEPATIC FUNCTION PANEL
ALT: 37 U/L — AB (ref 7–35)
AST: 42 — AB (ref 13–35)
Bilirubin, Total: 0.4

## 2023-08-16 LAB — BASIC METABOLIC PANEL
BUN: 10 (ref 4–21)
Creatinine: 0.8 (ref 0.5–1.1)
Glucose: 175
Potassium: 3.8 meq/L (ref 3.5–5.1)

## 2023-08-16 LAB — PROTEIN / CREATININE RATIO, URINE
Albumin, U: 3.6
Creatinine, Urine: 41

## 2023-08-16 LAB — MICROALBUMIN / CREATININE URINE RATIO: Microalb Creat Ratio: 9

## 2023-08-16 LAB — HEMOGLOBIN A1C: Hemoglobin A1C: 7.6

## 2023-08-18 ENCOUNTER — Telehealth: Payer: Self-pay | Admitting: Family Medicine

## 2023-08-18 NOTE — Telephone Encounter (Signed)
Lvm requesting call back to schedule for a diabetic eye exam on 9/26

## 2023-08-27 ENCOUNTER — Encounter: Payer: Self-pay | Admitting: Family Medicine

## 2023-08-27 ENCOUNTER — Ambulatory Visit (INDEPENDENT_AMBULATORY_CARE_PROVIDER_SITE_OTHER): Payer: PPO | Admitting: Family Medicine

## 2023-08-27 VITALS — BP 138/88 | HR 106 | Temp 97.1°F | Ht 64.0 in | Wt 162.0 lb

## 2023-08-27 DIAGNOSIS — Z0001 Encounter for general adult medical examination with abnormal findings: Secondary | ICD-10-CM

## 2023-08-27 DIAGNOSIS — E1169 Type 2 diabetes mellitus with other specified complication: Secondary | ICD-10-CM

## 2023-08-27 DIAGNOSIS — Z794 Long term (current) use of insulin: Secondary | ICD-10-CM | POA: Diagnosis not present

## 2023-08-27 DIAGNOSIS — Z780 Asymptomatic menopausal state: Secondary | ICD-10-CM

## 2023-08-27 DIAGNOSIS — E118 Type 2 diabetes mellitus with unspecified complications: Secondary | ICD-10-CM | POA: Diagnosis not present

## 2023-08-27 DIAGNOSIS — E119 Type 2 diabetes mellitus without complications: Secondary | ICD-10-CM

## 2023-08-27 DIAGNOSIS — F339 Major depressive disorder, recurrent, unspecified: Secondary | ICD-10-CM | POA: Diagnosis not present

## 2023-08-27 DIAGNOSIS — M810 Age-related osteoporosis without current pathological fracture: Secondary | ICD-10-CM

## 2023-08-27 DIAGNOSIS — F411 Generalized anxiety disorder: Secondary | ICD-10-CM | POA: Diagnosis not present

## 2023-08-27 DIAGNOSIS — Z23 Encounter for immunization: Secondary | ICD-10-CM

## 2023-08-27 DIAGNOSIS — E1159 Type 2 diabetes mellitus with other circulatory complications: Secondary | ICD-10-CM

## 2023-08-27 DIAGNOSIS — G63 Polyneuropathy in diseases classified elsewhere: Secondary | ICD-10-CM

## 2023-08-27 MED ORDER — ROSUVASTATIN CALCIUM 10 MG PO TABS: ORAL_TABLET | ORAL | 3 refills | Status: DC

## 2023-08-27 MED ORDER — LOSARTAN POTASSIUM 50 MG PO TABS
50.0000 mg | ORAL_TABLET | Freq: Every day | ORAL | 3 refills | Status: DC
Start: 2023-08-27 — End: 2023-11-29

## 2023-08-27 MED ORDER — VENLAFAXINE HCL ER 150 MG PO CP24: 150.0000 mg | ORAL_CAPSULE | Freq: Every day | ORAL | 3 refills | Status: DC

## 2023-08-27 MED ORDER — BUSPIRONE HCL 7.5 MG PO TABS: ORAL_TABLET | ORAL | 3 refills | Status: DC

## 2023-08-27 NOTE — Patient Instructions (Signed)
Please return in 3 months to recheck blood pressure   Please call the office checked below to schedule your appointment for your mammogram and/or bone density screen (the checked studies were ordered): [x]   Mammogram overdue now [x]   Bone Density due in january  [x]   [facility]     [address] [place], [place]        [phone]         []   [facility]  [address] [place], [place]  [phone]   If you have any questions or concerns, please don't hesitate to send me a message via MyChart or call the office at [phone]. Thank you for visiting with [place] today! It's our pleasure caring for you.

## 2023-08-27 NOTE — Progress Notes (Unsigned)
Subjective  Chief Complaint  Patient presents with   Hypertension   Shortness of Breath   Anxiety   Annual Exam    Pt here for annual exam - non fasting, pt due for eye exam - mammo scheduled in 08/2023    HPI: Cassandra Johnston is a 72 y.o. female who presents to [facility] [place] today for a Female Wellness Visit. She also has the concerns and/or needs as listed above in the chief complaint. These will be addressed in addition to the Health Maintenance Visit.   Wellness Visit: annual visit with health maintenance review and exam  HM: Flu vaccine eligible today.  She reports she is doing well.  However, very sedentary.  Gets winded easily.  No chest pain or palpitations. Chronic disease f/u and/or acute problem visit: (deemed necessary to be done in addition to the wellness visit): Diabetes managed by endocrinology: I cannot see records. Mood: Continues on Cymbalta Effexor XR and BuSpar.  No mood concerns Continues on Crestor 10 for hyperlipidemia.  Fasting for recheck Osteoporosis: Had been on Reclast and then only took 1 shot of Prolia.  Does not want further medications. Blood pressure has been well-controlled Peripheral neuropathy on gabapentin  Assessment  1. Encounter for well adult exam with abnormal findings   2. Need for influenza vaccination   3. Type 2 diabetes mellitus with complication, with long-term current use of insulin (HCC)   4. Combined hyperlipidemia associated with type 2 diabetes mellitus (HCC)   5. Insulin-requiring or dependent type II diabetes mellitus (HCC)   6. Major depression, recurrent, chronic (HCC)   7. Osteoporosis without current pathological fracture, unspecified osteoporosis type   8. GAD (generalized anxiety disorder)   9. Hypertension associated with diabetes (HCC)   10. Polyneuropathy associated with underlying disease (HCC)   11. Asymptomatic menopausal state      Plan  Female Wellness Visit: Age appropriate  Health Maintenance and Prevention measures were discussed with patient. Included topics are cancer screening recommendations, ways to keep healthy (see AVS) including dietary and exercise recommendations, regular eye and dental care, use of seat belts, and avoidance of moderate alcohol use and tobacco use.  BMI: discussed patient's BMI and encouraged positive lifestyle modifications to help get to or maintain a target BMI. HM needs and immunizations were addressed and ordered. See below for orders. See HM and immunization section for updates. Routine labs and screening tests ordered including cmp, cbc and lipids where appropriate. Discussed recommendations regarding Vit D and calcium supplementation (see AVS)  Chronic disease management visit and/or acute problem visit: Chronic problems are controlled.  See med list below.  Continue Cymbalta and Effexor at doses below for mood.  Continue diabetic medicines per endocrinology.  Continue losartan 50 for blood pressure control.  Recheck lipids on Crestor 10.  Recommend increase exercise.  Outpatient Encounter Medications as of 08/27/2023  Medication Sig Note   diclofenac (VOLTAREN) 75 MG EC tablet TAKE 1 TABLET(75 MG) BY MOUTH TWICE DAILY    DULoxetine (CYMBALTA) 60 MG capsule TAKE 1 CAPSULE(60 MG) BY MOUTH DAILY    empagliflozin (JARDIANCE) 25 MG TABS tablet Take 1 tablet by mouth daily. 08/10/2023: No refill history since 2020 - maybe getting from [facility] patient assistance program.    Famotidine (PEPCID PO) Take 40 mg by mouth 2 (two) times daily.    insulin glargine, 1 Unit Dial, (TOUJEO SOLOSTAR) 300 UNIT/ML Solostar Pen 45 units    losartan (COZAAR) 50 MG tablet  Take 1 tablet (50 mg total) by mouth daily.    metFORMIN (GLUCOPHAGE) 500 MG tablet Take 1,000 mg by mouth 2 (two) times daily.    ONETOUCH ULTRA test strip TEST TID    Semaglutide,0.25 or 0.5MG /DOS, (OZEMPIC, 0.25 OR 0.5 MG/DOSE,) 2 MG/3ML SOPN 0.5 mg    tiZANidine (ZANAFLEX) 4 MG tablet  TAKE 1 TABLET(4 MG) BY MOUTH TWICE DAILY AS NEEDED FOR MUSCLE SPASMS    [DISCONTINUED] busPIRone (BUSPAR) 7.5 MG tablet TAKE 1 TABLET(7.5 MG) BY MOUTH TWICE DAILY    [DISCONTINUED] venlafaxine XR (EFFEXOR-XR) 150 MG 24 hr capsule TAKE 1 CAPSULE(150 MG) BY MOUTH DAILY WITH BREAKFAST    [DISCONTINUED] zoledronic acid (RECLAST) 5 MG/100ML SOLN injection Inject 5 mg into the vein.    busPIRone (BUSPAR) 7.5 MG tablet TAKE 1 TABLET(7.5 MG) BY MOUTH TWICE DAILY    rosuvastatin (CRESTOR) 10 MG tablet TAKE 1 TABLET(10 MG) BY MOUTH AT BEDTIME    venlafaxine XR (EFFEXOR-XR) 150 MG 24 hr capsule Take 1 capsule (150 mg total) by mouth daily with breakfast.    [DISCONTINUED] acetaminophen-codeine (TYLENOL #3) 300-30 MG tablet Take 1 tablet by mouth every 4 (four) hours as needed for moderate pain.    [DISCONTINUED] rosuvastatin (CRESTOR) 10 MG tablet TAKE 1 TABLET(10 MG) BY MOUTH AT BEDTIME (Patient not taking: Reported on 08/27/2023)    No facility-administered encounter medications on file as of 08/27/2023.    Follow up: 6 months for recheck Orders Placed This Encounter  Procedures   DG Bone Density   Flu Vaccine Trivalent High Dose (Fluad)   CBC with Differential/Platelet   Comprehensive metabolic panel   Lipid panel   Hemoglobin A1c   TSH   Microalbumin / creatinine urine ratio   Meds ordered this encounter  Medications   busPIRone (BUSPAR) 7.5 MG tablet    Sig: TAKE 1 TABLET(7.5 MG) BY MOUTH TWICE DAILY    Dispense:  180 tablet    Refill:  3   venlafaxine XR (EFFEXOR-XR) 150 MG 24 hr capsule    Sig: Take 1 capsule (150 mg total) by mouth daily with breakfast.    Dispense:  90 capsule    Refill:  3   losartan (COZAAR) 50 MG tablet    Sig: Take 1 tablet (50 mg total) by mouth daily.    Dispense:  90 tablet    Refill:  3   rosuvastatin (CRESTOR) 10 MG tablet    Sig: TAKE 1 TABLET(10 MG) BY MOUTH AT BEDTIME    Dispense:  90 tablet    Refill:  3      Body mass index is 27.81 kg/m. Wt  Readings from Last 3 Encounters:  08/27/23 162 lb (73.5 kg)  05/26/23 167 lb 9.6 oz (76 kg)  05/06/23 164 lb (74.4 kg)     Patient Active Problem List   Diagnosis Date Noted Date Diagnosed   Osteoporosis 09/07/2019     Priority: High    Had been on reclast but did not tolerate; did Prolia 2023-24 but stopped due to cost; doesn't want anymore treatment. 08/2023    GAD (generalized anxiety disorder) 09/07/2019     Priority: High   Essential hypertension 09/07/2019     Priority: High   H/O lymph node biopsy - abnl follicles /B cells 09/07/2019     Priority: High    Dr. Darnelle Catalan: at risk for developing CLL.    Peripheral neuropathy 02/03/2017     Priority: High   Combined hyperlipidemia associated with type 2  diabetes mellitus (HCC) 09/25/2011     Priority: High   Type 2 diabetes mellitus with complication, with long-term current use of insulin (HCC) 04/24/2009     Priority: High   Fibromyalgia 11/30/2006     Priority: High   Major depression, recurrent, chronic (HCC) 11/30/1978     Priority: High   Systolic murmur 07/23/2022     Priority: Medium     Echocardiogram 2016: trivial mitral and tricuspid regurgiation. Diastolic dysfuntion.    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 Darnelle Catalan 10/2019; recheck IF > 10,000 consistently.     Bilateral primary osteoarthritis of knee 09/07/2019     Priority: Medium    Essential tremor 02/03/2017     Priority: Medium    History of breast cancer 03/23/2012     Priority: Medium     > 10 year, routine f/u.     GERD 04/24/2009     Priority: Medium    Irritable bowel syndrome with diarrhea 04/24/2009     Priority: Medium    Colon polyps 04/24/2009     Priority: Medium    Status post left hip replacement 07/17/2016     Priority: Low   History of esophageal stricture 04/24/2009     Priority: Low   Deformity of nail bed 06/25/2022    Chronic paronychia of finger 06/25/2022    SOB  (shortness of breath) 09/24/2015     Ongoing for years; Cardiology evaluation with Dr. Duke Salvia: nonspecific changes on EKG; echocardiogram: mild diastolic dysfunction, no wall motion abnormalities; negative myocardial perfusion stress test. 2016    Health Maintenance  Topic Date Due   MAMMOGRAM  04/10/2022   OPHTHALMOLOGY EXAM  07/07/2022   HEMOGLOBIN A1C  01/23/2023   Diabetic kidney evaluation - eGFR measurement  07/24/2023   Diabetic kidney evaluation - Urine ACR  07/24/2023   COVID-19 Vaccine (4 - 2023-24 season) 09/12/2023 (Originally 08/01/2023)   DEXA SCAN  12/25/2023   Medicare Annual Wellness (AWV)  05/05/2024   FOOT EXAM  08/26/2024   Colonoscopy  01/29/2026   Pneumonia Vaccine 45+ Years old  Completed   INFLUENZA VACCINE  Completed   Hepatitis C Screening  Completed   Zoster Vaccines- Shingrix  Completed   HPV VACCINES  Aged Out   DTaP/Tdap/Td  Discontinued   Immunization History  Administered Date(s) Administered   Fluad Quad(high Dose 65+) 09/07/2019   Fluad Trivalent(High Dose 65+) 08/27/2023   Influenza-Unspecified 07/29/2020   PFIZER(Purple Top)SARS-COV-2 Vaccination 12/11/2019, 01/03/2020, 11/12/2020   Pneumococcal Conjugate-13 11/06/2020   Pneumococcal Polysaccharide-23 09/07/2019   Zoster Recombinant(Shingrix) 12/11/2021, 05/14/2022   We updated and reviewed the patient's past history in detail and it is documented below. Allergies: Patient is allergic to naproxen and sulfa antibiotics. Past Medical History Patient  has a past medical history of Anxiety, Arthritis, Breast cancer (HCC) (2010), Breast infection, Chest pain (09/24/2015), Complication of anesthesia, Depression, Diabetes mellitus, Diverticulosis, Esophageal stricture, Essential hypertension (09/07/2019), Family history of adverse reaction to anesthesia, GERD (gastroesophageal reflux disease), H/O lymph node biopsy - abnl follicles /B cells (09/07/2019), Heart murmur, History of left breast cancer  (01/04/2014), Hypertension, Malignant neoplasm of overlapping sites of left breast in female, estrogen receptor positive (HCC) (10/13/2019), Osteoarthritis of left hip (07/17/2016), Osteoporosis (09/07/2019), Palpitations (09/24/2015), Personal history of radiation therapy, Pneumonia, PONV (postoperative nausea and vomiting), Shortness of breath (09/24/2015), and Tremor. Past Surgical History Patient  has a past surgical history that includes Breast  lumpectomy (2010); Abdominal hysterectomy; Cholecystectomy; Appendectomy; Tonsillectomy; Tubal ligation; Fracture surgery; Hand surgery; Cystectomy; Total hip arthroplasty (Left, 07/17/2016); and Colonoscopy (04/30/2009). Family History: Patient family history includes Atrial fibrillation in her father; CAD in her father; Colon cancer in her maternal great-grandmother; Colon cancer (age of onset: 92) in her mother; Diabetes in her mother; Pancreatic cancer in her father. Social History:  Patient  reports that she quit smoking about 19 years ago. Her smoking use included cigarettes. She has never used smokeless tobacco. She reports that she does not drink alcohol and does not use drugs.  Review of Systems: Constitutional: negative for fever or malaise Ophthalmic: negative for photophobia, double vision or loss of vision Cardiovascular: negative for chest pain, dyspnea on exertion, or new LE swelling Respiratory: negative for SOB or persistent cough Gastrointestinal: negative for abdominal pain, change in bowel habits or melena Genitourinary: negative for dysuria or gross hematuria, no abnormal uterine bleeding or disharge Musculoskeletal: negative for new gait disturbance or muscular weakness Integumentary: negative for new or persistent rashes, no breast lumps Neurological: negative for TIA or stroke symptoms Psychiatric: negative for SI or delusions Allergic/Immunologic: negative for hives  Patient Care Team    Relationship Specialty Notifications Start  End  Willow Ora, MD PCP - General Family Medicine  09/07/19   Ovidio Kin, MD Consulting Physician General Surgery  04/05/12   Magrinat, Valentino Hue, MD (Inactive) Consulting Physician Hematology and Oncology  04/05/12   Marlene Lard, MD Consulting Physician Endocrinology  09/07/19   Kathryne Hitch, MD Consulting Physician Orthopedic Surgery  09/07/19   Levert Feinstein, MD Consulting Physician Neurology  09/07/19   Lenn Sink, [place] Consulting Physician Podiatry  09/07/19   Chilton Si, MD Attending Physician Cardiology  09/07/19   Blima Ledger, OD Consulting Physician Optometry  03/25/20     Objective  Vitals: BP 138/88   Pulse (!) 106   Temp (!) 97.1 F (36.2 C)   Ht 5\' 4"  (1.626 m)   Wt 162 lb (73.5 kg)   SpO2 98%   BMI 27.81 kg/m  General:  Well developed, well nourished, no acute distress  Psych:  Alert and orientedx3,normal mood and affect HEENT:  Normocephalic, atraumatic, non-icteric sclera,  supple neck without adenopathy, mass or thyromegaly Cardiovascular:  Normal S1, S2, RRR without gallop, rub or murmur Respiratory:  Good breath sounds bilaterally, CTAB with normal respiratory effort Gastrointestinal: normal bowel sounds, soft, non-tender, no noted masses. No HSM MSK: extremities without edema, joints without erythema or swelling Neurologic:    Mental status is normal.   No tremor  Commons side effects, risks, benefits, and alternatives for medications and treatment plan prescribed today were discussed, and the patient expressed understanding of the given instructions. Patient is instructed to call or message via MyChart if he/she has any questions or concerns regarding our treatment plan. No barriers to understanding were identified. We discussed Red Flag symptoms and signs in detail. Patient expressed understanding regarding what to do in case of urgent or emergency type symptoms.  Medication list was reconciled, printed and provided to the patient in AVS.  Patient instructions and summary information was reviewed with the patient as documented in the AVS. This note was prepared with assistance of Dragon voice recognition software. Occasional wrong-word or sound-a-like substitutions may have occurred due to the inherent limitations of voice recognition software

## 2023-08-31 DIAGNOSIS — M810 Age-related osteoporosis without current pathological fracture: Secondary | ICD-10-CM | POA: Insufficient documentation

## 2023-09-02 ENCOUNTER — Encounter: Payer: Self-pay | Admitting: Family Medicine

## 2023-09-12 ENCOUNTER — Encounter: Payer: Self-pay | Admitting: Family Medicine

## 2023-09-13 ENCOUNTER — Ambulatory Visit
Admission: EM | Admit: 2023-09-13 | Discharge: 2023-09-13 | Disposition: A | Discharge status: Home / Self Care | Payer: PPO | Attending: Family Medicine | Admitting: Family Medicine

## 2023-09-13 DIAGNOSIS — M79601 Pain in right arm: Secondary | ICD-10-CM | POA: Diagnosis not present

## 2023-09-13 DIAGNOSIS — M792 Neuralgia and neuritis, unspecified: Secondary | ICD-10-CM | POA: Diagnosis not present

## 2023-09-13 MED ORDER — HYDROCODONE-ACETAMINOPHEN 5-325 MG PO TABS
1.0000 | ORAL_TABLET | Freq: Four times a day (QID) | ORAL | 0 refills | Status: DC | PRN
Start: 2023-09-13 — End: 2023-09-27

## 2023-09-13 NOTE — ED Provider Notes (Signed)
[facility]    CSN: [id number] Arrival date & time: 09/13/23  1100      History   Chief Complaint Chief Complaint  Patient presents with   Arm Pain    RT    HPI Cassandra Johnston is a 72 y.o. female.   HPI 72 year old female presents with right arm pain x 1 month reports pain as sharp and shooting in nature like electric shock waves.  Patient reports worsening since last week.  Reports has tried heat ice, Voltaren gel and oral Voltaren, gabapentin, Tylenol, and tizanidine with no relief PMH significant for left breast cancer, fibromyalgia, peripheral neuropathy and T2DM.  Past Medical History:  Diagnosis Date   Anxiety    Arthritis    Breast cancer (HCC) 2010   left   Breast infection    left breast   Chest pain 09/24/2015   Complication of anesthesia    patient states she becomes combative with anesthesia with some surgeries   Depression    Diabetes mellitus    Diverticulosis    Esophageal stricture    Essential hypertension 09/07/2019   Family history of adverse reaction to anesthesia    father was also combative per patient report    GERD (gastroesophageal reflux disease)    H/O lymph node biopsy - abnl follicles /B cells 09/07/2019   Dr. Darnelle Catalan: at risk for developing CLL.   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   Systolic murmur 07/23/2022   Deformity of nail bed 06/25/2022   Chronic paronychia of finger 06/25/2022   Persistent  lymphocytosis 10/13/2019   Osteoporosis 09/07/2019   Bilateral primary osteoarthritis of knee 09/07/2019   GAD (generalized anxiety disorder) 09/07/2019   Essential hypertension 09/07/2019   H/O lymph node biopsy - abnl follicles /B cells 09/07/2019   Essential tremor 02/03/2017   Peripheral neuropathy 02/03/2017   Status post left hip replacement 07/17/2016   SOB (shortness of breath) 09/24/2015   History of breast cancer 03/23/2012   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/2011   Type 2 diabetes mellitus with complication, with long-term current use of insulin (HCC) 04/24/2009   History of esophageal stricture 04/24/2009   GERD 04/24/2009   Irritable bowel syndrome with diarrhea 04/24/2009   Colon polyps 04/24/2009   Fibromyalgia 11/30/2006   Major depression, recurrent, chronic (HCC) 11/30/1978    Past Surgical History:  Procedure Laterality Date   ABDOMINAL HYSTERECTOMY     APPENDECTOMY     BREAST LUMPECTOMY  2010   CHOLECYSTECTOMY     COLONOSCOPY  04/30/2009   Marina Goodell   CYSTECTOMY     on thyroid   FRACTURE SURGERY     feet   HAND SURGERY     TONSILLECTOMY     TOTAL HIP ARTHROPLASTY Left 07/17/2016   Procedure: LEFT TOTAL HIP ARTHROPLASTY ANTERIOR APPROACH;  Surgeon: Kathryne Hitch, MD;  Location: [facility];  Service: Orthopedics;  Laterality: Left;   TUBAL LIGATION      OB History   No obstetric history on file.      Home Medications    Prior to Admission medications   Medication Sig Start Date End Date Taking? Authorizing Provider  HYDROcodone-acetaminophen (NORCO/VICODIN) 5-325 MG tablet Take 1 tablet by mouth every 6 (six) hours as needed. 09/13/23  Yes Trevor Iha, FNP  busPIRone (BUSPAR) 7.5 MG tablet TAKE 1 TABLET(7.5 MG) BY MOUTH TWICE DAILY 08/27/23   Willow Ora, MD  diclofenac (VOLTAREN) 75 MG EC tablet TAKE 1 TABLET(75 MG) BY MOUTH TWICE DAILY 07/07/23   Willow Ora, MD  DULoxetine (CYMBALTA) 60 MG  capsule TAKE 1 CAPSULE(60 MG) BY MOUTH DAILY 01/11/23   Willow Ora, MD  empagliflozin (JARDIANCE) 25 MG TABS tablet Take 1 tablet by mouth daily. 07/01/19   [provider]  Famotidine (PEPCID PO) Take 40 mg by mouth 2 (two) times daily.    [provider]  insulin glargine, 1 Unit Dial, (TOUJEO SOLOSTAR) 300 UNIT/ML Solostar Pen 45 units    [provider]  losartan (COZAAR) 50 MG tablet Take 1 tablet (50 mg total) by mouth daily. 08/27/23   Willow Ora, MD  metFORMIN (GLUCOPHAGE) 500 MG tablet Take 1,000 mg by mouth 2 (two) times daily. 06/26/15   [provider]  Koren Bound test strip TEST TID 07/21/19   [provider]  rosuvastatin (CRESTOR) 10 MG tablet TAKE 1 TABLET(10 MG) BY MOUTH AT BEDTIME 08/27/23   Willow Ora, MD  Semaglutide,0.25 or 0.5MG /DOS, (OZEMPIC, 0.25 OR 0.5 MG/DOSE,) 2 MG/3ML SOPN 0.5 mg 04/09/22   [provider]  tiZANidine (ZANAFLEX) 4 MG tablet TAKE 1 TABLET(4 MG) BY MOUTH TWICE DAILY AS NEEDED FOR MUSCLE SPASMS 08/05/22   Willow Ora, MD  venlafaxine XR (EFFEXOR-XR) 150 MG 24 hr capsule Take 1 capsule (150 mg total) by mouth daily with breakfast. 08/27/23   Willow Ora, MD    Family History Family History  Problem Relation Age of Onset   Diabetes Mother    Colon cancer Mother 98   Atrial fibrillation Father    CAD Father    Pancreatic cancer Father    Colon cancer Maternal Great-grandmother    Colon polyps Neg Hx    Esophageal cancer Neg Hx    Rectal cancer Neg Hx    Stomach cancer Neg Hx     Social History Social History   Tobacco Use   Smoking status: Former    Current packs/day: 0.00    Types: Cigarettes    Quit date: 12/01/2003    Years since quitting: 19.7   Smokeless tobacco: Never   Tobacco comments:    Quit 10+ years ago  Vaping Use   Vaping status: Never Used  Substance Use Topics   Alcohol use: No    Comment: Quit 25+ years ago.   Drug use: No     Allergies    Naproxen and Sulfa antibiotics   Review of Systems Review of Systems  Musculoskeletal:        Right arm pain     Physical Exam Triage Vital Signs ED Triage Vitals  Encounter Vitals Group     BP 09/13/23 1110 135/69     Systolic BP Percentile --      Diastolic BP Percentile --      Pulse Rate 09/13/23 1110 85     Resp 09/13/23 1110 17  Temp 09/13/23 1110 98.4 F (36.9 C)     Temp Source 09/13/23 1110 Oral     SpO2 09/13/23 1110 98 %     Weight --      Height --      Head Circumference --      Peak Flow --      Pain Score 09/13/23 1112 8     Pain Loc --      Pain Education --      Exclude from Growth Chart --    No data found.  Updated Vital Signs BP 135/69 (BP Location: Right Arm)   Pulse 85   Temp 98.4 F (36.9 C) (Oral)   Resp 17   SpO2 98%     Physical Exam Vitals and nursing note reviewed.  Constitutional:      General: She is not in acute distress.    Appearance: Normal appearance. She is obese. She is not ill-appearing.  HENT:     Head: Normocephalic and atraumatic.     Mouth/Throat:     Mouth: Mucous membranes are moist.     Pharynx: Oropharynx is clear.  Eyes:     Extraocular Movements: Extraocular movements intact.     Conjunctiva/sclera: Conjunctivae normal.     Pupils: Pupils are equal, round, and reactive to light.  Cardiovascular:     Rate and Rhythm: Normal rate and regular rhythm.     Pulses: Normal pulses.     Heart sounds: Normal heart sounds. No murmur heard. Pulmonary:     Effort: Pulmonary effort is normal.     Breath sounds: Normal breath sounds. No wheezing, rhonchi or rales.  Musculoskeletal:        General: Normal range of motion.     Cervical back: Normal range of motion and neck supple. No tenderness.     Comments: Anterior shoulder/upper arm/lower arm: Patient reporting sharp shocklike radiating pain that runs from the top of her shoulder under her arm to right distal forearm-believe this is a fibromyalgia flareup   Lymphadenopathy:     Cervical: No cervical adenopathy.  Skin:    General: Skin is warm and dry.  Neurological:     General: No focal deficit present.     Mental Status: She is alert and oriented to person, place, and time. Mental status is at baseline.     Comments: Right forearm essential tremor noted  Psychiatric:        Mood and Affect: Mood normal.        Behavior: Behavior normal.        Thought Content: Thought content normal.      UC Treatments / Results  Labs (all labs ordered are listed, but only abnormal results are displayed) Labs Reviewed - No data to display  EKG   Radiology No results found.  Procedures Procedures (including critical care time)  Medications Ordered in UC Medications - No data to display  Initial Impression / Assessment and Plan / UC Course  I have reviewed the triage vital signs and the nursing notes.  Pertinent labs & imaging results that were available during my care of the patient were reviewed by me and considered in my medical decision making (see chart for details).     MDM: 1.  Right arm pain-Rx'd Norco/Vicodin 5/325 mg tablet: Take 1 tablet by mouth every 6 hours as needed for acute right arm pain.  Patient advised of sedative effects of this medication.  2.  Radicular pain and right arm-Rx'd  Norco/Vicodin 5/325 mg tablet: Take 1 tablet by mouth every 6 hours as needed for acute right radicular pain of right arm. Advised patient to take medication as directed with food.  Patient advised of sedative effects of this medication.  Encouraged increase daily water intake to 64 ounces per day while taking this medication.  Advised if symptoms worsen and/or unresolved please follow-up with PCP or here for further evaluation.  Patient discharged home, hemodynamically stable. Final Clinical Impressions(s) / UC Diagnoses   Final diagnoses:  Right arm pain  Radicular pain in right arm     Discharge Instructions      Advised patient to  take medication as directed with food.  Patient advised of sedative effects of this medication.  Encouraged increase daily water intake to 64 ounces per day while taking this medication.  Advised if symptoms worsen and/or unresolved please follow-up with PCP or here for further evaluation.     ED Prescriptions     Medication Sig Dispense Auth. Provider   HYDROcodone-acetaminophen (NORCO/VICODIN) 5-325 MG tablet Take 1 tablet by mouth every 6 (six) hours as needed. 24 tablet Trevor Iha, FNP      I have reviewed the PDMP during this encounter.   Trevor Iha, FNP 09/13/23 1255

## 2023-09-13 NOTE — Discharge Instructions (Addendum)
Advised patient to take medication as directed with food.  Patient advised of sedative effects of this medication.  Encouraged increase daily water intake to 64 ounces per day while taking this medication.  Advised if symptoms worsen and/or unresolved please follow-up with PCP or here for further evaluation.

## 2023-09-13 NOTE — ED Triage Notes (Addendum)
Pt c/o RT arm pain x 1 month. Describes as "sharp shooting pain, like electric shock waves". Worsening in last week. Has tried heat/ice, voltaren (gel and oral), gabapentin, tylenol and muscle relaxers (tizanidine) prn with no relief. Hx of fibromyalgia.

## 2023-09-20 DIAGNOSIS — M79601 Pain in right arm: Secondary | ICD-10-CM | POA: Diagnosis not present

## 2023-09-20 DIAGNOSIS — E1165 Type 2 diabetes mellitus with hyperglycemia: Secondary | ICD-10-CM | POA: Diagnosis not present

## 2023-09-20 DIAGNOSIS — M810 Age-related osteoporosis without current pathological fracture: Secondary | ICD-10-CM | POA: Diagnosis not present

## 2023-09-20 DIAGNOSIS — M25511 Pain in right shoulder: Secondary | ICD-10-CM | POA: Diagnosis not present

## 2023-09-22 ENCOUNTER — Other Ambulatory Visit: Payer: Self-pay

## 2023-09-22 ENCOUNTER — Other Ambulatory Visit (INDEPENDENT_AMBULATORY_CARE_PROVIDER_SITE_OTHER): Payer: Self-pay

## 2023-09-22 ENCOUNTER — Ambulatory Visit: Payer: PPO | Admitting: Orthopaedic Surgery

## 2023-09-22 DIAGNOSIS — M25511 Pain in right shoulder: Secondary | ICD-10-CM

## 2023-09-22 DIAGNOSIS — M19011 Primary osteoarthritis, right shoulder: Secondary | ICD-10-CM

## 2023-09-22 DIAGNOSIS — Z01818 Encounter for other preprocedural examination: Secondary | ICD-10-CM

## 2023-09-22 NOTE — Progress Notes (Addendum)
Office Visit Note   Patient: Cassandra Johnston           Date of Birth: 12/18/1950           MRN: [id number] Visit Date: 09/22/2023              Requested by: Willow Ora, MD [address] [place],  [place] [address] PCP: Willow Ora, MD   Assessment & Plan: Visit Diagnoses:  1. Acute pain of right shoulder   2. Primary osteoarthritis of right shoulder     Plan:  Given the fact that cortisone injections increase her glucose levels up into 600 range would not recommend an intra-articular injection.  Therefore we will obtain a CT scan of her shoulder and have her follow-up with Dr. August Saucer.  Questions were encouraged and answered at length by Dr. Magnus Ivan and myself.  Follow-Up Instructions: No follow-ups on file.   Orders:  Orders Placed This Encounter  Procedures   XR Shoulder Right   No orders of the defined types were placed in this encounter.     Procedures: No procedures performed   Clinical Data: No additional findings.   Subjective: Chief Complaint  Patient presents with   Right Shoulder - Pain    HPI Mrs. Loud is well-known to Dr. Eliberto Ivory service ,comes in today with right shoulder pain that has been bothering her at least a month maybe longer.  No new injury.  She did have a fall back in summer and had some pain in her shoulder.  She has pain that radiates down to her elbow at times.  Feels like an electrical shock to most the pain to the anterior aspect of the shoulder down into the biceps region.  She notes decreased range of motion of the shoulder.  She does have awakening pain due to the right shoulder.  She has seen other providers for this and has been told this is most likely due to her fibromyalgia.  She is diabetic.  Reports that cortisone injections raise her glucose levels into the 600 range.  Review of Systems See HPI otherwise negative  Objective: Vital Signs: There were no vitals taken for this visit.  Physical  Exam Constitutional:      Appearance: She is not ill-appearing or diaphoretic.  Pulmonary:     Effort: Pulmonary effort is normal.  Neurological:     Mental Status: She is alert and oriented to person, place, and time.  Psychiatric:        Mood and Affect: Mood normal.     Ortho Exam Bilateral shoulders: Active overhead motion is 0-1 40 passively I can bring her to 160 degrees overhead is quite painful.  She has 5 out of 5 strength external and internal rotation against resistance empty can test is negative bilaterally.  Liftoff test good strength but on the right she has significant pain with resistance.  Impingement testing difficult to assess secondary to pain with this maneuver on the right.  Negative impingement testing on the left. Specialty Comments:  No specialty comments available.  Imaging: XR Shoulder Right  Result Date: 09/22/2023 Right shoulder 3 views shows: No acute fractures.  Shoulder is well located.  End-stage glenoid humeral arthritis.  Osteophytes of the posterior superior glenoid.  Otherwise no acute findings.    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   Systolic murmur 07/23/2022   Deformity of nail bed 06/25/2022   Chronic  paronychia of finger 06/25/2022   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   Osteoporosis 09/07/2019   Bilateral primary osteoarthritis of knee 09/07/2019   GAD (generalized anxiety disorder) 09/07/2019   Essential hypertension 09/07/2019   H/O lymph node biopsy - abnl follicles /B cells 09/07/2019   Essential tremor 02/03/2017   Peripheral neuropathy 02/03/2017   Status post left hip replacement 07/17/2016   SOB (shortness of breath) 09/24/2015   History of breast cancer 03/23/2012   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/2011   Type 2 diabetes mellitus with complication, with long-term current use of insulin (HCC) 04/24/2009    History of esophageal stricture 04/24/2009   GERD 04/24/2009   Irritable bowel syndrome with diarrhea 04/24/2009   Colon polyps 04/24/2009   Fibromyalgia 11/30/2006   Major depression, recurrent, chronic (HCC) 11/30/1978   Past Medical History:  Diagnosis Date   Anxiety    Arthritis    Breast cancer (HCC) 2010   left   Breast infection    left breast   Chest pain 09/24/2015   Complication of anesthesia    patient states she becomes combative with anesthesia with some surgeries   Depression    Diabetes mellitus    Diverticulosis    Esophageal stricture    Essential hypertension 09/07/2019   Family history of adverse reaction to anesthesia    father was also combative per patient report    GERD (gastroesophageal reflux disease)    H/O lymph node biopsy - abnl follicles /B cells 09/07/2019   Dr. Darnelle Catalan: at risk for developing CLL.   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, estrogen receptor positive (HCC) 10/13/2019   Osteoarthritis of left hip 07/17/2016   Osteoporosis 09/07/2019   Palpitations 09/24/2015   Personal history of radiation therapy    Pneumonia    hx of    PONV (postoperative nausea and vomiting)    Shortness of breath 09/24/2015   weight related per patient    Tremor     Family History  Problem Relation Age of Onset   Diabetes Mother    Colon cancer Mother 11   Atrial fibrillation Father    CAD Father    Pancreatic cancer Father    Colon cancer Maternal Great-grandmother    Colon polyps Neg Hx    Esophageal cancer Neg Hx    Rectal cancer Neg Hx    Stomach cancer Neg Hx     Past Surgical History:  Procedure Laterality Date   ABDOMINAL HYSTERECTOMY     APPENDECTOMY     BREAST LUMPECTOMY  2010   CHOLECYSTECTOMY     COLONOSCOPY  04/30/2009   Marina Goodell   CYSTECTOMY     on thyroid   FRACTURE SURGERY     feet   HAND SURGERY     TONSILLECTOMY     TOTAL  HIP ARTHROPLASTY Left 07/17/2016   Procedure: LEFT TOTAL HIP ARTHROPLASTY ANTERIOR APPROACH;  Surgeon: Kathryne Hitch, MD;  Location: [facility];  Service: Orthopedics;  Laterality: Left;   TUBAL LIGATION     Social History   Occupational History   Occupation: Retired  Tobacco Use   Smoking status: Former    Current packs/day: 0.00    Types: Cigarettes    Quit date: 12/01/2003    Years since quitting: 19.8   Smokeless tobacco: Never   Tobacco comments:    Quit  10+ years ago  Vaping Use   Vaping status: Never Used  Substance and Sexual Activity   Alcohol use: No    Comment: Quit 25+ years ago.   Drug use: No   Sexual activity: Not on file

## 2023-09-24 DIAGNOSIS — E1165 Type 2 diabetes mellitus with hyperglycemia: Secondary | ICD-10-CM | POA: Diagnosis not present

## 2023-09-27 ENCOUNTER — Other Ambulatory Visit: Payer: Self-pay | Admitting: Orthopaedic Surgery

## 2023-09-27 ENCOUNTER — Telehealth: Payer: Self-pay | Admitting: Physician Assistant

## 2023-09-27 ENCOUNTER — Encounter: Payer: Self-pay | Admitting: Orthopaedic Surgery

## 2023-09-27 ENCOUNTER — Ambulatory Visit
Admission: RE | Admit: 2023-09-27 | Discharge: 2023-09-27 | Disposition: A | Discharge status: Home / Self Care | Payer: PPO | Source: Ambulatory Visit | Attending: Physician Assistant | Admitting: Physician Assistant

## 2023-09-27 DIAGNOSIS — Z01818 Encounter for other preprocedural examination: Secondary | ICD-10-CM

## 2023-09-27 DIAGNOSIS — M25511 Pain in right shoulder: Secondary | ICD-10-CM | POA: Diagnosis not present

## 2023-09-27 DIAGNOSIS — S42211A Unspecified displaced fracture of surgical neck of right humerus, initial encounter for closed fracture: Secondary | ICD-10-CM | POA: Diagnosis not present

## 2023-09-27 DIAGNOSIS — G8929 Other chronic pain: Secondary | ICD-10-CM | POA: Diagnosis not present

## 2023-09-27 MED ORDER — HYDROCODONE-ACETAMINOPHEN 5-325 MG PO TABS: 1.0000 | ORAL_TABLET | Freq: Three times a day (TID) | ORAL | 0 refills | Status: DC | PRN

## 2023-09-27 NOTE — Telephone Encounter (Signed)
Patient called. Says she needs to see Dr. August Saucer. Dr. Diamantina Providence next available is 11/20. Patient says [facility]urb did not want her to wait that long. Would like a call back with a sooner appointment. [phone]

## 2023-09-28 ENCOUNTER — Telehealth: Payer: Self-pay | Admitting: Pharmacy Technician

## 2023-09-28 NOTE — Telephone Encounter (Signed)
Patient aware.

## 2023-09-28 NOTE — Telephone Encounter (Signed)
Auth Submission: NO AUTH NEEDED Site of care: Site of care: [facility] Payer: [employer] AD[employer] / MEDICARE A/B Medication & CPT/J Code(s) submitted: Prolia (Denosumab) [id number] Route of submission (phone, fax, portal):  Phone # Fax # Auth type: Buy/Bill PB Units/visits requested: 2 DOSES Reference number: Cristal Deer -[id number] Approval from: 09/28/23 to 09/27/24

## 2023-10-06 ENCOUNTER — Encounter: Payer: Self-pay | Admitting: Orthopaedic Surgery

## 2023-10-12 ENCOUNTER — Ambulatory Visit
Admission: RE | Admit: 2023-10-12 | Discharge: 2023-10-12 | Disposition: A | Discharge status: Home / Self Care | Payer: PPO | Source: Ambulatory Visit | Attending: Family Medicine | Admitting: Family Medicine

## 2023-10-12 DIAGNOSIS — Z1231 Encounter for screening mammogram for malignant neoplasm of breast: Secondary | ICD-10-CM

## 2023-10-13 ENCOUNTER — Ambulatory Visit (INDEPENDENT_AMBULATORY_CARE_PROVIDER_SITE_OTHER): Payer: PPO | Admitting: Family Medicine

## 2023-10-13 VITALS — BP 136/80 | HR 74 | Temp 97.8°F | Ht 64.0 in | Wt 160.6 lb

## 2023-10-13 DIAGNOSIS — Z1231 Encounter for screening mammogram for malignant neoplasm of breast: Secondary | ICD-10-CM

## 2023-10-13 DIAGNOSIS — N61 Mastitis without abscess: Secondary | ICD-10-CM | POA: Diagnosis not present

## 2023-10-13 DIAGNOSIS — Z853 Personal history of malignant neoplasm of breast: Secondary | ICD-10-CM

## 2023-10-13 DIAGNOSIS — M19011 Primary osteoarthritis, right shoulder: Secondary | ICD-10-CM | POA: Diagnosis not present

## 2023-10-13 MED ORDER — OXYCODONE-ACETAMINOPHEN 5-325 MG PO TABS: 1.0000 | ORAL_TABLET | ORAL | 0 refills | Status: DC | PRN

## 2023-10-13 MED ORDER — DOXYCYCLINE HYCLATE 100 MG PO TABS: 100.0000 mg | ORAL_TABLET | Freq: Two times a day (BID) | ORAL | 0 refills | Status: AC

## 2023-10-13 NOTE — Progress Notes (Signed)
Subjective  CC:  Chief Complaint  Patient presents with   Breast Pain    HPI: Cassandra Johnston is a 72 y.o. female who presents to the office today to address the problems listed above in the chief complaint. Discussed the use of AI scribe software for clinical note transcription with the patient, who gave verbal consent to proceed.  History of Present Illness   The patient, with a history of left-sided breast cancer, presented with recent onset of redness in the breast tissue, which started a few days prior to the consultation. The redness was associated with soreness and pain, and was accompanied by a low-grade fever of 101.37F and chills. The patient reported that the redness and associated symptoms have been improving on her own, with the help of a few doses of Doxycycline that she had at home.  In addition to the breast symptoms, the patient also reported severe pain in the left arm, which has been causing significant distress and functional impairment, to the point of expressing a desire to "lay down and die." The pain has been so severe that it has been disrupting her sleep and limiting her ability to perform daily activities such as cooking. The patient has been taking Hydrocodone for the pain, which provides relief for about two hours, but also causes itching. she is needing shoulder replacement and is working with the orthopedist, dr. Magnus Ivan.      Assessment  1. Acute mastitis of left breast   2. History of breast cancer   3. Localized osteoarthritis of right shoulder   4. Screening mammogram for breast cancer      Plan  Assessment and Plan    mastitis of the Breast   Recent onset of breast erythema and tenderness, accompanied by low-grade fever (101.37F) and chills for four days. History of left breast cancer. Self-administered doxycycline (3 doses)  with partial symptom improvement. Area appears sore and red. Discussed need for a full antibiotic course before  diagnostic mammogram. Further imaging may be necessary if infection persists to rule out cancer recurrence.   - Prescribe a full week's course of antibiotics  , doxy bid - Order diagnostic mammogram after one week if infection resolves   - Advise to call if symptoms do not improve    Chronic Arm Pain  , shoulder OA Severe, persistent arm pain causing significant distress and functional impairment. Reports daily crying due to pain. Tried various OTC medications (ibuprofen, acetaminophen) without relief. Developed itching from hydrocodone-acetaminophen, likely due to codeine. Discussed switching to Percocet (oxycodone-acetaminophen) for pain management. Informed about opioid risks, including dependency and side effects, and the importance of communicating with her other doctor about the medication change.   - Discontinue hydrocodone-acetaminophen   - Prescribe Percocet (oxycodone-acetaminophen)   - Advise to inform her other doctor about the medication switch    Follow-up   - Ensure follow-up with the surgeon as scheduled   - Order diagnostic mammogram after surgical consultation if infection has resolved.     Follow up: as scheduled. 11/29/2023  Orders Placed This Encounter  Procedures   MM 3D DIAGNOSTIC MAMMOGRAM BILATERAL BREAST   Meds ordered this encounter  Medications   oxyCODONE-acetaminophen (PERCOCET/ROXICET) 5-325 MG tablet    Sig: Take 1 tablet by mouth every 4 (four) hours as needed for severe pain (pain score 7-10) (shoulder pain).    Dispense:  30 tablet    Refill:  0   doxycycline (VIBRA-TABS) 100 MG tablet  Sig: Take 1 tablet (100 mg total) by mouth 2 (two) times daily for 7 days.    Dispense:  14 tablet    Refill:  0      I reviewed the patients updated PMH, FH, and SocHx.    Patient Active Problem List   Diagnosis Date Noted   Osteoporosis 09/07/2019    Priority: High   GAD (generalized anxiety disorder) 09/07/2019    Priority: High   Essential  hypertension 09/07/2019    Priority: High   H/O lymph node biopsy - abnl follicles /B cells 09/07/2019    Priority: High   Peripheral neuropathy 02/03/2017    Priority: High   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/2011    Priority: High   Type 2 diabetes mellitus with complication, with long-term current use of insulin (HCC) 04/24/2009    Priority: High   Fibromyalgia 11/30/2006    Priority: High   Major depression, recurrent, chronic (HCC) 11/30/1978    Priority: High   Systolic murmur 07/23/2022    Priority: Medium    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    Priority: Medium    Bilateral primary osteoarthritis of knee 09/07/2019    Priority: Medium    Essential tremor 02/03/2017    Priority: Medium    History of breast cancer 03/23/2012    Priority: Medium    GERD 04/24/2009    Priority: Medium    Irritable bowel syndrome with diarrhea 04/24/2009    Priority: Medium    Colon polyps 04/24/2009    Priority: Medium    Status post left hip replacement 07/17/2016    Priority: Low   History of esophageal stricture 04/24/2009    Priority: Low   Osteoporosis, post-menopausal 08/31/2023   Deformity of nail bed 06/25/2022   Chronic paronychia of finger 06/25/2022   SOB (shortness of breath) 09/24/2015   Current Meds  Medication Sig   doxycycline (VIBRA-TABS) 100 MG tablet Take 1 tablet (100 mg total) by mouth 2 (two) times daily for 7 days.   oxyCODONE-acetaminophen (PERCOCET/ROXICET) 5-325 MG tablet Take 1 tablet by mouth every 4 (four) hours as needed for severe pain (pain score 7-10) (shoulder pain).    Allergies: Patient is allergic to naproxen and sulfa antibiotics. Family History: Patient family history includes Atrial fibrillation in her father; CAD in her father; Colon cancer in her maternal great-grandmother; Colon cancer (age of onset: 29) in her mother; Diabetes in her mother; Pancreatic cancer in her father. Social History:   Patient  reports that she quit smoking about 19 years ago. Her smoking use included cigarettes. She has never used smokeless tobacco. She reports that she does not drink alcohol and does not use drugs.  Review of Systems: Constitutional: Negative for fever malaise or anorexia Cardiovascular: negative for chest pain Respiratory: negative for SOB or persistent cough Gastrointestinal: negative for abdominal pain  Objective  Vitals: BP 136/80 Comment: in pain today  Pulse 74   Temp 97.8 F (36.6 C)   Ht 5\' 4"  (1.626 m)   Wt 160 lb 9.6 oz (72.8 kg)   SpO2 98%   BMI 27.57 kg/m  General: nontoxic appearing, tired appearing HEENT: PEERL, conjunctiva normal, neck is supple Cardiovascular:  RRR without murmur or gallop.  Respiratory:  Good breath sounds bilaterally, CTAB with normal respiratory effort Breast exam: no masses bilaterally, left breast with redness and irritation around aereola, minimal warmth at this time. No inderation. No LAD axillary   Commons side effects, risks, benefits,  and alternatives for medications and treatment plan prescribed today were discussed, and the patient expressed understanding of the given instructions. Patient is instructed to call or message via MyChart if he/she has any questions or concerns regarding our treatment plan. No barriers to understanding were identified. We discussed Red Flag symptoms and signs in detail. Patient expressed understanding regarding what to do in case of urgent or emergency type symptoms.  Medication list was reconciled, printed and provided to the patient in AVS. Patient instructions and summary information was reviewed with the patient as documented in the AVS. This note was prepared with assistance of Dragon voice recognition software. Occasional wrong-word or sound-a-like substitutions may have occurred due to the inherent limitations of voice recognition software

## 2023-10-14 ENCOUNTER — Other Ambulatory Visit: Payer: Self-pay | Admitting: Family Medicine

## 2023-10-14 DIAGNOSIS — N61 Mastitis without abscess: Secondary | ICD-10-CM

## 2023-10-20 ENCOUNTER — Ambulatory Visit: Payer: PPO | Admitting: Orthopedic Surgery

## 2023-10-20 DIAGNOSIS — M19011 Primary osteoarthritis, right shoulder: Secondary | ICD-10-CM

## 2023-10-20 MED ORDER — TRAMADOL HCL 50 MG PO TABS: ORAL_TABLET | ORAL | 0 refills | Status: DC

## 2023-10-22 ENCOUNTER — Encounter: Payer: Self-pay | Admitting: Orthopedic Surgery

## 2023-10-22 NOTE — Progress Notes (Unsigned)
Office Visit Note   Patient: Cassandra Johnston           Date of Birth: 10-31-1951           MRN: [id number] Visit Date: 10/20/2023 Requested by: Willow Ora, MD [address] [place],  [place] [address] PCP: Willow Ora, MD  Subjective: Chief Complaint  Patient presents with   Other     Scan review    HPI: Cassandra Johnston is a 72 y.o. female who presents to the office reporting right shoulder pain.  Patient describes constant chronic pain in the right shoulder.  Pain wakes her from sleep at night.  Reports decreased range of motion as well as difficulty with ADLs.  Takes Norco 1 to 2/day without much relief.  She states that she fell at the end of the summer.  No real shoulder problem before hand.  CBGs increased after her last cortisone injection.  A1c 7.62 months ago.  She has her husband at home as well as her granddaughter who is an EMT.  She has also had a CT scan which shows a subcortical fracture along the upper articular medial margin of the humeral head with glenohumeral joint effusion and some fluid reaching into the subcoracoid bursa.  There is significant degenerative chondral thinning and spurring of the glenoid and humeral head..                ROS: All systems reviewed are negative as they relate to the chief complaint within the history of present illness.  Patient denies fevers or chills.  Assessment & Plan: Visit Diagnoses:  1. Primary osteoarthritis of right shoulder     Plan: Impression is right shoulder humeral head fracture and arthritis.  This is a subchondral type fracture with worsening symptoms.  Range of motion is worsening.  Plan thin cut CT scan 1 mm cuts to evaluate for preop reverse shoulder replacement.  Tramadol prescribed.  The risk and benefits of shoulder replacement are discussed with the patient including not limited to infection nerve vessel damage incomplete pain relief as well as incomplete restoration of function.   Patient understands the risk and benefits.  That includes dislocation as well.  All questions answered.  Plan for surgery as soon as imaging and preoperative patient's specific instrumentation can be obtained.  All questions answered.  Follow-Up Instructions: No follow-ups on file.   Orders:  Orders Placed This Encounter  Procedures   CT SHOULDER RIGHT WO CONTRAST   Meds ordered this encounter  Medications   traMADol (ULTRAM) 50 MG tablet    Sig: 1 po tid prn pain    Dispense:  30 tablet    Refill:  0      Procedures: No procedures performed   Clinical Data: No additional findings.  Objective: Vital Signs: There were no vitals taken for this visit.  Physical Exam:  Constitutional: Patient appears well-developed HEENT:  Head: Normocephalic Eyes:EOM are normal Neck: Normal range of motion Cardiovascular: Normal rate Pulmonary/chest: Effort normal Neurologic: Patient is alert Skin: Skin is warm Psychiatric: Patient has normal mood and affect  Ortho Exam: Ortho exam demonstrates range of motion on the right of 20/90/140 on the left 50/105/170.  Has pretty reasonable rotator cuff strength infraspinatus supraspinatus and subscap muscle testing although it is slightly weaker at 5- out of 5 to external rotation on the right compared to the left.  Coarse grinding and popping is present on the right-hand side with passive range of  motion.  Neck range of motion intact.  Deltoid is functional.  EPL FPL interosseous strength is intact with palpable radial pulse.  Specialty Comments:  No specialty comments available.  Imaging: No results found.   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   Osteoporosis 09/07/2019   Bilateral primary osteoarthritis of knee 09/07/2019    GAD (generalized anxiety disorder) 09/07/2019   Essential hypertension 09/07/2019   H/O lymph node biopsy - abnl follicles /B cells 09/07/2019   Essential tremor 02/03/2017   Peripheral neuropathy 02/03/2017   Status post left hip replacement 07/17/2016   SOB (shortness of breath) 09/24/2015   History of breast cancer 03/23/2012   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/2011   Type 2 diabetes mellitus with complication, with long-term current use of insulin (HCC) 04/24/2009   History of esophageal stricture 04/24/2009   GERD 04/24/2009   Irritable bowel syndrome with diarrhea 04/24/2009   Colon polyps 04/24/2009   Fibromyalgia 11/30/2006   Major depression, recurrent, chronic (HCC) 11/30/1978   Past Medical History:  Diagnosis Date   Anxiety    Arthritis    Breast cancer (HCC) 2010   left   Breast infection    left breast   Chest pain 09/24/2015   Complication of anesthesia    patient states she becomes combative with anesthesia with some surgeries   Depression    Diabetes mellitus    Diverticulosis    Esophageal stricture    Essential hypertension 09/07/2019   Family history of adverse reaction to anesthesia    father was also combative per patient report    GERD (gastroesophageal reflux disease)    H/O lymph node biopsy - abnl follicles /B cells 09/07/2019   Dr. Darnelle Catalan: at risk for developing CLL.   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, estrogen receptor positive (HCC) 10/13/2019   Osteoarthritis of left hip 07/17/2016   Osteoporosis 09/07/2019   Palpitations 09/24/2015   Personal history of radiation therapy    Pneumonia    hx of    PONV (postoperative nausea and vomiting)    Shortness of breath 09/24/2015   weight related per patient    Tremor     Family History  Problem Relation Age of Onset   Diabetes Mother    Colon  cancer Mother 20   Atrial fibrillation Father    CAD Father    Pancreatic cancer Father    Colon cancer Maternal Great-grandmother    Colon polyps Neg Hx    Esophageal cancer Neg Hx    Rectal cancer Neg Hx    Stomach cancer Neg Hx     Past Surgical History:  Procedure Laterality Date   ABDOMINAL HYSTERECTOMY     APPENDECTOMY     BREAST LUMPECTOMY  2010   CHOLECYSTECTOMY     COLONOSCOPY  04/30/2009   [facility]   CYSTECTOMY     on thyroid   FRACTURE SURGERY     feet   HAND SURGERY     TONSILLECTOMY     TOTAL HIP ARTHROPLASTY Left 07/17/2016   Procedure: LEFT TOTAL HIP ARTHROPLASTY ANTERIOR APPROACH;  Surgeon: Kathryne Hitch, MD;  Location: [facility];  Service: Orthopedics;  Laterality: Left;   TUBAL LIGATION     Social History   Occupational History   Occupation: Retired  Tobacco Use   Smoking status: Former    Current packs/day: 0.00    Types: Cigarettes    Quit date: 12/01/2003    Years since quitting: 19.9   Smokeless tobacco: Never   Tobacco comments:    Quit 10+ years ago  Vaping Use   Vaping status: Never Used  Substance and Sexual Activity   Alcohol use: No    Comment: Quit 25+ years ago.   Drug use: No   Sexual activity: Not on file

## 2023-10-25 DIAGNOSIS — E1165 Type 2 diabetes mellitus with hyperglycemia: Secondary | ICD-10-CM | POA: Diagnosis not present

## 2023-10-26 ENCOUNTER — Ambulatory Visit
Admission: RE | Admit: 2023-10-26 | Discharge: 2023-10-26 | Disposition: A | Discharge status: Home / Self Care | Payer: PPO | Source: Ambulatory Visit | Attending: Family Medicine | Admitting: Family Medicine

## 2023-10-26 ENCOUNTER — Ambulatory Visit
Admission: RE | Admit: 2023-10-26 | Discharge: 2023-10-26 | Disposition: A | Discharge status: Home / Self Care | Payer: PPO | Source: Ambulatory Visit | Attending: Orthopedic Surgery | Admitting: Orthopedic Surgery

## 2023-10-26 ENCOUNTER — Other Ambulatory Visit: Payer: Self-pay | Admitting: Family Medicine

## 2023-10-26 DIAGNOSIS — M19011 Primary osteoarthritis, right shoulder: Secondary | ICD-10-CM

## 2023-10-26 DIAGNOSIS — N6459 Other signs and symptoms in breast: Secondary | ICD-10-CM | POA: Diagnosis not present

## 2023-10-26 DIAGNOSIS — N61 Mastitis without abscess: Secondary | ICD-10-CM

## 2023-10-26 DIAGNOSIS — M25511 Pain in right shoulder: Secondary | ICD-10-CM | POA: Diagnosis not present

## 2023-10-26 DIAGNOSIS — Z853 Personal history of malignant neoplasm of breast: Secondary | ICD-10-CM

## 2023-10-26 DIAGNOSIS — Z1231 Encounter for screening mammogram for malignant neoplasm of breast: Secondary | ICD-10-CM

## 2023-11-22 ENCOUNTER — Other Ambulatory Visit: Payer: Self-pay | Admitting: Orthopedic Surgery

## 2023-11-22 MED ORDER — TRAMADOL HCL 50 MG PO TABS: ORAL_TABLET | ORAL | 0 refills | Status: DC

## 2023-11-22 NOTE — Telephone Encounter (Signed)
Sent thx

## 2023-11-26 ENCOUNTER — Ambulatory Visit: Payer: PPO | Admitting: Family Medicine

## 2023-11-29 ENCOUNTER — Encounter: Payer: Self-pay | Admitting: Family Medicine

## 2023-11-29 ENCOUNTER — Ambulatory Visit (INDEPENDENT_AMBULATORY_CARE_PROVIDER_SITE_OTHER): Payer: PPO | Admitting: Family Medicine

## 2023-11-29 VITALS — BP 149/86 | HR 59 | Ht 64.0 in

## 2023-11-29 DIAGNOSIS — G63 Polyneuropathy in diseases classified elsewhere: Secondary | ICD-10-CM

## 2023-11-29 DIAGNOSIS — Z794 Long term (current) use of insulin: Secondary | ICD-10-CM

## 2023-11-29 DIAGNOSIS — N6459 Other signs and symptoms in breast: Secondary | ICD-10-CM

## 2023-11-29 DIAGNOSIS — E118 Type 2 diabetes mellitus with unspecified complications: Secondary | ICD-10-CM | POA: Diagnosis not present

## 2023-11-29 DIAGNOSIS — R350 Frequency of micturition: Secondary | ICD-10-CM | POA: Diagnosis not present

## 2023-11-29 DIAGNOSIS — I10 Essential (primary) hypertension: Secondary | ICD-10-CM | POA: Diagnosis not present

## 2023-11-29 DIAGNOSIS — M19011 Primary osteoarthritis, right shoulder: Secondary | ICD-10-CM | POA: Insufficient documentation

## 2023-11-29 LAB — URINALYSIS, ROUTINE W REFLEX MICROSCOPIC
Bilirubin Urine: NEGATIVE
Ketones, ur: NEGATIVE
Nitrite: NEGATIVE
Specific Gravity, Urine: 1.015 (ref 1.000–1.030)
Total Protein, Urine: NEGATIVE
Urine Glucose: >=1000 — AB
Urobilinogen, UA: 0.2 (ref 0.0–1.0)
pH: 6 (ref 5.0–8.0)

## 2023-11-29 LAB — POCT GLYCOSYLATED HEMOGLOBIN (HGB A1C): Hemoglobin A1C: 6.3 % — AB (ref 4.0–5.6)

## 2023-11-29 MED ORDER — DULOXETINE HCL 60 MG PO CPEP: 60.0000 mg | ORAL_CAPSULE | Freq: Every day | ORAL | Status: DC

## 2023-11-29 MED ORDER — CEPHALEXIN 500 MG PO CAPS
500.0000 mg | ORAL_CAPSULE | Freq: Two times a day (BID) | ORAL | 0 refills | Status: DC
Start: 2023-11-29 — End: 2024-02-22

## 2023-11-29 MED ORDER — LOSARTAN POTASSIUM 100 MG PO TABS: 100.0000 mg | ORAL_TABLET | Freq: Every day | ORAL | 3 refills | Status: DC

## 2023-11-29 NOTE — Patient Instructions (Signed)
Please return in 6 mo for recheck/cpe  If you have any questions or concerns, please don't hesitate to send me a message via MyChart or call the office at [phone]. Thank you for visiting with [place] today! It's our pleasure caring for you.   Please take antibiotics and I will follow-up on your urine studies.  Your diabetes is well-controlled.  I have increased your blood pressure medicine, losartan from 50 mg to 100 mg daily.  This should get your blood pressure under good control for your upcoming surgery.

## 2023-11-29 NOTE — Progress Notes (Signed)
Subjective  CC:  Chief Complaint  Patient presents with   Hypertension   Urinary Frequency    Pt stated that she had been having frequent urination for the past week. Urine was cloudy and she had some light pain.     HPI: Cassandra Johnston is a 72 y.o. female who presents to the office today for follow up of diabetes and problems listed above in the chief complaint.  Diabetes follow up: Her diabetic control is reported as Unchanged.  She denies exertional CP or SOB or symptomatic hypoglycemia. She denies foot sores or paresthesias.  Scheduled to have right shoulder arthroplasty done in a few weeks.  I reviewed orthopedics notes.  End-stage right osteoarthritis with daily pain.  She continues to be in pain today.  She does have a hypertensive response.  Has not been checking her blood pressures at home.  No chest pain palpitations or shortness of breath.  She takes lisinopril nightly for her blood pressure Follow-up on mammogram and mastitis: Fortunately the infection has cleared.  I reviewed mammogram.  Skin thickening needs to be followed up in 3 months with mammogram and ultrasound.  Patient has this appointment scheduled. Urinary frequency, cloudy urine and some dysuria over the last 7 to 10 days.  No systemic symptoms.  No fevers chills or flank pain.  No gross hematuria. Wt Readings from Last 3 Encounters:  10/13/23 160 lb 9.6 oz (72.8 kg)  08/27/23 162 lb (73.5 kg)  05/26/23 167 lb 9.6 oz (76 kg)    BP Readings from Last 3 Encounters:  11/29/23 (!) 149/86  10/13/23 136/80  09/13/23 135/69    Assessment  1. Frequent urination   2. Essential hypertension   3. Type 2 diabetes mellitus with complication, with long-term current use of insulin (HCC)   4. Polyneuropathy associated with underlying disease (HCC)   5. Thickening of skin of breast   6. Primary osteoarthritis, right shoulder      Plan  Diabetes is currently very well controlled. No change in therapy today HTN:  marginally controlled. Also with pain response. Increase losartan to 100mg  daily.  Continue gabapentin for neuropathy.  Well-controlled. Reviewed mammogram findings, patient will follow-up in 3 months for recheck.  She reports the skin has normalized. Preoperative clearance today for upcoming shoulder surgery. Clinical UTI: Start Keflex 500 twice daily and follow-up on UA, microscopy and urine culture.  Education given  Follow up: 6 months for recheck Orders Placed This Encounter  Procedures   Urine Culture   Urinalysis, Routine w reflex microscopic   POCT Urinalysis Dipstick   POCT HgB A1C   Meds ordered this encounter  Medications   losartan (COZAAR) 100 MG tablet    Sig: Take 1 tablet (100 mg total) by mouth daily.    Dispense:  90 tablet    Refill:  3   DULoxetine (CYMBALTA) 60 MG capsule    Sig: Take 1 capsule (60 mg total) by mouth at bedtime.   cephALEXin (KEFLEX) 500 MG capsule    Sig: Take 1 capsule (500 mg total) by mouth 2 (two) times daily.    Dispense:  10 capsule    Refill:  0      Immunization History  Administered Date(s) Administered   Fluad Quad(high Dose 65+) 09/07/2019   Fluad Trivalent(High Dose 65+) 08/27/2023   Influenza-Unspecified 07/29/2020   PFIZER(Purple Top)SARS-COV-2 Vaccination 12/11/2019, 01/03/2020, 11/12/2020   Pneumococcal Conjugate-13 11/06/2020   Pneumococcal Polysaccharide-23 09/07/2019   Zoster Recombinant(Shingrix) 12/11/2021, 05/14/2022  Diabetes Related Lab Review: Lab Results  Component Value Date   HGBA1C 6.3 (A) 11/29/2023   HGBA1C 7.6 08/16/2023   HGBA1C 9.8 (H) 07/23/2022    Lab Results  Component Value Date   MICROALBUR 0.9 07/23/2022   Lab Results  Component Value Date   CREATININE 0.8 08/16/2023   BUN 10 08/16/2023   NA 140 07/23/2022   K 3.8 08/16/2023   CL 102 07/23/2022   CO2 26 07/23/2022   Lab Results  Component Value Date   CHOL 177 08/16/2023   CHOL 126 07/23/2022   CHOL 129 06/20/2021   Lab  Results  Component Value Date   HDL 64.20 07/23/2022   HDL 64.50 06/20/2021   HDL 66.70 04/09/2020   Lab Results  Component Value Date   LDLCALC 81 08/16/2023   LDLCALC 41 07/23/2022   LDLCALC 48 06/20/2021   Lab Results  Component Value Date   TRIG 102.0 07/23/2022   TRIG 85.0 06/20/2021   TRIG 107.0 04/09/2020   Lab Results  Component Value Date   CHOLHDL 2 07/23/2022   CHOLHDL 2 06/20/2021   CHOLHDL 2 04/09/2020   No results found for: "LDLDIRECT" The 10-year ASCVD risk score (Arnett DK, et al., 2019) is: 34.6%*   Values used to calculate the score:     Age: 41 years     Sex: Female     Is Non-Hispanic African American: No     Diabetic: Yes     Tobacco smoker: No     Systolic Blood Pressure: 149 mmHg     Is BP treated: Yes     HDL Cholesterol: 64.2 mg/dL     Total Cholesterol: 177 mg/dL*     * - Cholesterol units were assumed for this score calculation I have reviewed the PMH, Fam and Soc history. Patient Active Problem List   Diagnosis Date Noted Date Diagnosed   Osteoporosis 09/07/2019     Priority: High    Had been on reclast but did not tolerate; did Prolia 2023-24 but stopped due to cost; doesn't want anymore treatment. 08/2023    GAD (generalized anxiety disorder) 09/07/2019     Priority: High   Essential hypertension 09/07/2019     Priority: High   H/O lymph node biopsy - abnl follicles /B cells 09/07/2019     Priority: High    Dr. Darnelle Catalan: at risk for developing CLL.    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 Diastolic dysfuntion.    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 Darnelle Catalan 10/2019; recheck IF > 10,000 consistently.     Bilateral primary osteoarthritis of knee 09/07/2019     Priority: Medium    Essential tremor 02/03/2017     Priority: Medium    History of breast cancer 03/23/2012     Priority: Medium     > 10 year, routine f/u.     GERD 04/24/2009  Priority: Medium    Irritable bowel syndrome with diarrhea 04/24/2009     Priority: Medium    Colon polyps 04/24/2009     Priority: Medium    Status post left hip replacement 07/17/2016     Priority: Low   History of esophageal stricture 04/24/2009     Priority: Low   Primary osteoarthritis, right shoulder 11/29/2023    Deformity of nail bed 06/25/2022    Chronic paronychia of finger 06/25/2022    SOB (shortness of breath) 09/24/2015     Ongoing for years; Cardiology evaluation with Dr. Duke Salvia: nonspecific changes on EKG; echocardiogram: mild diastolic dysfunction, no wall motion abnormalities; negative myocardial perfusion stress test. 2016     Social History: Patient  reports that she quit smoking about 20 years ago. Her smoking use included cigarettes. She has never used smokeless tobacco. She reports that she does not drink alcohol and does not use drugs.  Review of Systems: Ophthalmic: negative for eye pain, loss of vision or double vision Cardiovascular: negative for chest pain Respiratory: negative for SOB or persistent cough Gastrointestinal: negative for abdominal pain Genitourinary: negative for dysuria or gross hematuria MSK: negative for foot lesions Neurologic: negative for weakness or gait disturbance  Objective  Vitals: BP (!) 149/86   Pulse (!) 59   Ht 5\' 4"  (1.626 m)   SpO2 98%   BMI 27.57 kg/m  General: well appearing, no acute distress  Psych:  Alert and oriented, normal mood and affect HEENT:   Normocephalic, atraumatic, moist mucous membranes, supple neck  Cardiovascular:  Nl S1 and S2, RRR without murmur, gallop or rub. no edema Respiratory:  Good breath sounds bilaterally, CTAB with normal effort, no rales No abdominal tenderness  Diabetic education: ongoing education regarding chronic disease management for diabetes was given today. We continue to reinforce the ABC's of diabetic management: A1c (<7 or 8 dependent upon patient), tight blood pressure control, and cholesterol management with goal LDL < 100 minimally. We discuss diet strategies, exercise recommendations, medication options and possible side effects. At each visit, we review recommended immunizations and preventive care recommendations for diabetics and stress that good diabetic control can prevent other problems. See below for this patient's data.   Commons side effects, risks, benefits, and alternatives for medications and treatment plan prescribed today were discussed, and the patient expressed understanding of the given instructions. Patient is instructed to call or message via MyChart if he/she has any questions or concerns regarding our treatment plan. No barriers to understanding were identified. We discussed Red Flag symptoms and signs in detail. Patient expressed understanding regarding what to do in case of urgent or emergency type symptoms.  Medication list was reconciled, printed and provided to the patient in AVS. Patient instructions and summary information was reviewed with the patient as documented in the AVS. This note was prepared with assistance of Dragon voice recognition software. Occasional wrong-word or sound-a-like substitutions may have occurred due to the inherent limitations of voice recognition software

## 2023-12-01 LAB — URINE CULTURE
MICRO NUMBER:: 15900068
Result:: NO GROWTH
SPECIMEN QUALITY:: ADEQUATE

## 2023-12-06 NOTE — Progress Notes (Addendum)
 Surgical Instructions   Your procedure is scheduled on December 14, 2023. Report to [facility] Main Entrance A at 5:30 A.M., then check in with the Admitting office. Any questions or running late day of surgery: call [phone]  Questions prior to your surgery date: call [phone], Monday-Friday, 8am-4pm. If you experience any cold or flu symptoms such as cough, fever, chills, shortness of breath, etc. between now and your scheduled surgery, please notify us  at the above number.     Remember:  Do not eat after midnight the night before your surgery  You may drink clear liquids until 4:30 the morning of your surgery.   Clear liquids allowed are: Water , Non-Citrus Juices (without pulp), Carbonated Beverages, Clear Tea (no milk, honey, etc.), Black Coffee Only (NO MILK, CREAM OR POWDERED CREAMER of any kind), and Gatorade.    Take these medicines the morning of surgery with A SIP OF WATER     busPIRone  (BUSPAR )  DULoxetine  (CYMBALTA )  famotidine  (PEPCID )  rosuvastatin  (CRESTOR )  tiANidine (ZANAFLEX )  venlafaxine  XR (EFFEXOR -XR)    May take these medicines IF NEEDED: acetaminophen  (TYLENOL ) dimenhyDRINATE (DRAMAMINE)  traMADol  (ULTRAM )   WHAT DO I DO ABOUT MY DIABETES MEDICATION?   Do not take oral diabetes medicines (pills) the morning of surgery.  Do not take your metFORMIN  (GLUCOPHAGE ) the morning of surgery.  Last dose should be 12/13/23.  Do not take your Ozempic for 1 week prior to your surgery.  Last dose should be 12/06/23.   Hold your empagliflozin (JARDIANCE) for 72 hours prior to surgery.  Last dose should be 12/10/23.  THE NIGHT BEFORE SURGERY, take ____22.5_______ units of TOUJEO   insulin .       If your CBG is greater than 220 mg/dL, you may take  of your sliding scale (correction) dose of insulin .   HOW TO MANAGE YOUR DIABETES BEFORE AND AFTER SURGERY  Why is it important to control my blood sugar before and after surgery? Improving blood sugar levels  before and after surgery helps healing and can limit problems. A way of improving blood sugar control is eating a healthy diet by:  Eating less sugar and carbohydrates  Increasing activity/exercise  Talking with your doctor about reaching your blood sugar goals High blood sugars (greater than 180 mg/dL) can raise your risk of infections and slow your recovery, so you will need to focus on controlling your diabetes during the weeks before surgery. Make sure that the doctor who takes care of your diabetes knows about your planned surgery including the date and location.  How do I manage my blood sugar before surgery? Check your blood sugar at least 4 times a day, starting 2 days before surgery, to make sure that the level is not too high or low.  Check your blood sugar the morning of your surgery when you wake up and every 2 hours until you get to the Short Stay unit.  If your blood sugar is less than 70 mg/dL, you will need to treat for low blood sugar: Do not take insulin . Treat a low blood sugar (less than 70 mg/dL) with  cup of clear juice (cranberry or apple), 4 glucose tablets, OR glucose gel. Recheck blood sugar in 15 minutes after treatment (to make sure it is greater than 70 mg/dL). If your blood sugar is not greater than 70 mg/dL on recheck, call [phone] for further instructions. Report your blood sugar to the short stay nurse when you get to Short Stay.  If you are admitted to  the hospital after surgery: Your blood sugar will be checked by the staff and you will probably be given insulin  after surgery (instead of oral diabetes medicines) to make sure you have good blood sugar levels. The goal for blood sugar control after surgery is 80-180 mg/dL.   One week prior to surgery, STOP taking any Aspirin  (unless otherwise instructed by your surgeon) Aleve, Naproxen, Ibuprofen, Motrin, Advil, Goody's, BC's, all herbal medications, fish oil, and non-prescription vitamins.           Oral Hygiene is also important to reduce your risk of infection.  Remember - BRUSH YOUR TEETH THE MORNING OF SURGERY WITH YOUR REGULAR TOOTHPASTE  No [facility]- Preparing for Total Shoulder Arthroplasty  Before surgery, you can play an important role. Because skin is not sterile, your skin needs to be as free of germs as possible. You can reduce the number of germs on your skin by using the following products.   Benzoyl Peroxide Gel  o Reduces the number of germs present on the skin  o Applied twice a day to shoulder area starting two days before surgery   Chlorhexidine  Gluconate (CHG) Soap (instructions listed above on how to wash with CHG Soap)  o An antiseptic cleaner that kills germs and bonds with the skin to continue killing germs even after washing  o Used for showering the night before surgery and morning of surgery   ==================================================================  Please follow these instructions carefully:  BENZOYL PEROXIDE 5% GEL  Please do not use if you have an allergy to benzoyl peroxide. If your skin becomes reddened/irritated stop using the benzoyl peroxide.  Starting two days before surgery, apply as follows:  1. Apply benzoyl peroxide in the morning and at night. Apply after taking a shower. If you are not taking a shower clean entire shoulder front, back, and side along with the armpit with a clean wet washcloth.  2. Place a quarter-sized dollop on your SHOULDER and rub in thoroughly, making sure to cover the front, back, and side of your shoulder, along with the armpit.   2 Days prior to Surgery First Dose on _____________ Morning Second Dose on ______________ Night  Day Before Surgery First Dose on ______________ Morning Night before surgery wash (entire body except face and private areas) with CHG Soap THEN Second Dose on ____________ Night   Morning of Surgery  wash BODY AGAIN with CHG Soap   4. Do NOT apply benzoyl peroxide  gel on the day of surgery   [place]- Preparing For Surgery  Before surgery, you can play an important role. Because skin is not sterile, your skin needs to be as free of germs as possible. You can reduce the number of germs on your skin by washing with CHG (chlorahexidine gluconate) Soap before surgery.  CHG is an antiseptic cleaner which kills germs and bonds with the skin to continue killing germs even after washing.     Please do not use if you have an allergy to CHG or antibacterial soaps. If your skin becomes reddened/irritated stop using the CHG.  Do not shave (including legs and underarms) for at least 48 hours prior to first CHG shower. It is OK to shave your face.  Please follow these instructions carefully.     Shower the NIGHT BEFORE SURGERY and the MORNING OF SURGERY with CHG Soap.   If you chose to wash your hair, wash your hair first as usual with your normal shampoo. After you shampoo, rinse your hair  and body thoroughly to remove the shampoo.  Then [employer] and genitals (private parts) with your normal soap and rinse thoroughly to remove soap.  After that Use CHG Soap as you would any other liquid soap. You can apply CHG directly to the skin and wash gently with a scrungie or a clean washcloth.   Apply the CHG Soap to your body ONLY FROM THE NECK DOWN.  Do not use on open wounds or open sores. Avoid contact with your eyes, ears, mouth and genitals (private parts). Wash Face and genitals (private parts)  with your normal soap.   Wash thoroughly, paying special attention to the area where your surgery will be performed.  Thoroughly rinse your body with warm water  from the neck down.  DO NOT shower/wash with your normal soap after using and rinsing off the CHG Soap.  Pat yourself dry with a CLEAN TOWEL.  8. Apply the Benzoyl Peroxide only the night before surgery.  Do Not use it the morning of surgery.  Wear CLEAN PAJAMAS to bed the night before surgery  Place CLEAN  SHEETS on your bed the night before your surgery  DO NOT SLEEP WITH PETS.   Day of Surgery: Take a shower with CHG soap. Wear Clean/Comfortable clothing the morning of surgery Do not apply any deodorants/lotions.   Remember to brush your teeth WITH YOUR REGULAR TOOTHPASTE.   Please read over the following fact sheets that you were given.        Do NOT Smoke (Tobacco/Vaping) for 24 hours prior to your procedure.  If you use a CPAP at night, you may bring your mask/headgear for your overnight stay.   You will be asked to remove any contacts, glasses, piercing's, hearing aid's, dentures/partials prior to surgery. Please bring cases for these items if needed.    Patients discharged the day of surgery will not be allowed to drive home, and someone needs to stay with them for 24 hours.  SURGICAL WAITING ROOM VISITATION Patients may have no more than 2 support people in the waiting area - these visitors may rotate.   Pre-op nurse will coordinate an appropriate time for 1 ADULT support person, who may not rotate, to accompany patient in pre-op.  Children under the age of 95 must have an adult with them who is not the patient and must remain in the main waiting area with an adult.  If the patient needs to stay at the hospital during part of their recovery, the visitor guidelines for inpatient rooms apply.  Please refer to the [facility] website for the visitor guidelines for any additional information.   If you received a COVID test during your pre-op visit  it is requested that you wear a mask when out in public, stay away from anyone that may not be feeling well and notify your surgeon if you develop symptoms. If you have been in contact with anyone that has tested positive in the last 10 days please notify you surgeon.      Pre-operative 5 CHG Bathing Instructions   You can play a key role in reducing the risk of infection after surgery. Your skin needs to be as free of germs as  possible. You can reduce the number of germs on your skin by washing with CHG (chlorhexidine  gluconate) soap before surgery. CHG is an antiseptic soap that kills germs and continues to kill germs even after washing.   DO NOT use if you have an allergy to chlorhexidine /CHG or  antibacterial soaps. If your skin becomes reddened or irritated, stop using the CHG and notify one of our RNs at [phone].   Please shower with the CHG soap starting 4 days before surgery using the following schedule:     Please keep in mind the following:  DO NOT shave, including legs and underarms, starting the day of your first shower.   You may shave your face at any point before/day of surgery.  Place clean sheets on your bed the day you start using CHG soap. Use a clean washcloth (not used since being washed) for each shower. DO NOT sleep with pets once you start using the CHG.   CHG Shower Instructions:  Wash your face and private area with normal soap. If you choose to wash your hair, wash first with your normal shampoo.  After you use shampoo/soap, rinse your hair and body thoroughly to remove shampoo/soap residue.  Turn the water  OFF and apply about 3 tablespoons (45 ml) of CHG soap to a CLEAN washcloth.  Apply CHG soap ONLY FROM YOUR NECK DOWN TO YOUR TOES (washing for 3-5 minutes)  DO NOT use CHG soap on face, private areas, open wounds, or sores.  Pay special attention to the area where your surgery is being performed.  If you are having back surgery, having someone wash your back for you may be helpful. Wait 2 minutes after CHG soap is applied, then you may rinse off the CHG soap.  Pat dry with a clean towel  Put on clean clothes/pajamas   If you choose to wear lotion, please use ONLY the CHG-compatible lotions that are listed below.  Additional instructions for the day of surgery: DO NOT APPLY any lotions, deodorants, cologne, or perfumes.   Do not bring valuables to the hospital. [facility] is  not responsible for any belongings/valuables. Do not wear nail polish, gel polish, artificial nails, or any other type of covering on natural nails (fingers and toes) Do not wear jewelry or makeup Put on clean/comfortable clothes.  Please brush your teeth.  Ask your nurse before applying any prescription medications to the skin.     CHG Compatible Lotions   Aveeno Moisturizing lotion  Cetaphil Moisturizing Cream  Cetaphil Moisturizing Lotion  Clairol Herbal Essence Moisturizing Lotion, Dry Skin  Clairol Herbal Essence Moisturizing Lotion, Extra Dry Skin  Clairol Herbal Essence Moisturizing Lotion, Normal Skin  Curel Age Defying Therapeutic Moisturizing Lotion with Alpha Hydroxy  Curel Extreme Care Body Lotion  Curel Soothing Hands Moisturizing Hand Lotion  Curel Therapeutic Moisturizing Cream, Fragrance-Free  Curel Therapeutic Moisturizing Lotion, Fragrance-Free  Curel Therapeutic Moisturizing Lotion, Original Formula  Eucerin Daily Replenishing Lotion  Eucerin Dry Skin Therapy Plus Alpha Hydroxy Crme  Eucerin Dry Skin Therapy Plus Alpha Hydroxy Lotion  Eucerin Original Crme  Eucerin Original Lotion  Eucerin Plus Crme Eucerin Plus Lotion  Eucerin TriLipid Replenishing Lotion  Keri Anti-Bacterial Hand Lotion  Keri Deep Conditioning Original Lotion Dry Skin Formula Softly Scented  Keri Deep Conditioning Original Lotion, Fragrance Free Sensitive Skin Formula  Keri Lotion Fast Absorbing Fragrance Free Sensitive Skin Formula  Keri Lotion Fast Absorbing Softly Scented Dry Skin Formula  Keri Original Lotion  Keri Skin Renewal Lotion Keri Silky Smooth Lotion  Keri Silky Smooth Sensitive Skin Lotion  Nivea Body Creamy Conditioning Oil  Nivea Body Extra Enriched Teacher, Adult Education Moisturizing Lotion Nivea Crme  Nivea Skin Firming Lotion  NutraDerm 30 Skin Lotion  NutraDerm Skin Lotion  NutraDerm Therapeutic Skin Cream  NutraDerm Therapeutic  Skin Lotion  ProShield Protective Hand Cream  Provon moisturizing lotion  Please read over the following fact sheets that you were given.

## 2023-12-07 ENCOUNTER — Other Ambulatory Visit: Payer: Self-pay

## 2023-12-07 ENCOUNTER — Encounter (HOSPITAL_COMMUNITY): Payer: Self-pay

## 2023-12-07 ENCOUNTER — Encounter (HOSPITAL_COMMUNITY)
Admission: RE | Admit: 2023-12-07 | Discharge: 2023-12-07 | Disposition: A | Discharge status: Home / Self Care | Payer: PPO | Source: Ambulatory Visit | Attending: Orthopedic Surgery | Admitting: Orthopedic Surgery

## 2023-12-07 VITALS — BP 135/57 | HR 71 | Temp 98.6°F | Resp 18 | Ht 64.0 in | Wt 157.6 lb

## 2023-12-07 DIAGNOSIS — R011 Cardiac murmur, unspecified: Secondary | ICD-10-CM | POA: Diagnosis not present

## 2023-12-07 DIAGNOSIS — I491 Atrial premature depolarization: Secondary | ICD-10-CM | POA: Insufficient documentation

## 2023-12-07 DIAGNOSIS — Z79899 Other long term (current) drug therapy: Secondary | ICD-10-CM | POA: Diagnosis not present

## 2023-12-07 DIAGNOSIS — I10 Essential (primary) hypertension: Secondary | ICD-10-CM | POA: Insufficient documentation

## 2023-12-07 DIAGNOSIS — Z87891 Personal history of nicotine dependence: Secondary | ICD-10-CM | POA: Diagnosis not present

## 2023-12-07 DIAGNOSIS — Z7985 Long-term (current) use of injectable non-insulin antidiabetic drugs: Secondary | ICD-10-CM | POA: Insufficient documentation

## 2023-12-07 DIAGNOSIS — R0600 Dyspnea, unspecified: Secondary | ICD-10-CM | POA: Insufficient documentation

## 2023-12-07 DIAGNOSIS — Z96642 Presence of left artificial hip joint: Secondary | ICD-10-CM | POA: Insufficient documentation

## 2023-12-07 DIAGNOSIS — R251 Tremor, unspecified: Secondary | ICD-10-CM | POA: Insufficient documentation

## 2023-12-07 DIAGNOSIS — Z01818 Encounter for other preprocedural examination: Secondary | ICD-10-CM | POA: Diagnosis not present

## 2023-12-07 DIAGNOSIS — M19011 Primary osteoarthritis, right shoulder: Secondary | ICD-10-CM | POA: Insufficient documentation

## 2023-12-07 DIAGNOSIS — Z853 Personal history of malignant neoplasm of breast: Secondary | ICD-10-CM | POA: Diagnosis not present

## 2023-12-07 DIAGNOSIS — F419 Anxiety disorder, unspecified: Secondary | ICD-10-CM | POA: Insufficient documentation

## 2023-12-07 DIAGNOSIS — Z7984 Long term (current) use of oral hypoglycemic drugs: Secondary | ICD-10-CM | POA: Insufficient documentation

## 2023-12-07 DIAGNOSIS — E1140 Type 2 diabetes mellitus with diabetic neuropathy, unspecified: Secondary | ICD-10-CM | POA: Diagnosis not present

## 2023-12-07 DIAGNOSIS — M797 Fibromyalgia: Secondary | ICD-10-CM | POA: Diagnosis not present

## 2023-12-07 HISTORY — DX: Fibromyalgia: M79.7

## 2023-12-07 LAB — BASIC METABOLIC PANEL
Anion gap: 9 (ref 5–15)
BUN: 13 mg/dL (ref 8–23)
CO2: 27 mmol/L (ref 22–32)
Calcium: 9.7 mg/dL (ref 8.9–10.3)
Chloride: 101 mmol/L (ref 98–111)
Creatinine, Ser: 0.84 mg/dL (ref 0.44–1.00)
GFR, Estimated: >60 mL/min (ref >60)
Glucose, Bld: 115 mg/dL — ABNORMAL HIGH (ref 70–99)
Potassium: 3.8 mmol/L (ref 3.5–5.1)
Sodium: 137 mmol/L (ref 135–145)

## 2023-12-07 LAB — CBC
HCT: 43.7 % (ref 36.0–46.0)
Hemoglobin: 14.3 g/dL (ref 12.0–15.0)
MCH: 30 pg (ref 26.0–34.0)
MCHC: 32.7 g/dL (ref 30.0–36.0)
MCV: 91.6 fL (ref 80.0–100.0)
Platelets: 259 10*3/uL (ref 150–400)
RBC: 4.77 MIL/uL (ref 3.87–5.11)
RDW: 13.9 % (ref 11.5–15.5)
WBC: 13.9 10*3/uL — ABNORMAL HIGH (ref 4.0–10.5)
nRBC: 0 % (ref 0.0–0.2)

## 2023-12-07 LAB — SURGICAL PCR SCREEN
MRSA, PCR: NEGATIVE
Staphylococcus aureus: NEGATIVE

## 2023-12-07 LAB — GLUCOSE, CAPILLARY: Glucose-Capillary: 129 mg/dL — ABNORMAL HIGH (ref 70–99)

## 2023-12-07 NOTE — Progress Notes (Signed)
 PCP - Jodie Lavern CROME, MD  Cardiologist -   PPM/ICD -  Device Orders -  Rep Notified -   Chest x-ray - denies EKG - 12-07-23 Stress Test - 10-29-15 ECHO - 10-02-15 Cardiac Cath - denies  Sleep Study - denies CPAP - n/a  Fasting Blood Sugar -  Does not always check blood sugar at home. MD monitors blood work Blood sugar at PAT appointment 129  Last dose of GLP1 agonist-  OZEMPIC  GLP1 instructions: Last dose Jan.1, 2025  Blood Thinner Instructions: denies Aspirin  Instructions:n/a  ERAS Protcol - Clear liquids until 4:30   COVID TEST- n/a   Anesthesia review: yes Hx of Htn, Dm  Patient denies shortness of breath, fever, cough and chest pain at PAT appointment   All instructions explained to the patient, with a verbal understanding of the material. Patient agrees to go over the instructions while at home for a better understanding. Patient also instructed to self quarantine after being tested for COVID-19. The opportunity to ask questions was provided.

## 2023-12-08 NOTE — Progress Notes (Signed)
 Anesthesia Chart Review:  Case: [id number] Date/Time: 12/14/23 0715   Procedures:      REVERSE SHOULDER ARTHROPLASTY (Right: Shoulder)     BICEPS TENODESIS (Right: Shoulder)   Anesthesia type: General   Pre-op diagnosis: right shoulder osteoarthritis   [facility] / [facility]   Surgeons: Addie Cordella Hamilton, MD       DISCUSSION: Patient is a 73 year old female scheduled for the above procedure.  History includes former smoker (quit 12/01/03), HTN, DM2 (with neuropathy), murmur (trace TR/MR 2016), dyspnea, breast cancer (left breast lumpectomy and axillary sentinel LN biopsy 01/22/09), osteoarthritis (left THA 07/17/16), fibromyalgia, anxiety, tremor.  Reported she became combative post-operative after a prior surgery. She had frequent supraventricular ectopy on 2016 monitor.   Evaluated by PCP Jodie Lavern CROME, MD on 11/29/23 for follow-up and preoperative evaluation. Keflex  started for clinical UTI symptoms. Losartan  increased from 50 mg to 100 mg for better HTN control for upcoming surgery.   A1c 6.3% on 11/29/23. On Jardiance, Ozempic, Toujeo . Last Ozempic 12/01/23, last Jardiance planned for 12/10/23. .   EKG on 12/07/23 showed SR with occasional PACs. She is not currently on b-blocker therapy.   Anesthesia team to evaluate on the day of surgery.   VS: BP (!) 135/57   Pulse 71   Temp 37 C (Oral)   Resp 18   Ht 5' 4 (1.626 m)   Wt 71.5 kg   SpO2 99%   BMI 27.05 kg/m   PROVIDERS: Jodie Lavern CROME, MD is PCP  - She saw cardiologist Raford Riggs, MD in 08/2015 for chest pain with anterior T wave inversion on EKG. Also reported prior history of palpitations and murmur. 08/2015 Holter monitor showed SR with frequent PACs, one 4-18 beat run of atrial tachycardia. 10/2015 echo showed LVEF 55-60%, normal wall motion, grade 1 DD, trace MR/TR. 10/2015 nuclear stress test was non-ischemic.  Metoprolol  succinate recommended for frequent supraventricular ectopies. She is no longer on  b-blocker therapy by current medication list.    LABS: Labs reviewed: Acceptable for surgery. A1c 6.3% on 11/29/23. (all labs ordered are listed, but only abnormal results are displayed)  Labs Reviewed  GLUCOSE, CAPILLARY - Abnormal; Notable for the following components:      Result Value   Glucose-Capillary 129 (*)    All other components within normal limits  CBC - Abnormal; Notable for the following components:   WBC 13.9 (*)    All other components within normal limits  BASIC METABOLIC PANEL - Abnormal; Notable for the following components:   Glucose, Bld 115 (*)    All other components within normal limits  SURGICAL PCR SCREEN    IMAGES: CT Right Shoulder 10/26/23: IMPRESSION: 1. Similar appearance of advanced osteoarthritis of the right glenohumeral joint with severe loss of articular cartilage and substantial spurring of the glenoid and humeral head, including some fragmented spurring of the posterosuperior glenoid. 2. There has been some flattening at the site of previous subarticular fracture along the posteromedial humeral head articular margin and surrounding sclerosis. No adjacent separated fragment is observed. 3. Abnormal fluid in the subacromial subdeltoid bursa and in the subcoracoid bursa, with speckled calcifications in the subcoracoid bursa increased from previous and probably related to synovitis. Strictly speaking small osteochondral fragments cannot be excluded. 4. Lower cervical spondylosis. 5. Aortic atherosclerosis.    EKG: 12/07/23: Sinus rhythm with Premature supraventricular complexes Otherwise normal ECG When compared with ECG of 21-Jan-2009 10:54, PAC new, anterior T wave inversion now absent Confirmed  by Elmira Penman [phone]) on 12/07/2023 1:57:09 PM  CV: Nuclear stress test 10/29/15: The left ventricular ejection fraction is hyperdynamic (>65%). Nuclear stress EF: 71%. The study is normal. Normal stress nuclear study with no ischemia  or infarction; EF 71 with normal wall motion.   Echo 10/02/15: Study Conclusions  - Left ventricle: The cavity size was normal. Wall thickness was    normal. Systolic function was normal. The estimated ejection    fraction was in the range of 55% to 60%. Wall motion was normal;    there were no regional wall motion abnormalities. Doppler    parameters are consistent with abnormal left ventricular    relaxation (grade 1 diastolic dysfunction).   Impressions:  - Normal LV systolic function; grade 1 diastolic dysfunction; trace MR and TR.    48 Hour Holter Monitor 09/24/15: Quality: Fair.  Baseline artifact. Predominant rhythm: sinus rhythm  Average heart rate: 79 bpm Max heart rate: 112 bpm Min heart rate: 47 bpm Pauses >2.5 seconds: 0 62 Isolated PVCs Morphology: monomorphic Supraventricular ectopics: 1682 One 4-18 beat runs of atrial tach. Patient did not submit a symptom diary - Started on metoprolol  succinate 25 mg daily   Past Medical History:  Diagnosis Date   Anxiety    Arthritis    Breast cancer (HCC) 2010   left   Breast infection    left breast   Chest pain 09/24/2015   Complication of anesthesia    patient states she becomes combative with anesthesia with some surgeries   Depression    Diabetes mellitus    Diverticulosis    Esophageal stricture    Essential hypertension 09/07/2019   Family history of adverse reaction to anesthesia    father was also combative per patient report    Fibromyalgia    GERD (gastroesophageal reflux disease)    H/O lymph node biopsy - abnl follicles /B cells 09/07/2019   Dr. Layla: at risk for developing CLL.   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, estrogen receptor positive (HCC) 10/13/2019   Osteoarthritis of left hip 07/17/2016   Osteoporosis 09/07/2019   Palpitations 09/24/2015   Personal history of radiation  therapy    Pneumonia    hx of    PONV (postoperative nausea and vomiting)    Shortness of breath 09/24/2015   weight related per patient    Tremor     Past Surgical History:  Procedure Laterality Date   ABDOMINAL HYSTERECTOMY     APPENDECTOMY     BREAST LUMPECTOMY  2010   CHOLECYSTECTOMY     COLONOSCOPY  04/30/2009   [facility]   CYSTECTOMY     on thyroid    FRACTURE SURGERY     feet   HAND SURGERY     TONSILLECTOMY     TOTAL HIP ARTHROPLASTY Left 07/17/2016   Procedure: LEFT TOTAL HIP ARTHROPLASTY ANTERIOR APPROACH;  Surgeon: Lonni CINDERELLA Poli, MD;  Location: [facility];  Service: Orthopedics;  Laterality: Left;   TUBAL LIGATION      MEDICATIONS:  acetaminophen  (TYLENOL ) 500 MG tablet   Biotin 5000 MCG TABS   busPIRone  (BUSPAR ) 7.5 MG tablet   cephALEXin  (KEFLEX ) 500 MG capsule   Cholecalciferol (D-3-5) 125 MCG (5000 UT) capsule   dimenhyDRINATE (DRAMAMINE) 50 MG tablet   DULoxetine  (CYMBALTA ) 60 MG capsule   empagliflozin (JARDIANCE) 25 MG TABS tablet   famotidine  (PEPCID )  20 MG tablet   insulin  glargine, 1 Unit Dial, (TOUJEO  SOLOSTAR) 300 UNIT/ML Solostar Pen   losartan  (COZAAR ) 100 MG tablet   metFORMIN  (GLUCOPHAGE ) 500 MG tablet   ONETOUCH ULTRA test strip   rosuvastatin  (CRESTOR ) 10 MG tablet   Semaglutide,0.25 or 0.5MG /DOS, (OZEMPIC, 0.25 OR 0.5 MG/DOSE,) 2 MG/3ML SOPN   tiZANidine  (ZANAFLEX ) 4 MG tablet   traMADol  (ULTRAM ) 50 MG tablet   venlafaxine  XR (EFFEXOR -XR) 150 MG 24 hr capsule   No current facility-administered medications for this encounter.    Isaiah Ruder, PA-C Surgical Short Stay/Anesthesiology [facility] Phone [phone] [facility] Phone [phone] 12/08/2023 3:28 PM

## 2023-12-08 NOTE — Anesthesia Preprocedure Evaluation (Addendum)
 Anesthesia Evaluation  Patient identified by MRN, date of birth, ID band Patient awake    Reviewed: Allergy & Precautions, NPO status , Patient's Chart, lab work & pertinent test results  History of Anesthesia Complications (+) PONV and history of anesthetic complications  Airway Mallampati: II  TM Distance: >3 FB Neck ROM: Full    Dental  (+) Teeth Intact, Dental Advisory Given   Pulmonary shortness of breath, former smoker   breath sounds clear to auscultation       Cardiovascular hypertension, Pt. on medications  Rhythm:Regular     Neuro/Psych  PSYCHIATRIC DISORDERS Anxiety Depression     Neuromuscular disease    GI/Hepatic Neg liver ROS,GERD  ,,  Endo/Other  diabetes    Renal/GU negative Renal ROS     Musculoskeletal  (+) Arthritis ,  Fibromyalgia -  Abdominal   Peds  Hematology negative hematology ROS (+)   Anesthesia Other Findings   Reproductive/Obstetrics                             Anesthesia Physical Anesthesia Plan  ASA: 2  Anesthesia Plan: General and Regional   Post-op Pain Management: Regional block*   Induction: Intravenous  PONV Risk Score and Plan: 4 or greater and Ondansetron  and Dexamethasone   Airway Management Planned: Oral ETT  Additional Equipment: None  Intra-op Plan:   Post-operative Plan: Extubation in OR  Informed Consent: I have reviewed the patients History and Physical, chart, labs and discussed the procedure including the risks, benefits and alternatives for the proposed anesthesia with the patient or authorized representative who has indicated his/her understanding and acceptance.     Dental advisory given  Plan Discussed with: CRNA  Anesthesia Plan Comments: (PAT note written 12/08/2023 by Allison Zelenak, PA-C.  )       Anesthesia Quick Evaluation

## 2023-12-09 ENCOUNTER — Encounter: Payer: Self-pay | Admitting: Home Modifications

## 2023-12-13 ENCOUNTER — Telehealth: Payer: Self-pay | Admitting: *Deleted

## 2023-12-13 NOTE — Care Plan (Signed)
 RNCM call to patient to discuss her upcoming Right reverse shoulder arthroplasty with Dr. Addie on 12/14/23 at [facility]. She is agreeable to case management. She lives with her spouse and grand-daughter, who will be able to assist her at home after discharge. Her son and DIL have also hired a paid caregiver to come every other day for the first week or two she states. She opted not to choose chair CPM through [employer], but will be provided a sling at hospital after surgery. Anticipate OPPT will be needed after first 2 weeks. RNCM will assist with this referral. Reviewed post op instructions. Will continue to assist with needs.

## 2023-12-13 NOTE — Telephone Encounter (Signed)
 Ortho bundle pre-op call completed.

## 2023-12-14 ENCOUNTER — Encounter (HOSPITAL_COMMUNITY)
Admission: RE | Disposition: A | Discharge status: Home / Self Care | Payer: Self-pay | Source: Home / Self Care | Attending: Orthopedic Surgery

## 2023-12-14 ENCOUNTER — Ambulatory Visit (HOSPITAL_BASED_OUTPATIENT_CLINIC_OR_DEPARTMENT_OTHER): Payer: PPO

## 2023-12-14 ENCOUNTER — Other Ambulatory Visit: Payer: Self-pay

## 2023-12-14 ENCOUNTER — Ambulatory Visit (HOSPITAL_COMMUNITY): Payer: PPO | Admitting: Vascular Surgery

## 2023-12-14 ENCOUNTER — Observation Stay (HOSPITAL_COMMUNITY): Payer: PPO

## 2023-12-14 ENCOUNTER — Encounter (HOSPITAL_COMMUNITY): Payer: Self-pay | Admitting: Orthopedic Surgery

## 2023-12-14 ENCOUNTER — Observation Stay (HOSPITAL_COMMUNITY)
Admission: RE | Admit: 2023-12-14 | Discharge: 2023-12-15 | Disposition: A | Discharge status: Home / Self Care | Payer: PPO | Attending: Orthopedic Surgery | Admitting: Orthopedic Surgery

## 2023-12-14 DIAGNOSIS — E119 Type 2 diabetes mellitus without complications: Secondary | ICD-10-CM | POA: Insufficient documentation

## 2023-12-14 DIAGNOSIS — Z96642 Presence of left artificial hip joint: Secondary | ICD-10-CM | POA: Insufficient documentation

## 2023-12-14 DIAGNOSIS — Z79899 Other long term (current) drug therapy: Secondary | ICD-10-CM | POA: Insufficient documentation

## 2023-12-14 DIAGNOSIS — M19011 Primary osteoarthritis, right shoulder: Secondary | ICD-10-CM | POA: Diagnosis not present

## 2023-12-14 DIAGNOSIS — Z87891 Personal history of nicotine dependence: Secondary | ICD-10-CM | POA: Diagnosis not present

## 2023-12-14 DIAGNOSIS — Z853 Personal history of malignant neoplasm of breast: Secondary | ICD-10-CM | POA: Diagnosis not present

## 2023-12-14 DIAGNOSIS — I10 Essential (primary) hypertension: Secondary | ICD-10-CM | POA: Insufficient documentation

## 2023-12-14 DIAGNOSIS — Z7984 Long term (current) use of oral hypoglycemic drugs: Secondary | ICD-10-CM | POA: Diagnosis not present

## 2023-12-14 DIAGNOSIS — E118 Type 2 diabetes mellitus with unspecified complications: Secondary | ICD-10-CM

## 2023-12-14 DIAGNOSIS — G8918 Other acute postprocedural pain: Secondary | ICD-10-CM | POA: Diagnosis not present

## 2023-12-14 DIAGNOSIS — Z01818 Encounter for other preprocedural examination: Principal | ICD-10-CM

## 2023-12-14 DIAGNOSIS — Z96611 Presence of right artificial shoulder joint: Secondary | ICD-10-CM

## 2023-12-14 DIAGNOSIS — M19019 Primary osteoarthritis, unspecified shoulder: Secondary | ICD-10-CM | POA: Diagnosis present

## 2023-12-14 HISTORY — PX: BICEPT TENODESIS: SHX5116

## 2023-12-14 HISTORY — PX: REVERSE SHOULDER ARTHROPLASTY: SHX5054

## 2023-12-14 LAB — URINALYSIS, W/ REFLEX TO CULTURE (INFECTION SUSPECTED)
Bilirubin Urine: NEGATIVE
Glucose, UA: >=500 mg/dL — AB
Hgb urine dipstick: NEGATIVE
Ketones, ur: NEGATIVE mg/dL
Nitrite: NEGATIVE
Protein, ur: NEGATIVE mg/dL
Specific Gravity, Urine: 1.015 (ref 1.005–1.030)
pH: 5.5 (ref 5.0–8.0)

## 2023-12-14 LAB — GLUCOSE, CAPILLARY
Glucose-Capillary: 156 mg/dL — ABNORMAL HIGH (ref 70–99)
Glucose-Capillary: 139 mg/dL — ABNORMAL HIGH (ref 70–99)
Glucose-Capillary: 140 mg/dL — ABNORMAL HIGH (ref 70–99)
Glucose-Capillary: 214 mg/dL — ABNORMAL HIGH (ref 70–99)

## 2023-12-14 SURGERY — ARTHROPLASTY, SHOULDER, TOTAL, REVERSE
Anesthesia: Regional | Site: Shoulder | Laterality: Right

## 2023-12-14 MED ORDER — FENTANYL CITRATE (PF) 100 MCG/2ML IJ SOLN: 50.0000 ug | Freq: Once | INTRAMUSCULAR | Status: AC

## 2023-12-14 MED ORDER — INSULIN ASPART 100 UNIT/ML IJ SOLN
0.0000 [IU] | Freq: Three times a day (TID) | INTRAMUSCULAR | Status: DC
Start: 2023-12-15 — End: 2023-12-14

## 2023-12-14 MED ORDER — DULOXETINE HCL 30 MG PO CPEP
60.0000 mg | ORAL_CAPSULE | Freq: Every day | ORAL | Status: DC
Start: 2023-12-15 — End: 2023-12-14

## 2023-12-14 MED ORDER — MIDAZOLAM HCL 2 MG/2ML IJ SOLN
INTRAMUSCULAR | Status: AC
  Filled 2023-12-14: qty 2

## 2023-12-14 MED ORDER — LIDOCAINE 2% (20 MG/ML) 5 ML SYRINGE
INTRAMUSCULAR | Status: DC | PRN
  Administered 2023-12-14: 50 mg via INTRAVENOUS

## 2023-12-14 MED ORDER — SUGAMMADEX SODIUM 200 MG/2ML IV SOLN
INTRAVENOUS | Status: DC | PRN
  Administered 2023-12-14 (×2): 200 mg via INTRAVENOUS

## 2023-12-14 MED ORDER — 0.9 % SODIUM CHLORIDE (POUR BTL) OPTIME
TOPICAL | Status: DC | PRN
  Administered 2023-12-14: 1000 mL

## 2023-12-14 MED ORDER — OXYCODONE HCL 5 MG PO TABS: 5.0000 mg | ORAL_TABLET | ORAL | Status: DC | PRN

## 2023-12-14 MED ORDER — GLYCOPYRROLATE PF 0.2 MG/ML IJ SOSY
PREFILLED_SYRINGE | INTRAMUSCULAR | Status: AC
  Filled 2023-12-14: qty 1

## 2023-12-14 MED ORDER — METOCLOPRAMIDE HCL 5 MG PO TABS: 5.0000 mg | ORAL_TABLET | Freq: Three times a day (TID) | ORAL | Status: DC | PRN

## 2023-12-14 MED ORDER — TIZANIDINE HCL 4 MG PO TABS
4.0000 mg | ORAL_TABLET | Freq: Every day | ORAL | Status: DC
Start: 2023-12-14 — End: 2023-12-15
  Administered 2023-12-14: 4 mg via ORAL
  Filled 2023-12-14: qty 1

## 2023-12-14 MED ORDER — FENTANYL CITRATE (PF) 250 MCG/5ML IJ SOLN
INTRAMUSCULAR | Status: DC | PRN
  Administered 2023-12-14 (×2): 50 ug via INTRAVENOUS

## 2023-12-14 MED ORDER — MENTHOL 3 MG MT LOZG: 1.0000 | LOZENGE | OROMUCOSAL | Status: DC | PRN

## 2023-12-14 MED ORDER — METHOCARBAMOL 1000 MG/10ML IJ SOLN: 500.0000 mg | Freq: Four times a day (QID) | INTRAMUSCULAR | Status: DC | PRN

## 2023-12-14 MED ORDER — EPHEDRINE SULFATE-NACL 50-0.9 MG/10ML-% IV SOSY
PREFILLED_SYRINGE | INTRAVENOUS | Status: DC | PRN
  Administered 2023-12-14: 10 mg via INTRAVENOUS

## 2023-12-14 MED ORDER — HYDROMORPHONE HCL 1 MG/ML IJ SOLN: 0.5000 mg | INTRAMUSCULAR | Status: DC | PRN

## 2023-12-14 MED ORDER — ACETAMINOPHEN 500 MG PO TABS
ORAL_TABLET | ORAL | Status: AC
  Filled 2023-12-14: qty 2

## 2023-12-14 MED ORDER — ACETAMINOPHEN 325 MG PO TABS: 325.0000 mg | ORAL_TABLET | Freq: Four times a day (QID) | ORAL | Status: DC | PRN

## 2023-12-14 MED ORDER — METOCLOPRAMIDE HCL 5 MG/ML IJ SOLN: 5.0000 mg | Freq: Three times a day (TID) | INTRAMUSCULAR | Status: DC | PRN

## 2023-12-14 MED ORDER — OXYCODONE HCL 5 MG PO TABS
5.0000 mg | ORAL_TABLET | ORAL | Status: DC | PRN
  Administered 2023-12-14 – 2023-12-15 (×3): 5 mg via ORAL
  Filled 2023-12-14 (×3): qty 1

## 2023-12-14 MED ORDER — METFORMIN HCL 500 MG PO TABS
1000.0000 mg | ORAL_TABLET | Freq: Two times a day (BID) | ORAL | Status: DC
  Administered 2023-12-15: 1000 mg via ORAL
  Filled 2023-12-14: qty 2

## 2023-12-14 MED ORDER — INSULIN ASPART 100 UNIT/ML IJ SOLN
0.0000 [IU] | Freq: Three times a day (TID) | INTRAMUSCULAR | Status: DC
  Administered 2023-12-15: 5 [IU] via SUBCUTANEOUS

## 2023-12-14 MED ORDER — GLYCOPYRROLATE PF 0.2 MG/ML IJ SOSY
PREFILLED_SYRINGE | INTRAMUSCULAR | Status: DC | PRN
  Administered 2023-12-14: .2 mg via INTRAVENOUS

## 2023-12-14 MED ORDER — INSULIN ASPART 100 UNIT/ML IJ SOLN
4.0000 [IU] | Freq: Three times a day (TID) | INTRAMUSCULAR | Status: DC
  Administered 2023-12-15: 4 [IU] via SUBCUTANEOUS

## 2023-12-14 MED ORDER — ASPIRIN 81 MG PO TBEC
81.0000 mg | DELAYED_RELEASE_TABLET | Freq: Every day | ORAL | Status: DC
  Administered 2023-12-14 – 2023-12-15 (×2): 81 mg via ORAL
  Filled 2023-12-14 (×2): qty 1

## 2023-12-14 MED ORDER — ROCURONIUM BROMIDE 10 MG/ML (PF) SYRINGE
PREFILLED_SYRINGE | INTRAVENOUS | Status: DC | PRN
  Administered 2023-12-14: 50 mg via INTRAVENOUS

## 2023-12-14 MED ORDER — DULOXETINE HCL 30 MG PO CPEP
60.0000 mg | ORAL_CAPSULE | Freq: Every day | ORAL | Status: DC
  Administered 2023-12-14: 60 mg via ORAL
  Filled 2023-12-14: qty 2

## 2023-12-14 MED ORDER — CHLORHEXIDINE GLUCONATE 0.12 % MT SOLN
15.0000 mL | Freq: Once | OROMUCOSAL | Status: AC
  Administered 2023-12-14: 15 mL via OROMUCOSAL
  Filled 2023-12-14: qty 15

## 2023-12-14 MED ORDER — CEFAZOLIN SODIUM-DEXTROSE 2-4 GM/100ML-% IV SOLN
2.0000 g | INTRAVENOUS | Status: AC
  Administered 2023-12-14: 2 g via INTRAVENOUS
  Filled 2023-12-14: qty 100

## 2023-12-14 MED ORDER — IRRISEPT - 450ML BOTTLE WITH 0.05% CHG IN STERILE WATER, USP 99.95% OPTIME
TOPICAL | Status: DC | PRN
  Administered 2023-12-14: 450 mL via TOPICAL

## 2023-12-14 MED ORDER — POVIDONE-IODINE 10 % EX SWAB
2.0000 | Freq: Once | CUTANEOUS | Status: AC
  Administered 2023-12-14: 2 via TOPICAL

## 2023-12-14 MED ORDER — METFORMIN HCL 500 MG PO TABS: 1000.0000 mg | ORAL_TABLET | Freq: Two times a day (BID) | ORAL | Status: DC

## 2023-12-14 MED ORDER — PHENYLEPHRINE HCL-NACL 20-0.9 MG/250ML-% IV SOLN
INTRAVENOUS | Status: DC | PRN
  Administered 2023-12-14: 40 ug/min via INTRAVENOUS

## 2023-12-14 MED ORDER — PHENYLEPHRINE 80 MCG/ML (10ML) SYRINGE FOR IV PUSH (FOR BLOOD PRESSURE SUPPORT)
PREFILLED_SYRINGE | INTRAVENOUS | Status: DC | PRN
  Administered 2023-12-14: 80 ug via INTRAVENOUS
  Administered 2023-12-14: 40 ug via INTRAVENOUS
  Administered 2023-12-14: 240 ug via INTRAVENOUS
  Administered 2023-12-14 (×2): 80 ug via INTRAVENOUS
  Administered 2023-12-14: 40 ug via INTRAVENOUS
  Administered 2023-12-14 (×2): 80 ug via INTRAVENOUS
  Administered 2023-12-14: 160 ug via INTRAVENOUS

## 2023-12-14 MED ORDER — SENNA 8.6 MG PO TABS
1.0000 | ORAL_TABLET | Freq: Two times a day (BID) | ORAL | Status: DC | PRN
  Administered 2023-12-14: 8.6 mg via ORAL
  Filled 2023-12-14: qty 1

## 2023-12-14 MED ORDER — BUSPIRONE HCL 15 MG PO TABS
7.5000 mg | ORAL_TABLET | Freq: Two times a day (BID) | ORAL | Status: DC
  Administered 2023-12-14 – 2023-12-15 (×2): 7.5 mg via ORAL
  Filled 2023-12-14 (×2): qty 1

## 2023-12-14 MED ORDER — BUPIVACAINE LIPOSOME 1.3 % IJ SUSP
INTRAMUSCULAR | Status: DC | PRN
  Administered 2023-12-14: 133 mg via PERINEURAL

## 2023-12-14 MED ORDER — VANCOMYCIN HCL 1000 MG IV SOLR
INTRAVENOUS | Status: DC | PRN
  Administered 2023-12-14: 1000 mg via TOPICAL

## 2023-12-14 MED ORDER — INSULIN ASPART 100 UNIT/ML IJ SOLN: 0.0000 [IU] | INTRAMUSCULAR | Status: DC | PRN

## 2023-12-14 MED ORDER — FENTANYL CITRATE (PF) 250 MCG/5ML IJ SOLN
INTRAMUSCULAR | Status: AC
  Filled 2023-12-14: qty 5

## 2023-12-14 MED ORDER — ONDANSETRON HCL 4 MG PO TABS: 4.0000 mg | ORAL_TABLET | Freq: Four times a day (QID) | ORAL | Status: DC | PRN

## 2023-12-14 MED ORDER — TRANEXAMIC ACID-NACL 1000-0.7 MG/100ML-% IV SOLN
1000.0000 mg | INTRAVENOUS | Status: AC
  Administered 2023-12-14: 1000 mg via INTRAVENOUS
  Filled 2023-12-14: qty 100

## 2023-12-14 MED ORDER — DOCUSATE SODIUM 100 MG PO CAPS
100.0000 mg | ORAL_CAPSULE | Freq: Two times a day (BID) | ORAL | Status: DC
  Administered 2023-12-14 – 2023-12-15 (×2): 100 mg via ORAL
  Filled 2023-12-14 (×2): qty 1

## 2023-12-14 MED ORDER — LOSARTAN POTASSIUM 50 MG PO TABS: 100.0000 mg | ORAL_TABLET | Freq: Every day | ORAL | Status: DC

## 2023-12-14 MED ORDER — VENLAFAXINE HCL ER 75 MG PO CP24
150.0000 mg | ORAL_CAPSULE | Freq: Every day | ORAL | Status: DC
  Administered 2023-12-15: 150 mg via ORAL
  Filled 2023-12-14: qty 2

## 2023-12-14 MED ORDER — POVIDONE-IODINE 7.5 % EX SOLN
Freq: Once | CUTANEOUS | Status: AC
  Filled 2023-12-14: qty 118

## 2023-12-14 MED ORDER — CEFAZOLIN SODIUM-DEXTROSE 2-4 GM/100ML-% IV SOLN
2.0000 g | Freq: Three times a day (TID) | INTRAVENOUS | Status: DC
  Administered 2023-12-14 – 2023-12-15 (×2): 2 g via INTRAVENOUS
  Filled 2023-12-14 (×2): qty 100

## 2023-12-14 MED ORDER — DEXAMETHASONE SODIUM PHOSPHATE 10 MG/ML IJ SOLN
INTRAMUSCULAR | Status: DC | PRN
  Administered 2023-12-14: 8 mg via INTRAVENOUS

## 2023-12-14 MED ORDER — LACTATED RINGERS IV SOLN: INTRAVENOUS | Status: DC

## 2023-12-14 MED ORDER — ONDANSETRON HCL 4 MG/2ML IJ SOLN
INTRAMUSCULAR | Status: DC | PRN
  Administered 2023-12-14: 4 mg via INTRAVENOUS

## 2023-12-14 MED ORDER — FAMOTIDINE 20 MG PO TABS
40.0000 mg | ORAL_TABLET | Freq: Every morning | ORAL | Status: DC
  Administered 2023-12-15: 40 mg via ORAL
  Filled 2023-12-14: qty 2

## 2023-12-14 MED ORDER — ORAL CARE MOUTH RINSE: 15.0000 mL | Freq: Once | OROMUCOSAL | Status: AC

## 2023-12-14 MED ORDER — INSULIN ASPART 100 UNIT/ML IJ SOLN: 4.0000 [IU] | Freq: Three times a day (TID) | INTRAMUSCULAR | Status: DC

## 2023-12-14 MED ORDER — FAMOTIDINE 20 MG PO TABS: 40.0000 mg | ORAL_TABLET | Freq: Every morning | ORAL | Status: DC

## 2023-12-14 MED ORDER — METHOCARBAMOL 500 MG PO TABS: 500.0000 mg | ORAL_TABLET | Freq: Four times a day (QID) | ORAL | Status: DC | PRN

## 2023-12-14 MED ORDER — ONDANSETRON HCL 4 MG/2ML IJ SOLN
4.0000 mg | Freq: Four times a day (QID) | INTRAMUSCULAR | Status: DC | PRN
Start: 2023-12-14 — End: 2023-12-15

## 2023-12-14 MED ORDER — FENTANYL CITRATE (PF) 100 MCG/2ML IJ SOLN
INTRAMUSCULAR | Status: AC
  Administered 2023-12-14: 50 ug via INTRAVENOUS
  Filled 2023-12-14: qty 2

## 2023-12-14 MED ORDER — ACETAMINOPHEN 500 MG PO TABS
1000.0000 mg | ORAL_TABLET | Freq: Four times a day (QID) | ORAL | Status: DC
  Administered 2023-12-14 – 2023-12-15 (×2): 1000 mg via ORAL
  Filled 2023-12-14 (×2): qty 2

## 2023-12-14 MED ORDER — VANCOMYCIN HCL 1000 MG IV SOLR
INTRAVENOUS | Status: AC
  Filled 2023-12-14: qty 20

## 2023-12-14 MED ORDER — PHENOL 1.4 % MT LIQD: 1.0000 | OROMUCOSAL | Status: DC | PRN

## 2023-12-14 MED ORDER — BUPIVACAINE-EPINEPHRINE (PF) 0.5% -1:200000 IJ SOLN
INTRAMUSCULAR | Status: DC | PRN
  Administered 2023-12-14: 15 mL via PERINEURAL

## 2023-12-14 MED ORDER — ACETAMINOPHEN 500 MG PO TABS
1000.0000 mg | ORAL_TABLET | Freq: Once | ORAL | Status: AC
  Administered 2023-12-14: 1000 mg via ORAL

## 2023-12-14 MED ORDER — VENLAFAXINE HCL ER 75 MG PO CP24: 150.0000 mg | ORAL_CAPSULE | Freq: Every day | ORAL | Status: DC

## 2023-12-14 MED ORDER — PROPOFOL 10 MG/ML IV BOLUS
INTRAVENOUS | Status: DC | PRN
  Administered 2023-12-14: 100 mg via INTRAVENOUS

## 2023-12-14 SURGICAL SUPPLY — 66 items
ALCOHOL 70% 16 OZ (MISCELLANEOUS) ×1 IMPLANT
AUG COMP REV MI TAPER ADAPTER (Joint) ×1 IMPLANT
AUGMENT COMP REV MI TAPR ADPTR (Joint) IMPLANT
BAG COUNTER SPONGE SURGICOUNT (BAG) ×1 IMPLANT
BIT DRILL 2.7 W/STOP DISP (BIT) IMPLANT
BIT DRILL TWIST 2.7 (BIT) IMPLANT
BLADE SAW SGTL 13X75X1.27 (BLADE) ×1 IMPLANT
CHLORAPREP W/TINT 26 (MISCELLANEOUS) ×1 IMPLANT
COOLER ICEMAN CLASSIC (MISCELLANEOUS) ×1 IMPLANT
COVER SURGICAL LIGHT HANDLE (MISCELLANEOUS) ×1 IMPLANT
DRAPE INCISE IOBAN 66X45 STRL (DRAPES) ×1 IMPLANT
DRAPE SURG ORHT 6 SPLT 77X108 (DRAPES) IMPLANT
DRAPE U-SHAPE 47X51 STRL (DRAPES) ×2 IMPLANT
DRSG AQUACEL AG ADV 3.5X10 (GAUZE/BANDAGES/DRESSINGS) ×1 IMPLANT
ELECT BLADE 4.0 EZ CLEAN MEGAD (MISCELLANEOUS) ×1
ELECT REM PT RETURN 9FT ADLT (ELECTROSURGICAL) ×1
ELECTRODE BLDE 4.0 EZ CLN MEGD (MISCELLANEOUS) ×1 IMPLANT
ELECTRODE REM PT RTRN 9FT ADLT (ELECTROSURGICAL) ×1 IMPLANT
GAUZE SPONGE 4X4 12PLY STRL LF (GAUZE/BANDAGES/DRESSINGS) ×1 IMPLANT
GLENOID SPHERE 36+6 (Joint) IMPLANT
GLOVE BIOGEL PI IND STRL 6.5 (GLOVE) ×1 IMPLANT
GLOVE BIOGEL PI IND STRL 8 (GLOVE) ×1 IMPLANT
GLOVE ECLIPSE 6.5 STRL STRAW (GLOVE) ×1 IMPLANT
GLOVE ECLIPSE 8.0 STRL XLNG CF (GLOVE) ×1 IMPLANT
GOWN STRL REUS W/ TWL LRG LVL3 (GOWN DISPOSABLE) ×1 IMPLANT
GOWN STRL REUS W/ TWL XL LVL3 (GOWN DISPOSABLE) ×1 IMPLANT
GUIDE BONE RSA SHLD ROT RT (ORTHOPEDIC DISPOSABLE SUPPLIES) IMPLANT
HYDROGEN PEROXIDE 16OZ (MISCELLANEOUS) ×1 IMPLANT
JET LAVAGE IRRISEPT WOUND (IRRIGATION / IRRIGATOR) ×1
KIT BASIN OR (CUSTOM PROCEDURE TRAY) ×1 IMPLANT
KIT TURNOVER KIT B (KITS) ×1 IMPLANT
LAVAGE JET IRRISEPT WOUND (IRRIGATION / IRRIGATOR) ×1 IMPLANT
MANIFOLD NEPTUNE II (INSTRUMENTS) ×1 IMPLANT
NDL SUT 6 .5 CRC .975X.05 MAYO (NEEDLE) IMPLANT
NS IRRIG 1000ML POUR BTL (IV SOLUTION) ×1 IMPLANT
PACK SHOULDER (CUSTOM PROCEDURE TRAY) ×1 IMPLANT
PAD COLD SHLDR WRAP-ON (PAD) ×1 IMPLANT
PIN HUMERAL STMN 3.2MMX9IN (INSTRUMENTS) IMPLANT
PIN STEINMANN THREADED TIP (PIN) IMPLANT
REAMER GUIDE BUSHING SURG DISP (MISCELLANEOUS) IMPLANT
REAMER GUIDE W/SCREW AUG (MISCELLANEOUS) IMPLANT
RESTRAINT HEAD UNIVERSAL NS (MISCELLANEOUS) ×1 IMPLANT
RETRIEVER SUT HEWSON (MISCELLANEOUS) ×1 IMPLANT
SCREW CENTRAL 6.5X20MM (Screw) IMPLANT
SCREW LOCKING 4.75MMX15MM (Screw) IMPLANT
SCREW LOCKING NS 4.75MMX20MM (Screw) IMPLANT
SLING ARM IMMOBILIZER LRG (SOFTGOODS) ×1 IMPLANT
SLING ARM IMMOBILIZER XL (CAST SUPPLIES) IMPLANT
SOL PREP POV-IOD 4OZ 10% (MISCELLANEOUS) ×1 IMPLANT
SPONGE T-LAP 18X18 ~~LOC~~+RFID (SPONGE) ×1 IMPLANT
STEM HUMERAL STRL 12MMX83MM (Stem) IMPLANT
STRIP CLOSURE SKIN 1/2X4 (GAUZE/BANDAGES/DRESSINGS) ×1 IMPLANT
SUCTION TUBE FRAZIER 10FR DISP (SUCTIONS) ×1 IMPLANT
SUT BROADBAND TAPE 2PK 1.5 (SUTURE) IMPLANT
SUT ETHILON 3 0 PS 1 (SUTURE) IMPLANT
SUT MNCRL AB 3-0 PS2 18 (SUTURE) ×1 IMPLANT
SUT MNCRL AB 3-0 PS2 27 (SUTURE) IMPLANT
SUT SILK 2 0 TIES 10X30 (SUTURE) ×1 IMPLANT
SUT VIC AB 0 CT1 27XBRD ANBCTR (SUTURE) ×4 IMPLANT
SUT VIC AB 1 CT1 27XBRD ANBCTR (SUTURE) ×2 IMPLANT
SUT VIC AB 2-0 CT1 TAPERPNT 27 (SUTURE) ×3 IMPLANT
SUT VICRYL 0 UR6 27IN ABS (SUTURE) ×2 IMPLANT
TOWEL GREEN STERILE (TOWEL DISPOSABLE) ×1 IMPLANT
TRAY HUM REV SHOULDER 36 +3 (Shoulder) IMPLANT
TRAY HUM REV SHOULDER STD +6 (Shoulder) IMPLANT
WATER STERILE IRR 1000ML POUR (IV SOLUTION) ×1 IMPLANT

## 2023-12-14 NOTE — Anesthesia Procedure Notes (Signed)
 Anesthesia Regional Block: Interscalene brachial plexus block   Pre-Anesthetic Checklist: , timeout performed,  Correct Patient, Correct Site, Correct Laterality,  Correct Procedure, Correct Position, site marked,  Risks and benefits discussed,  Surgical consent,  Pre-op evaluation,  At surgeon's request and post-op pain management  Laterality: Right and Upper  Prep: chloraprep       Needles:  Injection technique: Single-shot      Needle Length: 5cm  Needle Gauge: 22     Additional Needles: Arrow StimuQuik ECHO Echogenic Stimulating PNB Needle  Procedures:,,,, ultrasound used (permanent image in chart),,    Narrative:  Start time: 12/14/2023 2:54 PM End time: 12/14/2023 3:02 PM Injection made incrementally with aspirations every 5 mL.  Performed by: Personally  Anesthesiologist: Leopoldo Bruckner, MD

## 2023-12-14 NOTE — Anesthesia Postprocedure Evaluation (Signed)
 Anesthesia Post Note  Patient: Cassandra Johnston  Procedure(s) Performed: RIGHT REVERSE SHOULDER ARTHROPLASTY (Right: Shoulder) BICEPS TENODESIS (Right: Shoulder)     Patient location during evaluation: PACU Anesthesia Type: Regional and General Level of consciousness: awake and alert Pain management: pain level controlled Vital Signs Assessment: post-procedure vital signs reviewed and stable Respiratory status: spontaneous breathing, nonlabored ventilation, respiratory function stable and patient connected to nasal cannula oxygen Cardiovascular status: stable and blood pressure returned to baseline Postop Assessment: no apparent nausea or vomiting Anesthetic complications: no  No notable events documented.  Last Vitals:  Vitals:   12/14/23 1915 12/14/23 1930  BP: (!) 144/69 (!) 156/71  Pulse: 76 75  Resp: 19 17  Temp:    SpO2: 97% 96%    Last Pain:  Vitals:   12/14/23 1845  TempSrc:   PainSc: 0-No pain                 [name]

## 2023-12-14 NOTE — H&P (Signed)
 Cassandra Johnston is an 73 y.o. female.   Chief Complaint: Right shoulder pain HPI: Cassandra Johnston is a 72 y.o. female who presents reporting right shoulder pain.  Patient describes constant chronic pain in the right shoulder.  Pain wakes her from sleep at night.  Reports decreased range of motion as well as difficulty with ADLs.  Takes Norco 1 to 2/day without much relief.  She states that she fell at the end of the summer.  No real shoulder problem before hand.  CBGs increased after her last cortisone injection.  A1c 7.62 months ago.  She has her husband at home as well as her granddaughter who is an EMT.  She has also had a CT scan which shows a subcortical fracture along the upper articular medial margin of the humeral head with glenohumeral joint effusion and some fluid reaching into the subcoracoid bursa.  There is significant degenerative chondral thinning and spurring of the glenoid and humeral head..    Past Medical History:  Diagnosis Date   Anxiety    Arthritis    Breast cancer (HCC) 2010   left   Breast infection    left breast   Chest pain 09/24/2015   Complication of anesthesia    patient states she becomes combative with anesthesia with some surgeries   Depression    Diabetes mellitus    Diverticulosis    Esophageal stricture    Essential hypertension 09/07/2019   Family history of adverse reaction to anesthesia    father was also combative per patient report    Fibromyalgia    GERD (gastroesophageal reflux disease)    H/O lymph node biopsy - abnl follicles /B cells 09/07/2019   Dr. Layla: at risk for developing CLL.   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, estrogen receptor positive (HCC) 10/13/2019   Osteoarthritis of left hip 07/17/2016   Osteoporosis 09/07/2019   Palpitations 09/24/2015   Personal history of radiation therapy    Pneumonia    hx of     PONV (postoperative nausea and vomiting)    Shortness of breath 09/24/2015   weight related per patient    Tremor     Past Surgical History:  Procedure Laterality Date   ABDOMINAL HYSTERECTOMY     APPENDECTOMY     BREAST LUMPECTOMY  2010   CHOLECYSTECTOMY     COLONOSCOPY  04/30/2009   [facility]   CYSTECTOMY     on thyroid    FRACTURE SURGERY     feet   HAND SURGERY     TONSILLECTOMY     TOTAL HIP ARTHROPLASTY Left 07/17/2016   Procedure: LEFT TOTAL HIP ARTHROPLASTY ANTERIOR APPROACH;  Surgeon: Lonni CINDERELLA Poli, MD;  Location: [facility];  Service: Orthopedics;  Laterality: Left;   TUBAL LIGATION      Family History  Problem Relation Age of Onset   Diabetes Mother    Colon cancer Mother 61   Atrial fibrillation Father    CAD Father    Pancreatic cancer Father    Colon cancer Maternal Great-grandmother    Colon polyps Neg Hx    Esophageal cancer Neg Hx    Rectal cancer Neg Hx    Stomach cancer Neg Hx    Social History:  reports that she quit smoking about 20 years ago. Her smoking use included cigarettes. She has never used smokeless tobacco. She reports  that she does not drink alcohol and does not use drugs.  Allergies:  Allergies  Allergen Reactions   Naproxen Other (See Comments)    GI   Sulfa Antibiotics Rash    Medications Prior to Admission  Medication Sig Dispense Refill   acetaminophen  (TYLENOL ) 500 MG tablet Take 500-1,000 mg by mouth every 6 (six) hours as needed (pain.).     Biotin 5000 MCG TABS Take 10,000 mcg by mouth in the morning.     busPIRone  (BUSPAR ) 7.5 MG tablet TAKE 1 TABLET(7.5 MG) BY MOUTH TWICE DAILY 180 tablet 3   Cholecalciferol (D-3-5) 125 MCG (5000 UT) capsule Take 5,000 Units by mouth in the morning.     dimenhyDRINATE (DRAMAMINE) 50 MG tablet Take 50 mg by mouth every 8 (eight) hours as needed (dizziness/vertigo).     DULoxetine  (CYMBALTA ) 60 MG capsule Take 1 capsule (60 mg total) by mouth at bedtime.     empagliflozin (JARDIANCE)  25 MG TABS tablet Take 25 mg by mouth in the morning.     famotidine  (PEPCID ) 20 MG tablet Take 40 mg by mouth in the morning.     insulin  glargine, 1 Unit Dial, (TOUJEO  SOLOSTAR) 300 UNIT/ML Solostar Pen Inject 45 Units into the skin at bedtime.     losartan  (COZAAR ) 100 MG tablet Take 1 tablet (100 mg total) by mouth daily. 90 tablet 3   metFORMIN  (GLUCOPHAGE ) 500 MG tablet Take 1,000 mg by mouth 2 (two) times daily.     rosuvastatin  (CRESTOR ) 10 MG tablet TAKE 1 TABLET(10 MG) BY MOUTH AT BEDTIME 90 tablet 3   Semaglutide,0.25 or 0.5MG /DOS, (OZEMPIC, 0.25 OR 0.5 MG/DOSE,) 2 MG/3ML SOPN Inject 1 mg into the skin every Monday.     tiZANidine  (ZANAFLEX ) 4 MG tablet TAKE 1 TABLET(4 MG) BY MOUTH TWICE DAILY AS NEEDED FOR MUSCLE SPASMS (Patient taking differently: Take 4 mg by mouth at bedtime.) 180 tablet 3   traMADol  (ULTRAM ) 50 MG tablet 1 po tid prn pain 30 tablet 0   venlafaxine  XR (EFFEXOR -XR) 150 MG 24 hr capsule Take 1 capsule (150 mg total) by mouth daily with breakfast. 90 capsule 3   cephALEXin  (KEFLEX ) 500 MG capsule Take 1 capsule (500 mg total) by mouth 2 (two) times daily. 10 capsule 0   ONETOUCH ULTRA test strip TEST TID      Results for orders placed or performed during the hospital encounter of 12/14/23 (from the past 48 hours)  Urinalysis, w/ Reflex to Culture (Infection Suspected) -Urine, Clean Catch     Status: Abnormal   Collection Time: 12/14/23  1:20 PM  Result Value Ref Range   Specimen Source URINE, CLEAN CATCH    Color, Urine YELLOW YELLOW    Comment: MICROSCOPIC EXAM PERFORMED ON UNCONCENTRATED URINE LESS THAN 10 mL OF URINE SUBMITTED    APPearance CLEAR CLEAR   Specific Gravity, Urine 1.015 1.005 - 1.030   pH 5.5 5.0 - 8.0   Glucose, UA >=500 (A) NEGATIVE mg/dL   Hgb urine dipstick NEGATIVE NEGATIVE   Bilirubin Urine NEGATIVE NEGATIVE   Ketones, ur NEGATIVE NEGATIVE mg/dL   Protein, ur NEGATIVE NEGATIVE mg/dL   Nitrite NEGATIVE NEGATIVE   Leukocytes,Ua TRACE  (A) NEGATIVE   Squamous Epithelial / HPF 0-5 0 - 5 /HPF   WBC, UA 0-5 0 - 5 WBC/hpf    Comment: Reflex urine culture not performed if WBC <=10, OR if Squamous epithelial cells >5. If Squamous epithelial cells >5, suggest recollection.   RBC / HPF  0-5 0 - 5 RBC/hpf   Bacteria, UA FEW (A) NONE SEEN    Comment: Performed at [facility], [address], [place], [place] [address]  Glucose, capillary     Status: Abnormal   Collection Time: 12/14/23  1:24 PM  Result Value Ref Range   Glucose-Capillary 156 (H) 70 - 99 mg/dL    Comment: Glucose reference range applies only to samples taken after fasting for at least 8 hours.   No results found.  Review of Systems  Musculoskeletal:  Positive for arthralgias.  All other systems reviewed and are negative.   Blood pressure 114/68, pulse 64, temperature 97.9 F (36.6 C), temperature source Oral, resp. rate 16, height 5' 4 (1.626 m), weight 69.9 kg, SpO2 98%. Physical Exam Vitals reviewed.  HENT:     Head: Normocephalic.     Nose: Nose normal.     Mouth/Throat:     Mouth: Mucous membranes are moist.  Eyes:     Pupils: Pupils are equal, round, and reactive to light.  Cardiovascular:     Rate and Rhythm: Normal rate.     Pulses: Normal pulses.  Pulmonary:     Effort: Pulmonary effort is normal.  Abdominal:     General: Abdomen is flat.  Musculoskeletal:     Cervical back: Normal range of motion.  Skin:    General: Skin is warm.     Capillary Refill: Capillary refill takes less than 2 seconds.  Neurological:     General: No focal deficit present.     Mental Status: She is alert.  Psychiatric:        Mood and Affect: Mood normal.     Ortho exam demonstrates range of motion on the right of 20/90/140 on the left 50/105/170. Has pretty reasonable rotator cuff strength infraspinatus supraspinatus and subscap muscle testing although it is slightly weaker at 5- out of 5 to external rotation on the right compared to the left. Coarse  grinding and popping is present on the right-hand side with passive range of motion. Neck range of motion intact. Deltoid is functional. EPL FPL interosseous strength is intact with palpable radial pulse.  Assessment/Plan Impression is right shoulder humeral head fracture and arthritis. This is a subchondral type fracture with worsening symptoms. Range of motion is worsening. Plan thin cut CT scan 1 mm cuts to evaluate for preop reverse shoulder replacement. Tramadol  prescribed. The risk and benefits of shoulder replacement are discussed with the patient including not limited to infection nerve vessel damage incomplete pain relief as well as incomplete restoration of function. Patient understands the risk and benefits. That includes dislocation as well. All questions answered. Plan for surgery as soon as imaging and preoperative patient's specific instrumentation can be obtained. All questions answered   KANDICE Glendia Hutchinson, MD 12/14/2023, 2:36 PM

## 2023-12-14 NOTE — Op Note (Signed)
 NAME: Cassandra Johnston, Cassandra Johnston MEDICAL RECORD NO: [id number] ACCOUNT NO: [id number] DATE OF BIRTH: [birth year] FACILITY: [facility] LOCATION: [facility] PHYSICIAN: Cordella RAMAN. Addie, MD  Operative Report   DATE OF PROCEDURE: 12/14/2023  PREOPERATIVE DIAGNOSIS:  Right shoulder arthritis.  POSTOPERATIVE DIAGNOSIS:  Right shoulder arthritis.  PROCEDURE:  Right reverse shoulder replacement.  SURGEON:  Cordella RAMAN.  Addie, MD  ASSISTANT:  Herlene Calix, PA  COMPONENTS UTILIZED: Include Biomet Comprehensive Small Augmented Baseplate with 36+6 glenosphere, one central compression screw, three peripheral locking screws, and size 12 mini humeral stem with +3 36 mm highly cross-linked polyethylene bearing on a  +6 tapered offset standard thickness humeral trays.  INDICATIONS:  This is a 73 year old patient with end-stage arthritis who presents for operative management after explanation of the risks and benefits.  DESCRIPTION OF PROCEDURE:  The patient was brought to the operating room where general anesthetic was induced.  Preoperative antibiotics were administered.  Timeout was called.  The patient was placed on the beach chair position with the head in neutral  position.  Right shoulder, arm, and hand were then prescrubbed with hydrogen peroxide followed by alcohol and then Betadine , which was allowed to air dry and then ChloraPrep solution.  Ioban was used to cover the entire operative field.  After calling  timeout, deltopectoral approach was made.  IrriSept solution was utilized.  Cephalic vein mobilized laterally.  Subdeltoid and subacromial adhesions were released.  Anterior attachment of the deltoid was elevated manually off of its attachment.  The  superior 1.5 cm of the pectoralis was released.  The biceps tendon was tenodesed using 5-0 Vicryl sutures.  Axillary nerve was palpated and protected at all times during the case.  Kolbel retractor was placed.  Subscapularis was detached from the lesser  tuberosity  using a #15 blade and 2-0 Vicryl traction sutures.  Prior to doing this, we did ligate the circumflex vessels.  Detachment was carried around to the 5 o'clock position elevating about 2 cm of the capsule off the inferior humeral neck.  At this  time, the head was dislocated.  The rotator interval was completely released to the base of the coracoid.  This gave very good subscapularis mobilization.  A rongeur was used.  The patient did have some partial-thickness tearing of the superior rotator  cuff.  Reaming performed up to a size 12.  The head was then cut in 30 degrees of retroversion.  Broaching then performed up to a size 12 and we left the cap on the broach.  Attention was then directed towards the glenoid.  Posterior and anterior  retractors were placed with care being taken to avoid injury to the axillary nerve.  The labrum was circumferentially excised.  Bankart lesion created from the 12 o'clock to 6 o'clock position.  Next, using the patient's specific instrumentation and  guides, the glenoid was reamed over a guide pin.  Correct reaming depth obtained preoperatively.  Small augment was then prepared for.  Trial was placed and it had very good contact.  True baseplate was placed with one central compression screw and four  peripheral locking screws with 100% contact.  Next, we did trial reduction with a +3 and +6 glenosphere.  The +6 glenosphere with 1.5 mm of superior offset gave very good stability when matched with a +6 humeral tray with +3 liner.  This was stable with  extension and adduction as well as a forward force and was also stable with internal and external rotation at 90 degrees of  abduction.  Difficult to reduce and difficult to re-dislocate.  Trial components removed.  We drilled three suture tape holes in  the lesser tuberosity for subscapularis reattachment.  IrriSept solution was utilized.  The true glenosphere was placed with 1.5 mm of superior offset.  Next, we placed the stem  into a canal, which had been irrigated with IrriSept solution, which was  then sucked out and we placed the vancomycin  in that canal as well.  The stem fit nicely.  Same stability parameters were maintained with the +6 humeral tray with +3 liner.  This true tray was placed on the dried Morse taper, which was the same case for  the glenosphere.  Very good stability was achieved.  Next, thorough irrigation was performed with 3 liters of irrigating solution.  Axillary nerve again palpated.  The subscapularis was then repaired with the arm in 30 degrees of external rotation to the  lesser tuberosity using the SutureTapes.  NICE knots were utilized.  The joint itself was then irrigated with IrriSept solution and vancomycin  powder was placed through the rotator interval onto the prosthesis.  The rotator interval was then closed in  30 degrees of external rotation using #1 Vicryl suture.  Irrigation was again performed with IrriSept solution, vancomycin  powder placed, and the deltopectoral interval was then closed using #1 Vicryl suture followed by interrupted inverted 0 Vicryl  suture, 2-0 Vicryl suture, and 3-0 Monocryl with Steri-Strips, Aquacel dressing, and a shoulder immobilizer placed.  The patient tolerated the procedure well without immediate complications.  He was transferred to the recovery room in stable condition.   Luke's assistance was required at all times for retraction, opening, closing, mobilization of tissue.  His assistance was of medical necessity.   PUS D: 12/14/2023 6:48:13 pm T: 12/14/2023 7:41:00 pm  JOB: [id number]/ [id number]

## 2023-12-14 NOTE — Transfer of Care (Signed)
 Immediate Anesthesia Transfer of Care Note  Patient: Cassandra Johnston  Procedure(s) Performed: RIGHT REVERSE SHOULDER ARTHROPLASTY (Right: Shoulder) BICEPS TENODESIS (Right: Shoulder)  Patient Location: PACU  Anesthesia Type:General  Level of Consciousness: awake, alert , and patient cooperative  Airway & Oxygen Therapy: Patient Spontanous Breathing and Patient connected to nasal cannula oxygen  Post-op Assessment: Report given to RN and Post -op Vital signs reviewed and stable  Post vital signs: Reviewed and stable  Last Vitals:  Vitals Value Taken Time  BP 126/71 12/14/23 1840  Temp 36.4 C 12/14/23 1838  Pulse 79 12/14/23 1844  Resp 19 12/14/23 1844  SpO2 92 % 12/14/23 1844  Vitals shown include unfiled device data.  Last Pain:  Vitals:   12/14/23 1838  TempSrc:   PainSc: 0-No pain      Patients Stated Pain Goal: 0 (12/14/23 1319)  Complications: No notable events documented.

## 2023-12-14 NOTE — Brief Op Note (Signed)
   12/14/2023  6:40 PM  PATIENT:  Richerd Driscilla Mania  73 y.o. female  PRE-OPERATIVE DIAGNOSIS:  right shoulder osteoarthritis  POST-OPERATIVE DIAGNOSIS:  right shoulder osteoarthritis  PROCEDURE:  Procedure(s): RIGHT REVERSE SHOULDER ARTHROPLASTY BICEPS TENODESIS  SURGEON:  Surgeon(s): Addie, Cordella Hamilton, MD  ASSISTANT: Magnant PA  ANESTHESIA:   General  EBL: 50 ml    Total I/O In: 200 [IV Piggyback:200] Out: 50 [Blood:50]  BLOOD ADMINISTERED: none  DRAINS: None  LOCAL MEDICATIONS USED: Vancomycin   SPECIMEN:  No Specimen  COUNTS:  YES  TOURNIQUET:  * No tourniquets in log *  DICTATION: .Other Dictation: Dictation Number [id number]  PLAN OF CARE: Admit for overnight observation  PATIENT DISPOSITION:  PACU - hemodynamically stable

## 2023-12-14 NOTE — Anesthesia Procedure Notes (Signed)
 Procedure Name: Intubation Date/Time: 12/14/2023 3:33 PM  Performed by: Roslynn Waddell LABOR, CRNAPre-anesthesia Checklist: Patient identified, Emergency Drugs available, Suction available and Patient being monitored Patient Re-evaluated:Patient Re-evaluated prior to induction Oxygen Delivery Method: Circle System Utilized Preoxygenation: Pre-oxygenation with 100% oxygen Induction Type: IV induction Ventilation: Mask ventilation without difficulty Laryngoscope Size: Mac and 3 Grade View: Grade I Tube type: Oral Number of attempts: 1 Airway Equipment and Method: Stylet Placement Confirmation: ETT inserted through vocal cords under direct vision, positive ETCO2 and breath sounds checked- equal and bilateral Secured at: 21 cm Tube secured with: Tape Dental Injury: Teeth and Oropharynx as per pre-operative assessment  Comments: Atraumatic induction/intubation. Dentition and oral mucosa as per preop

## 2023-12-15 ENCOUNTER — Encounter (HOSPITAL_COMMUNITY): Payer: Self-pay | Admitting: Orthopedic Surgery

## 2023-12-15 DIAGNOSIS — M19011 Primary osteoarthritis, right shoulder: Secondary | ICD-10-CM | POA: Diagnosis not present

## 2023-12-15 LAB — GLUCOSE, CAPILLARY: Glucose-Capillary: 245 mg/dL — ABNORMAL HIGH (ref 70–99)

## 2023-12-15 MED ORDER — ASPIRIN 81 MG PO TBEC: 81.0000 mg | DELAYED_RELEASE_TABLET | Freq: Every day | ORAL | 0 refills | Status: DC

## 2023-12-15 MED ORDER — DOCUSATE SODIUM 100 MG PO CAPS: 100.0000 mg | ORAL_CAPSULE | Freq: Two times a day (BID) | ORAL | 0 refills | Status: DC

## 2023-12-15 MED ORDER — OXYCODONE HCL 5 MG PO TABS: 5.0000 mg | ORAL_TABLET | ORAL | 0 refills | Status: DC | PRN

## 2023-12-15 NOTE — Progress Notes (Signed)
 Patient awaiting family for discharge home, Patient in no acute distress nor complaints of pain nor discomfort; incision on back is clean, dry and intact; No c/o pain at this time. Room was checked and accounted for all patient's belongings; discharge instructions concerning her medications, incision care, follow up appointment and when to call the doctor as needed were all discussed with patient by RN and she expressed understanding on the instructions given.

## 2023-12-15 NOTE — Progress Notes (Signed)
  Subjective: Cassandra Johnston is a 73 y.o. female s/p right RSA.  They are POD 1.  Pt's pain is controlled.  Patient denies any complaints of chest pain, shortness of breath, abdominal pain.  She reports that the block is still in effect.  She has CPM machine being delivered to her house today.  Objective: Vital signs in last 24 hours: Temp:  [97.5 F (36.4 C)-98.4 F (36.9 C)] 98.4 F (36.9 C) (01/15 0745) Pulse Rate:  [64-83] 66 (01/15 0745) Resp:  [13-19] 18 (01/15 0745) BP: (112-156)/(54-113) 112/54 (01/15 0745) SpO2:  [90 %-100 %] 95 % (01/15 0505) Weight:  [69.9 kg] 69.9 kg (01/14 1319)  Intake/Output from previous day: 01/14 0701 - 01/15 0700 In: 1680 [P.O.:480; I.V.:1000; IV Piggyback:200] Out: 50 [Blood:50] Intake/Output this shift: No intake/output data recorded.  Exam:  No gross blood or drainage overlying the dressing 2+ radial pulse of the operative extremity Postoperative physical exam somewhat limited by interscalene block but intact finger flexion.  All other motor function is still limited.   Labs: No results for input(s): "HGB" in the last 72 hours. No results for input(s): "WBC", "RBC", "HCT", "PLT" in the last 72 hours. No results for input(s): "NA", "K", "CL", "CO2", "BUN", "CREATININE", "GLUCOSE", "CALCIUM " in the last 72 hours. No results for input(s): "LABPT", "INR" in the last 72 hours.  Assessment/Plan: Pt is POD 1 s/p right RSA    -Plan to discharge to home today after patient works with occupational therapy.  -No lifting with the operative arm  -Stay in sling except for showering/sleeping and using CPM machine at home.  No lifting with the operative arm more than 1 to 2 pounds  -Follow-up with Dr. Rozelle Corning in clinic 2 weeks postoperatively     [facility] 12/15/2023, 9:17 AM

## 2023-12-15 NOTE — Plan of Care (Signed)
  Problem: Education: Goal: Knowledge of General Education information will improve Description: Including pain rating scale, medication(s)/side effects and non-pharmacologic comfort measures Outcome: Completed/Met   Problem: Health Behavior/Discharge Planning: Goal: Ability to manage health-related needs will improve Outcome: Completed/Met   Problem: Clinical Measurements: Goal: Ability to maintain clinical measurements within normal limits will improve Outcome: Completed/Met Goal: Will remain free from infection Outcome: Completed/Met Goal: Diagnostic test results will improve Outcome: Completed/Met Goal: Respiratory complications will improve Outcome: Completed/Met Goal: Cardiovascular complication will be avoided Outcome: Completed/Met   Problem: Activity: Goal: Risk for activity intolerance will decrease Outcome: Completed/Met   Problem: Nutrition: Goal: Adequate nutrition will be maintained Outcome: Completed/Met   Problem: Coping: Goal: Level of anxiety will decrease Outcome: Completed/Met   Problem: Elimination: Goal: Will not experience complications related to bowel motility Outcome: Completed/Met Goal: Will not experience complications related to urinary retention Outcome: Completed/Met   Problem: Pain Management: Goal: General experience of comfort will improve Outcome: Completed/Met   Problem: Safety: Goal: Ability to remain free from injury will improve Outcome: Completed/Met   Problem: Skin Integrity: Goal: Risk for impaired skin integrity will decrease Outcome: Completed/Met   Problem: Education: Goal: Ability to describe self-care measures that may prevent or decrease complications (Diabetes Survival Skills Education) will improve Outcome: Completed/Met Goal: Individualized Educational Video(s) Outcome: Completed/Met   Problem: Coping: Goal: Ability to adjust to condition or change in health will improve Outcome: Completed/Met   Problem:  Fluid Volume: Goal: Ability to maintain a balanced intake and output will improve Outcome: Completed/Met   Problem: Health Behavior/Discharge Planning: Goal: Ability to identify and utilize available resources and services will improve Outcome: Completed/Met Goal: Ability to manage health-related needs will improve Outcome: Completed/Met   Problem: Metabolic: Goal: Ability to maintain appropriate glucose levels will improve Outcome: Completed/Met   Problem: Nutritional: Goal: Maintenance of adequate nutrition will improve Outcome: Completed/Met Goal: Progress toward achieving an optimal weight will improve Outcome: Completed/Met   Problem: Skin Integrity: Goal: Risk for impaired skin integrity will decrease Outcome: Completed/Met   Problem: Tissue Perfusion: Goal: Adequacy of tissue perfusion will improve Outcome: Completed/Met   Problem: Education: Goal: Knowledge of the prescribed therapeutic regimen will improve Outcome: Completed/Met Goal: Understanding of activity limitations/precautions following surgery will improve Outcome: Completed/Met Goal: Individualized Educational Video(s) Outcome: Completed/Met   Problem: Activity: Goal: Ability to tolerate increased activity will improve Outcome: Completed/Met   Problem: Pain Management: Goal: Pain level will decrease with appropriate interventions Outcome: Completed/Met

## 2023-12-15 NOTE — Evaluation (Signed)
 Occupational Therapy Evaluation Patient Details Name: Cassandra Johnston MRN: [id number] DOB: May 04, 1951 Today's Date: 12/15/2023   History of Present Illness Pt is a 73 y/o F s/p R RSA on 1/14. PMH includes depression, DM, diverticulosis, essential HTN, OA, L THA   Clinical Impression   Pt reports ind at baseline with ADL/functional mobility, lives with spouse and granddaughter who can assist at d/c. Pt currently needing set up - mod A for ADLs, CGA for bed mobility and CGA for transfers without AD. Pt educated on NWB precautions, sling wear, compensatory strategies for ADLs and mobility and permitted exercises. Pt verbalized understanding and handouts provided. Pt with nerve block still intact in RUE. Pt presenting with impairments listed below, will follow acutely. Follow physician recommendation for follow up therapies.       If plan is discharge home, recommend the following: A little help with walking and/or transfers;A little help with bathing/dressing/bathroom    Functional Status Assessment  Patient has had a recent decline in their functional status and demonstrates the ability to make significant improvements in function in a reasonable and predictable amount of time.  Equipment Recommendations  None recommended by OT (pt has all needed DME)    Recommendations for Other Services       Precautions / Restrictions Precautions Precautions: Shoulder Type of Shoulder Precautions: ok for ER 0-30, pendulums, and elbow/wrist/hand exercises, NWB RUE, sling at all times Shoulder Interventions: Shoulder sling/immobilizer;Off for dressing/bathing/exercises;At all times Precaution Booklet Issued: Yes (comment) Precaution Comments: pt provided with pendulum, elbow/wrist/hand, sling, and shoulder handouts Required Braces or Orthoses: Sling Restrictions Weight Bearing Restrictions Per Provider Order: Yes RUE Weight Bearing Per Provider Order: Non weight bearing      Mobility Bed  Mobility Overal bed mobility: Needs Assistance Bed Mobility: Sidelying to Sit   Sidelying to sit: Contact guard assist       General bed mobility comments: getting OOB on L side    Transfers Overall transfer level: Needs assistance Equipment used: None Transfers: Sit to/from Stand Sit to Stand: Contact guard assist                  Balance Overall balance assessment: Mild deficits observed, not formally tested                                         ADL either performed or assessed with clinical judgement   ADL Overall ADL's : Needs assistance/impaired Eating/Feeding: Set up   Grooming: Minimal assistance   Upper Body Bathing: Moderate assistance   Lower Body Bathing: Moderate assistance   Upper Body Dressing : Moderate assistance   Lower Body Dressing: Moderate assistance   Toilet Transfer: Contact guard Designer, fashion/clothing- Clothing Manipulation and Hygiene: Contact guard assist       Functional mobility during ADLs: Contact guard assist       Vision   Vision Assessment?: No apparent visual deficits     Perception Perception: Not tested       Praxis Praxis: Not tested       Pertinent Vitals/Pain Pain Assessment Pain Assessment: Faces Pain Score: 4  Faces Pain Scale: Hurts even more Pain Location: R shoulder Pain Descriptors / Indicators: Discomfort Pain Intervention(s): Limited activity within patient's tolerance, Monitored during session, Repositioned, Patient requesting pain meds-RN notified     Extremity/Trunk Assessment Upper Extremity Assessment Upper Extremity Assessment: Right hand dominant;RUE  deficits/detail RUE Deficits / Details: s/p R RSA, nerve block still intact. minimally able to move digits   Lower Extremity Assessment Lower Extremity Assessment: Overall WFL for tasks assessed   Cervical / Trunk Assessment Cervical / Trunk Assessment: Normal   Communication  Communication Communication: No apparent difficulties   Cognition Arousal: Alert Behavior During Therapy: WFL for tasks assessed/performed Overall Cognitive Status: Within Functional Limits for tasks assessed                                       General Comments  pt diaphoretic and end of session, requesting pain pill, RN in room at end of session    Exercises Exercises: Shoulder Shoulder Exercises Pendulum Exercise: Left, 5 reps (done on L side as demo as pt with no sensation in RUE) Elbow Flexion: Left, Right, AROM, PROM Elbow Extension: PROM, AROM, Left, Right, 5 reps Wrist Flexion: PROM, AROM, Left, Right, 5 reps Wrist Extension: PROM, AROM, Left, 5 reps Digit Composite Flexion: PROM, AROM, Left, 5 reps Composite Extension: PROM, AROM, Left, 5 reps   Shoulder Instructions Shoulder Instructions Donning/doffing shirt without moving shoulder: Moderate assistance;Patient able to independently direct caregiver Method for sponge bathing under operated UE: Patient able to independently direct caregiver Donning/doffing sling/immobilizer: Moderate assistance;Patient able to independently direct caregiver Correct positioning of sling/immobilizer: Minimal assistance Pendulum exercises (written home exercise program): Minimal assistance;Patient able to independently direct caregiver ROM for elbow, wrist and digits of operated UE: Minimal assistance;Patient able to independently direct caregiver Sling wearing schedule (on at all times/off for ADL's): Supervision/safety;Patient able to independently direct caregiver Proper positioning of operated UE when showering: Patient able to independently direct caregiver;Minimal assistance Positioning of UE while sleeping: Minimal assistance;Patient able to independently direct caregiver    Home Living Family/patient expects to be discharged to:: Private residence Living Arrangements: Spouse/significant other;Other relatives  (granddaughter) Available Help at Discharge: Family;Available 24 hours/day Type of Home: House Home Access: Stairs to enter [employer] of Steps: 5 Entrance Stairs-Rails: Can reach both Home Layout: Two level;Able to live on main level with bedroom/bathroom     Bathroom Shower/Tub: Tub/shower unit   Bathroom Toilet: Standard     Home Equipment: Cane - single Librarian, academic (2 wheels);Shower seat;Grab bars - tub/shower;Wheelchair - manual          Prior Functioning/Environment Prior Level of Function : Independent/Modified Independent                        OT Problem List: Decreased strength;Decreased activity tolerance;Decreased range of motion;Impaired balance (sitting and/or standing);Impaired UE functional use;Impaired sensation      OT Treatment/Interventions: Self-care/ADL training;Therapeutic exercise;Energy conservation;DME and/or AE instruction;Therapeutic activities;Balance training;Patient/family education    OT Goals(Current goals can be found in the care plan section) Acute Rehab OT Goals Patient Stated Goal: none stated OT Goal Formulation: With patient Time For Goal Achievement: 12/29/23 Potential to Achieve Goals: Good  OT Frequency: Min 1X/week    Co-evaluation              AM-PAC OT "6 Clicks" Daily Activity     Outcome Measure Help from another person eating meals?: A Little Help from another person taking care of personal grooming?: A Little Help from another person toileting, which includes using toliet, bedpan, or urinal?: A Little Help from another person bathing (including washing, rinsing, drying)?: A Lot Help from another person to  put on and taking off regular upper body clothing?: A Lot Help from another person to put on and taking off regular lower body clothing?: A Lot 6 Click Score: 15   End of Session Nurse Communication: Mobility status  Activity Tolerance: Patient tolerated treatment well Patient left:  in bed;with call bell/phone within reach;with nursing/sitter in room;with family/visitor present  OT Visit Diagnosis: Unsteadiness on feet (R26.81);Other abnormalities of gait and mobility (R26.89);Muscle weakness (generalized) (M62.81)                Time: 0960-4540 OT Time Calculation (min): 41 min Charges:  OT General Charges $OT Visit: 1 Visit OT Evaluation $OT Eval Moderate Complexity: 1 Mod OT Treatments $Self Care/Home Management : 8-22 mins $Therapeutic Activity: 8-22 mins  Leatta Alewine K, OTD, OTR/L SecureChat Preferred Acute Rehab [phone]   Keonna Raether K Koonce 12/15/2023, 10:01 AM

## 2023-12-15 NOTE — Progress Notes (Signed)
 PT Cancellation Note  Patient Details Name: Cassandra Johnston MRN: [id number] DOB: Feb 21, 1951   Cancelled Treatment:    Reason Eval/Treat Not Completed: PT screened, no needs identified, will sign off. Per OT pt functioning at necessary level to return home safely with family and has no acute PT needs at this time. Please reconsult if needed in future.  Renaee Caro, PT, DPT Acute Rehabilitation Services Secure chat preferred Office #: [phone]    Jenna Moan 12/15/2023, 9:37 AM

## 2023-12-21 ENCOUNTER — Encounter: Payer: Self-pay | Admitting: Orthopedic Surgery

## 2023-12-22 ENCOUNTER — Other Ambulatory Visit: Payer: Self-pay | Admitting: Surgical

## 2023-12-22 MED ORDER — OXYCODONE HCL 5 MG PO TABS: 5.0000 mg | ORAL_TABLET | Freq: Four times a day (QID) | ORAL | 0 refills | Status: DC | PRN

## 2023-12-29 ENCOUNTER — Ambulatory Visit (INDEPENDENT_AMBULATORY_CARE_PROVIDER_SITE_OTHER): Payer: PPO | Admitting: Surgical

## 2023-12-29 ENCOUNTER — Encounter: Payer: Self-pay | Admitting: Surgical

## 2023-12-29 ENCOUNTER — Other Ambulatory Visit (INDEPENDENT_AMBULATORY_CARE_PROVIDER_SITE_OTHER): Payer: Self-pay

## 2023-12-29 DIAGNOSIS — Z96611 Presence of right artificial shoulder joint: Secondary | ICD-10-CM | POA: Diagnosis not present

## 2023-12-29 NOTE — Progress Notes (Signed)
Post-Op Visit Note   Patient: Cassandra Johnston           Date of Birth: 02-18-1951           MRN: [id number] Visit Date: 12/29/2023 PCP: Willow Ora, MD   Assessment & Plan:  Chief Complaint: No chief complaint on file.  Visit Diagnoses: No diagnosis found.  Plan: Mairi Stagliano is a 73 y.o. female who presents s/p right reverse shoulder arthroplasty on 12/14/23.  Patient is doing well and pain is overall controlled.  Denies any chest pain, SOB, fevers, chills.  No complaint of any instability symptoms.  She is only taking Tylenol for pain control.  Has not had any opioid pain medication in about 3 days.  Did not have CPM machine.    On exam, patient has range of motion 10 degrees X rotation, 65 degrees abduction, 100 degrees forward elevation passively.  Intact EPL, FPL, finger abduction, finger adduction, pronation/supination, bicep, tricep, deltoid of operative extremity.  Axillary nerve intact with deltoid firing.  Incision is healing well without evidence of infection or dehiscence.  Incision was made sure to be covered with Steri-Strips from the proximal to distal aspect of the length of the incision.  2+ radial pulse of the operative extremity  Plan is discontinue sling.  Okay to very lightly lift with the operative extremity but no lifting anything heavier than a coffee cup or cell phone.  Start physical therapy at [facility] physical therapy to focus on passive range of motion and active range of motion with deltoid isometrics.  Do not want to externally rotate past 30 degrees to protect subscapularis repair.  Follow-up in 4 weeks for clinical recheck with Dr. August Saucer.  Follow-Up Instructions: No follow-ups on file.   Orders:  No orders of the defined types were placed in this encounter.  No orders of the defined types were placed in this encounter.   Imaging: No results found.  PMFS History: Patient Active Problem List   Diagnosis Date Noted   S/P reverse  total shoulder arthroplasty, right 12/14/2023   OA (osteoarthritis) of shoulder 12/14/2023   Primary osteoarthritis, right shoulder 11/29/2023   Systolic murmur 07/23/2022   Deformity of nail bed 06/25/2022   Chronic paronychia of finger 06/25/2022   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   Osteoporosis 09/07/2019   Bilateral primary osteoarthritis of knee 09/07/2019   GAD (generalized anxiety disorder) 09/07/2019   Essential hypertension 09/07/2019   H/O lymph node biopsy - abnl follicles /B cells 09/07/2019   Essential tremor 02/03/2017   Peripheral neuropathy 02/03/2017   Status post left hip replacement 07/17/2016   SOB (shortness of breath) 09/24/2015   History of breast cancer 03/23/2012   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/2011   Type 2 diabetes mellitus with complication, with long-term current use of insulin (HCC) 04/24/2009   History of esophageal stricture 04/24/2009   GERD 04/24/2009   Irritable bowel syndrome with diarrhea 04/24/2009   Colon polyps 04/24/2009   Fibromyalgia 11/30/2006   Major depression, recurrent, chronic (HCC) 11/30/1978   Past Medical History:  Diagnosis Date   Anxiety    Arthritis    Breast cancer (HCC) 2010   left   Breast infection    left breast   Chest pain 09/24/2015   Complication of anesthesia    patient states she becomes combative with anesthesia with some surgeries   Depression    Diabetes mellitus    Diverticulosis    Esophageal  stricture    Essential hypertension 09/07/2019   Family history of adverse reaction to anesthesia    father was also combative per patient report    Fibromyalgia    GERD (gastroesophageal reflux disease)    H/O lymph node biopsy - abnl follicles /B cells 09/07/2019   Dr. Darnelle Catalan: at risk for developing CLL.   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, estrogen  receptor positive (HCC) 10/13/2019   Osteoarthritis of left hip 07/17/2016   Osteoporosis 09/07/2019   Palpitations 09/24/2015   Personal history of radiation therapy    Pneumonia    hx of    PONV (postoperative nausea and vomiting)    Shortness of breath 09/24/2015   weight related per patient    Tremor     Family History  Problem Relation Age of Onset   Diabetes Mother    Colon cancer Mother 37   Atrial fibrillation Father    CAD Father    Pancreatic cancer Father    Colon cancer Maternal Great-grandmother    Colon polyps Neg Hx    Esophageal cancer Neg Hx    Rectal cancer Neg Hx    Stomach cancer Neg Hx     Past Surgical History:  Procedure Laterality Date   ABDOMINAL HYSTERECTOMY     APPENDECTOMY     BICEPT TENODESIS Right 12/14/2023   Procedure: BICEPS TENODESIS;  Surgeon: Cammy Copa, MD;  Location: [facility] OR;  Service: Orthopedics;  Laterality: Right;   BREAST LUMPECTOMY  2010   CHOLECYSTECTOMY     COLONOSCOPY  04/30/2009   Marina Goodell   CYSTECTOMY     on thyroid   FRACTURE SURGERY     feet   HAND SURGERY     REVERSE SHOULDER ARTHROPLASTY Right 12/14/2023   Procedure: RIGHT REVERSE SHOULDER ARTHROPLASTY;  Surgeon: Cammy Copa, MD;  Location: [facility] OR;  Service: Orthopedics;  Laterality: Right;   TONSILLECTOMY     TOTAL HIP ARTHROPLASTY Left 07/17/2016   Procedure: LEFT TOTAL HIP ARTHROPLASTY ANTERIOR APPROACH;  Surgeon: Kathryne Hitch, MD;  Location: [facility] ORS;  Service: Orthopedics;  Laterality: Left;   TUBAL LIGATION     Social History   Occupational History   Occupation: Retired  Tobacco Use   Smoking status: Former    Current packs/day: 0.00    Types: Cigarettes    Quit date: 12/01/2003    Years since quitting: 20.0   Smokeless tobacco: Never   Tobacco comments:    Quit 10+ years ago  Vaping Use   Vaping status: Never Used  Substance and Sexual Activity   Alcohol use: No    Comment: Quit 25+ years ago.   Drug use: No   Sexual activity:  Not on file

## 2024-01-05 NOTE — Discharge Summary (Signed)
Physician Discharge Summary      Patient ID: Cassandra Johnston MRN: [id number] DOB/AGE: 1951/09/29 74 y.o.  Admit date: 12/14/2023 Discharge date: 12/15/2023  Admission Diagnoses:  Principal Problem:   S/P reverse total shoulder arthroplasty, right Active Problems:   OA (osteoarthritis) of shoulder   Discharge Diagnoses:  Same  Surgeries: Procedure(s): RIGHT REVERSE SHOULDER ARTHROPLASTY BICEPS TENODESIS on 12/14/2023   Consultants:   Discharged Condition: Stable  Hospital Course: Carmina Walle is an 73 y.o. female who was admitted 12/14/2023 with a chief complaint of right shoulder pain, and found to have a diagnosis of right shoulder arthritis.  They were brought to the operating room on 12/14/2023 and underwent the above named procedures.  Pt awoke from anesthesia without complication and was transferred to the floor. On POD1, patient's pain was controlled.  She had no red flag signs or symptoms.  She was discharged home on POD 1..  Pt will f/u with Dr. August Saucer in clinic in ~2 weeks.   Antibiotics given:  Anti-infectives (From admission, onward)    Start     Dose/Rate Route Frequency Ordered Stop   12/15/23 0600  ceFAZolin (ANCEF) IVPB 2g/100 mL premix        2 g 200 mL/hr over 30 Minutes Intravenous On call to O.R. 12/14/23 1305 12/14/23 1541   12/14/23 2200  ceFAZolin (ANCEF) IVPB 2g/100 mL premix  Status:  Discontinued        2 g 200 mL/hr over 30 Minutes Intravenous Every 8 hours 12/14/23 1951 12/15/23 1752   12/14/23 1605  vancomycin (VANCOCIN) powder  Status:  Discontinued          As needed 12/14/23 1606 12/14/23 1658     .  Recent vital signs:  Vitals:   12/15/23 0505 12/15/23 0745  BP: 139/76 (!) 112/54  Pulse: 66 66  Resp: 16 18  Temp:  98.4 F (36.9 C)  SpO2: 95%     Recent laboratory studies:  Results for orders placed or performed during the hospital encounter of 12/14/23  Urinalysis, w/ Reflex to Culture (Infection Suspected) -Urine,  Clean Catch   Collection Time: 12/14/23  1:20 PM  Result Value Ref Range   Specimen Source URINE, CLEAN CATCH    Color, Urine YELLOW YELLOW   APPearance CLEAR CLEAR   Specific Gravity, Urine 1.015 1.005 - 1.030   pH 5.5 5.0 - 8.0   Glucose, UA >=500 (A) NEGATIVE mg/dL   Hgb urine dipstick NEGATIVE NEGATIVE   Bilirubin Urine NEGATIVE NEGATIVE   Ketones, ur NEGATIVE NEGATIVE mg/dL   Protein, ur NEGATIVE NEGATIVE mg/dL   Nitrite NEGATIVE NEGATIVE   Leukocytes,Ua TRACE (A) NEGATIVE   Squamous Epithelial / HPF 0-5 0 - 5 /HPF   WBC, UA 0-5 0 - 5 WBC/hpf   RBC / HPF 0-5 0 - 5 RBC/hpf   Bacteria, UA FEW (A) NONE SEEN  Glucose, capillary   Collection Time: 12/14/23  1:24 PM  Result Value Ref Range   Glucose-Capillary 156 (H) 70 - 99 mg/dL  Glucose, capillary   Collection Time: 12/14/23  3:17 PM  Result Value Ref Range   Glucose-Capillary 139 (H) 70 - 99 mg/dL  Glucose, capillary   Collection Time: 12/14/23  6:40 PM  Result Value Ref Range   Glucose-Capillary 140 (H) 70 - 99 mg/dL   Comment 1 Notify RN    Comment 2 Document in Chart   Glucose, capillary   Collection Time: 12/14/23  9:14 PM  Result Value Ref Range  Glucose-Capillary 214 (H) 70 - 99 mg/dL   Comment 1 Notify RN    Comment 2 Document in Chart   Glucose, capillary   Collection Time: 12/15/23  6:51 AM  Result Value Ref Range   Glucose-Capillary 245 (H) 70 - 99 mg/dL   Comment 1 Notify RN    Comment 2 Document in Chart     Discharge Medications:   Allergies as of 12/15/2023       Reactions   Naproxen Other (See Comments)   GI   Sulfa Antibiotics Rash        Medication List     STOP taking these medications    traMADol 50 MG tablet Commonly known as: ULTRAM       TAKE these medications    acetaminophen 500 MG tablet Commonly known as: TYLENOL Take 500-1,000 mg by mouth every 6 (six) hours as needed (pain.).   aspirin EC 81 MG tablet Take 1 tablet (81 mg total) by mouth daily. Swallow whole.    Biotin 5000 MCG Tabs Take 10,000 mcg by mouth in the morning.   busPIRone 7.5 MG tablet Commonly known as: BUSPAR TAKE 1 TABLET(7.5 MG) BY MOUTH TWICE DAILY   cephALEXin 500 MG capsule Commonly known as: KEFLEX Take 1 capsule (500 mg total) by mouth 2 (two) times daily.   D-3-5 125 MCG (5000 UT) capsule Generic drug: Cholecalciferol Take 5,000 Units by mouth in the morning.   dimenhyDRINATE 50 MG tablet Commonly known as: DRAMAMINE Take 50 mg by mouth every 8 (eight) hours as needed (dizziness/vertigo).   docusate sodium 100 MG capsule Commonly known as: COLACE Take 1 capsule (100 mg total) by mouth 2 (two) times daily.   DULoxetine 60 MG capsule Commonly known as: CYMBALTA Take 1 capsule (60 mg total) by mouth at bedtime.   famotidine 20 MG tablet Commonly known as: PEPCID Take 40 mg by mouth in the morning.   Jardiance 25 MG Tabs tablet Generic drug: empagliflozin Take 25 mg by mouth in the morning.   losartan 100 MG tablet Commonly known as: COZAAR Take 1 tablet (100 mg total) by mouth daily.   metFORMIN 500 MG tablet Commonly known as: GLUCOPHAGE Take 1,000 mg by mouth 2 (two) times daily.   OneTouch Ultra test strip Generic drug: glucose blood TEST TID   Ozempic (0.25 or 0.5 MG/DOSE) 2 MG/3ML Sopn Generic drug: Semaglutide(0.25 or 0.5MG /DOS) Inject 1 mg into the skin every Monday.   rosuvastatin 10 MG tablet Commonly known as: CRESTOR TAKE 1 TABLET(10 MG) BY MOUTH AT BEDTIME   tiZANidine 4 MG tablet Commonly known as: ZANAFLEX TAKE 1 TABLET(4 MG) BY MOUTH TWICE DAILY AS NEEDED FOR MUSCLE SPASMS What changed: See the new instructions.   Toujeo SoloStar 300 UNIT/ML Solostar Pen Generic drug: insulin glargine (1 Unit Dial) Inject 45 Units into the skin at bedtime.   venlafaxine XR 150 MG 24 hr capsule Commonly known as: EFFEXOR-XR Take 1 capsule (150 mg total) by mouth daily with breakfast.        Diagnostic Studies: XR Shoulder  Right Result Date: 12/29/2023 AP, scapular Y, axillary views of the shoulder reviewed.  Reverse shoulder arthroplasty prosthesis in good position and alignment without any complicating features.  There is no evidence of periprosthetic fracture, dislocation, dissociation of the glenosphere.   DG Shoulder Right Port Result Date: 12/14/2023 CLINICAL DATA:  Status post shoulder surgery. EXAM: RIGHT SHOULDER - 1 VIEW COMPARISON:  None Available. FINDINGS: Reverse right shoulder arthroplasty in expected alignment.  No periprosthetic lucency or fracture. Recent postsurgical change includes air and edema in the joint space and soft tissues. IMPRESSION: Reverse right shoulder arthroplasty without immediate postoperative complication. Electronically Signed   By: Narda Rutherford M.D.   On: 12/14/2023 19:06    Disposition: Discharge disposition: 01-Home or Self Care       Discharge Instructions     Call MD / Call 911   Complete by: As directed    If you experience chest pain or shortness of breath, CALL 911 and be transported to the hospital emergency room.  If you develope a fever above 101 F, pus (white drainage) or increased drainage or redness at the wound, or calf pain, call your surgeon's office.   Constipation Prevention   Complete by: As directed    Drink plenty of fluids.  Prune juice may be helpful.  You may use a stool softener, such as Colace (over the counter) 100 mg twice a day.  Use MiraLax (over the counter) for constipation as needed.   Diet - low sodium heart healthy   Complete by: As directed    Discharge instructions   Complete by: As directed    You may shower, dressing is waterproof.  Do not bathe or soak the operative shoulder in a tub, pool.  Use the CPM machine 3 times a day for one hour each time.  No lifting with the operative shoulder. Continue use of the sling.  Follow-up with Franky Macho in ~2 weeks on your given appointment date on 12/29/23 for 1:00 PM.  We will remove your  adhesive bandage at that time.    Dental Antibiotics:  In most cases prophylactic antibiotics for Dental procdeures after total joint surgery are not necessary.  Exceptions are as follows:  1. History of prior total joint infection  2. Severely immunocompromised (Organ Transplant, cancer chemotherapy, Rheumatoid biologic meds such as Humera)  3. Poorly controlled diabetes (A1C &gt; 8.0, blood glucose over 200)  If you have one of these conditions, contact your surgeon for an antibiotic prescription, prior to your dental procedure.   Increase activity slowly as tolerated   Complete by: As directed    Post-operative opioid taper instructions:   Complete by: As directed    POST-OPERATIVE OPIOID TAPER INSTRUCTIONS: It is important to wean off of your opioid medication as soon as possible. If you do not need pain medication after your surgery it is ok to stop day one. Opioids include: Codeine, Hydrocodone(Norco, Vicodin), Oxycodone(Percocet, oxycontin) and hydromorphone amongst others.  Long term and even short term use of opiods can cause: Increased pain response Dependence Constipation Depression Respiratory depression And more.  Withdrawal symptoms can include Flu like symptoms Nausea, vomiting And more Techniques to manage these symptoms Hydrate well Eat regular healthy meals Stay active Use relaxation techniques(deep breathing, meditating, yoga) Do Not substitute Alcohol to help with tapering If you have been on opioids for less than two weeks and do not have pain than it is ok to stop all together.  Plan to wean off of opioids This plan should start within one week post op of your joint replacement. Maintain the same interval or time between taking each dose and first decrease the dose.  Cut the total daily intake of opioids by one tablet each day Next start to increase the time between doses. The last dose that should be eliminated is the evening dose.              SignedKarenann Cai 01/05/2024,  11:01 AM

## 2024-01-28 ENCOUNTER — Telehealth: Payer: Self-pay | Admitting: Orthopedic Surgery

## 2024-01-28 NOTE — Telephone Encounter (Signed)
 Patient called advised she fell 01/25/2024 and hit the shoulder she had surgery on. Patient said the shoulder is hurting and she can only life it in the morning but, by the afternoon she can't lift it. Patient said her B/P bottomed out causing her to pass out.   The number to contact patient is [phone]

## 2024-01-28 NOTE — Telephone Encounter (Signed)
 Scheduled work in appt.

## 2024-01-31 ENCOUNTER — Encounter: Payer: Self-pay | Admitting: Orthopedic Surgery

## 2024-01-31 ENCOUNTER — Ambulatory Visit: Payer: PPO | Admitting: Orthopedic Surgery

## 2024-01-31 ENCOUNTER — Other Ambulatory Visit: Payer: Self-pay

## 2024-01-31 DIAGNOSIS — M533 Sacrococcygeal disorders, not elsewhere classified: Secondary | ICD-10-CM

## 2024-01-31 DIAGNOSIS — Z96611 Presence of right artificial shoulder joint: Secondary | ICD-10-CM | POA: Diagnosis not present

## 2024-01-31 MED ORDER — OXYCODONE HCL 5 MG PO TABS: ORAL_TABLET | ORAL | 0 refills | Status: DC

## 2024-01-31 NOTE — Progress Notes (Unsigned)
 Office Visit Note   Patient: Cassandra Johnston           Date of Birth: 04-17-1951           MRN: [id number] Visit Date: 01/31/2024 Requested by: Willow Ora, MD [address] [place],  [place] [address] PCP: Willow Ora, MD  Subjective: Chief Complaint  Patient presents with   Right Shoulder - Pain    Increased pain s/p fall on 01/25/24   Other     Sacral pain s/p fall on 01/25/24-syncopal episode    HPI: Cassandra Johnston is a 73 y.o. female who presents to the office reporting sacral pain and right shoulder pain.  She had reverse shoulder replacement 12/14/2023.  Was doing very well with that until her fall.  She also is reporting some sacral pain.  Has a history of prior fracture in the coccyx.  She passed out.  She did not do therapy on her shoulder in physical therapy but worked on it on her own.  The reason she fell was a syncopal episode.  Blood pressure medication per patient may have played a role..                ROS: All systems reviewed are negative as they relate to the chief complaint within the history of present illness.  Patient denies fevers or chills.  Assessment & Plan: Visit Diagnoses:  1. S/P reverse total shoulder arthroplasty, right   2. Sacral pain     Plan: Impression is no evident damage to the right reverse shoulder replacement.  Passive range of motion is still pretty good at 30/90/125.  She can continue working on range of motion and strengthening exercises for the shoulder.  Does have tenderness directly on the coccyx region with no groin pain with internal/external rotation of either leg.  Radiographs do not show clear-cut fracture but I think she likely has a deep bone bruise in this area.  6-week return with [facility] for final check on shoulder.  Cane and/or walker recommended.  Oxycodone refilled x 1.  Follow-Up Instructions: Return in about 6 weeks (around 03/13/2024).   Orders:  Orders Placed This Encounter  Procedures   XR  Shoulder Right   XR Sacrum/Coccyx   Meds ordered this encounter  Medications   oxyCODONE (OXY IR/ROXICODONE) 5 MG immediate release tablet    Sig: 1 po q hs prn pain    Dispense:  10 tablet    Refill:  0      Procedures: No procedures performed   Clinical Data: No additional findings.  Objective: Vital Signs: There were no vitals taken for this visit.  Physical Exam:  Constitutional: Patient appears well-developed HEENT:  Head: Normocephalic Eyes:EOM are normal Neck: Normal range of motion Cardiovascular: Normal rate Pulmonary/chest: Effort normal Neurologic: Patient is alert Skin: Skin is warm Psychiatric: Patient has normal mood and affect  Ortho Exam: Ortho exam demonstrates pretty good functional deltoid with reduced shoulder on the right-hand side.  Motor or sensory function in the hand is intact.  No bruising is present in that right shoulder region no crepitus is present with range of motion.  Does have tenderness to palpation in that coccygeal region.  No groin pain with internal/external Tatian of the leg and the patient is able to weight-bear.  Specialty Comments:  No specialty comments available.  Imaging: No results found.   PMFS History: Patient Active Problem List   Diagnosis Date Noted   S/P reverse total  shoulder arthroplasty, right 12/14/2023   OA (osteoarthritis) of shoulder 12/14/2023   Primary osteoarthritis, right shoulder 11/29/2023   Systolic murmur 07/23/2022   Deformity of nail bed 06/25/2022   Chronic paronychia of finger 06/25/2022   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   Osteoporosis 09/07/2019   Bilateral primary osteoarthritis of knee 09/07/2019   GAD (generalized anxiety disorder) 09/07/2019   Essential hypertension 09/07/2019   H/O lymph node biopsy - abnl follicles /B cells 09/07/2019   Essential tremor 02/03/2017   Peripheral neuropathy 02/03/2017   Status post left hip replacement 07/17/2016   SOB (shortness of breath)  09/24/2015   History of breast cancer 03/23/2012   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/2011   Type 2 diabetes mellitus with complication, with long-term current use of insulin (HCC) 04/24/2009   History of esophageal stricture 04/24/2009   GERD 04/24/2009   Irritable bowel syndrome with diarrhea 04/24/2009   Colon polyps 04/24/2009   Fibromyalgia 11/30/2006   Major depression, recurrent, chronic (HCC) 11/30/1978   Past Medical History:  Diagnosis Date   Anxiety    Arthritis    Breast cancer (HCC) 2010   left   Breast infection    left breast   Chest pain 09/24/2015   Complication of anesthesia    patient states she becomes combative with anesthesia with some surgeries   Depression    Diabetes mellitus    Diverticulosis    Esophageal stricture    Essential hypertension 09/07/2019   Family history of adverse reaction to anesthesia    father was also combative per patient report    Fibromyalgia    GERD (gastroesophageal reflux disease)    H/O lymph node biopsy - abnl follicles /B cells 09/07/2019   Dr. Darnelle Catalan: at risk for developing CLL.   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, estrogen receptor positive (HCC) 10/13/2019   Osteoarthritis of left hip 07/17/2016   Osteoporosis 09/07/2019   Palpitations 09/24/2015   Personal history of radiation therapy    Pneumonia    hx of    PONV (postoperative nausea and vomiting)    Shortness of breath 09/24/2015   weight related per patient    Tremor     Family History  Problem Relation Age of Onset   Diabetes Mother    Colon cancer Mother 44   Atrial fibrillation Father    CAD Father    Pancreatic cancer Father    Colon cancer Maternal Great-grandmother    Colon polyps Neg Hx    Esophageal cancer Neg Hx    Rectal cancer Neg Hx    Stomach cancer Neg Hx     Past Surgical History:   Procedure Laterality Date   ABDOMINAL HYSTERECTOMY     APPENDECTOMY     BICEPT TENODESIS Right 12/14/2023   Procedure: BICEPS TENODESIS;  Surgeon: Cammy Copa, MD;  Location: [facility] OR;  Service: Orthopedics;  Laterality: Right;   BREAST LUMPECTOMY  2010   CHOLECYSTECTOMY     COLONOSCOPY  04/30/2009   Marina Goodell   CYSTECTOMY     on thyroid   FRACTURE SURGERY     feet   HAND SURGERY     REVERSE SHOULDER ARTHROPLASTY Right 12/14/2023   Procedure: RIGHT REVERSE SHOULDER ARTHROPLASTY;  Surgeon: Cammy Copa, MD;  Location: [facility] OR;  Service: Orthopedics;  Laterality: Right;   TONSILLECTOMY  TOTAL HIP ARTHROPLASTY Left 07/17/2016   Procedure: LEFT TOTAL HIP ARTHROPLASTY ANTERIOR APPROACH;  Surgeon: Kathryne Hitch, MD;  Location: [facility] ORS;  Service: Orthopedics;  Laterality: Left;   TUBAL LIGATION     Social History   Occupational History   Occupation: Retired  Tobacco Use   Smoking status: Former    Current packs/day: 0.00    Types: Cigarettes    Quit date: 12/01/2003    Years since quitting: 20.1   Smokeless tobacco: Never   Tobacco comments:    Quit 10+ years ago  Vaping Use   Vaping status: Never Used  Substance and Sexual Activity   Alcohol use: No    Comment: Quit 25+ years ago.   Drug use: No   Sexual activity: Not on file

## 2024-02-11 ENCOUNTER — Encounter: Payer: Self-pay | Admitting: Family Medicine

## 2024-02-14 ENCOUNTER — Ambulatory Visit: Payer: Self-pay | Admitting: Family Medicine

## 2024-02-14 NOTE — Telephone Encounter (Signed)
 Copied from CRM [id number]. Topic: Clinical - Red Word Triage >> Feb 14, 2024  2:26 PM Gurney Maxin H wrote: [facility] that prompted transfer to Nurse Triage: Pain on butt, patient fell and fractured tailbone a couple weeks ago due to loosing conscious   Chief Complaint: Pain  Symptoms: Pain (Coccyx)  Frequency: Ongoing  Pertinent Negatives: Patient denies  Disposition: [] ED /[] Urgent Care (no appt availability in office) / [x] Appointment(In office/virtual)/ []  [facility]/ [] Home Care/ [] Refused Recommended Disposition /[] [facility]/ []  Follow-up with PCP  Additional Notes: [facility] is being triaged for pain related for a fractured Coccyx. The patient states she has been on three analgesics for pain for about a while now and she is out of her medications. Recommended the patient see her provider in office tomorrow, patient preferred to have her appointment scheduled for Tuesday. Strongly encouraged the patient to seek Urgent Care for treatment.   Reason for Disposition  [1] MODERATE pain (e.g., interferes with normal activities) AND [2] present > 3 days    Patient states her pain is moderate to severe but preferred an appointment on Tuesday.  Answer Assessment - Initial Assessment Questions 1. ONSET: "When did the muscle aches or body pains start?"      Ongoing  2. LOCATION: "What part of your body is hurting?" (e.g., entire body, arms, legs)      Coccyx  3. SEVERITY: "How bad is the pain?" (Scale 1-10; or mild, moderate, severe)   - MILD (1-3): doesn't interfere with normal activities    - MODERATE (4-7): interferes with normal activities or awakens from sleep    - SEVERE (8-10):  excruciating pain, unable to do any normal activities      10  4. CAUSE: "What do you think is causing the pains?"     Coccyx and Shoulder  5. FEVER: "Have you been having fever?"     Related to history  6. OTHER SYMPTOMS: "Do you have any other symptoms?" (e.g., chest pain, weakness,  rash, cold or flu symptoms, weight loss)     Neuropathy  7. PREGNANCY: "Is there any chance you are pregnant?" "When was your last menstrual period?"     No and NO  8. TRAVEL: "Have you traveled out of the country in the last month?" (e.g., travel history, exposures)     no  Protocols used: Muscle Aches and Body Pain-A-AH

## 2024-02-15 NOTE — Telephone Encounter (Signed)
  Last OV: 11/28/24  Next OV: 02/22/24  Last Filled: 01/31/24  Quantity: 10

## 2024-02-16 MED ORDER — HYDROCODONE-ACETAMINOPHEN 5-325 MG PO TABS: 1.0000 | ORAL_TABLET | Freq: Four times a day (QID) | ORAL | 0 refills | Status: DC | PRN

## 2024-02-16 NOTE — Telephone Encounter (Signed)
Please see patient message and advise.

## 2024-02-16 NOTE — Telephone Encounter (Signed)
 Noted.

## 2024-02-22 ENCOUNTER — Encounter: Payer: Self-pay | Admitting: Family Medicine

## 2024-02-22 ENCOUNTER — Ambulatory Visit (INDEPENDENT_AMBULATORY_CARE_PROVIDER_SITE_OTHER): Admitting: Family Medicine

## 2024-02-22 VITALS — BP 138/78 | HR 89 | Temp 97.7°F | Ht 64.0 in | Wt 160.0 lb

## 2024-02-22 DIAGNOSIS — E118 Type 2 diabetes mellitus with unspecified complications: Secondary | ICD-10-CM | POA: Diagnosis not present

## 2024-02-22 DIAGNOSIS — Z794 Long term (current) use of insulin: Secondary | ICD-10-CM | POA: Diagnosis not present

## 2024-02-22 DIAGNOSIS — E119 Type 2 diabetes mellitus without complications: Secondary | ICD-10-CM

## 2024-02-22 DIAGNOSIS — I4719 Other supraventricular tachycardia: Secondary | ICD-10-CM

## 2024-02-22 DIAGNOSIS — R55 Syncope and collapse: Secondary | ICD-10-CM

## 2024-02-22 DIAGNOSIS — M533 Sacrococcygeal disorders, not elsewhere classified: Secondary | ICD-10-CM

## 2024-02-22 DIAGNOSIS — Z96611 Presence of right artificial shoulder joint: Secondary | ICD-10-CM | POA: Diagnosis not present

## 2024-02-22 DIAGNOSIS — I10 Essential (primary) hypertension: Secondary | ICD-10-CM | POA: Diagnosis not present

## 2024-02-22 LAB — COMPREHENSIVE METABOLIC PANEL
ALT: 19 U/L (ref 0–35)
AST: 25 U/L (ref 0–37)
Albumin: 3.9 g/dL (ref 3.5–5.2)
Alkaline Phosphatase: 144 U/L — ABNORMAL HIGH (ref 39–117)
BUN: 13 mg/dL (ref 6–23)
CO2: 30 meq/L (ref 19–32)
Calcium: 9.4 mg/dL (ref 8.4–10.5)
Chloride: 102 meq/L (ref 96–112)
Creatinine, Ser: 0.77 mg/dL (ref 0.40–1.20)
GFR: 76.85 mL/min (ref >60.00)
Glucose, Bld: 103 mg/dL — ABNORMAL HIGH (ref 70–99)
Potassium: 4 meq/L (ref 3.5–5.1)
Sodium: 139 meq/L (ref 135–145)
Total Bilirubin: 0.7 mg/dL (ref 0.2–1.2)
Total Protein: 6.9 g/dL (ref 6.0–8.3)

## 2024-02-22 LAB — CBC WITH DIFFERENTIAL/PLATELET
Basophils Absolute: 0.3 10*3/uL — ABNORMAL HIGH (ref 0.0–0.1)
Basophils Relative: 3.1 % — ABNORMAL HIGH (ref 0.0–3.0)
Eosinophils Absolute: 0.4 10*3/uL (ref 0.0–0.7)
Eosinophils Relative: 3.8 % (ref 0.0–5.0)
HCT: 37.2 % (ref 36.0–46.0)
Hemoglobin: 12.2 g/dL (ref 12.0–15.0)
Lymphocytes Relative: 39.2 % (ref 12.0–46.0)
Lymphs Abs: 3.8 10*3/uL (ref 0.7–4.0)
MCHC: 32.8 g/dL (ref 30.0–36.0)
MCV: 89.1 fl (ref 78.0–100.0)
Monocytes Absolute: 1.1 10*3/uL — ABNORMAL HIGH (ref 0.1–1.0)
Monocytes Relative: 10.9 % (ref 3.0–12.0)
Neutro Abs: 4.2 10*3/uL (ref 1.4–7.7)
Neutrophils Relative %: 43 % (ref 43.0–77.0)
Platelets: 226 10*3/uL (ref 150.0–400.0)
RBC: 4.17 Mil/uL (ref 3.87–5.11)
RDW: 15.7 % — ABNORMAL HIGH (ref 11.5–15.5)
WBC: 9.7 10*3/uL (ref 4.0–10.5)

## 2024-02-22 LAB — MICROALBUMIN / CREATININE URINE RATIO
Creatinine,U: 113.5 mg/dL
Microalb Creat Ratio: 62 mg/g — ABNORMAL HIGH (ref 0.0–30.0)
Microalb, Ur: 7 mg/dL — ABNORMAL HIGH (ref 0.0–1.9)

## 2024-02-22 LAB — HEMOGLOBIN A1C: Hgb A1c MFr Bld: 7 % — ABNORMAL HIGH (ref 4.6–6.5)

## 2024-02-22 LAB — TSH: TSH: 3.28 u[IU]/mL (ref 0.35–5.50)

## 2024-02-22 MED ORDER — TIZANIDINE HCL 4 MG PO TABS: 4.0000 mg | ORAL_TABLET | Freq: Every day | ORAL | 3 refills | Status: DC

## 2024-02-22 NOTE — Progress Notes (Signed)
 Subjective  CC:  Chief Complaint  Patient presents with   Tailbone Pain    Pt has been having tailbone pain for the past month and she aslo has some concerns with her Bp and meds for bp    HPI: Cassandra Johnston is a 73 y.o. female who presents to the office today to address the problems listed above in the chief complaint. Discussed the use of AI scribe software for clinical note transcription with the patient, who gave verbal consent to proceed.  History of Present Illness   Cassandra Johnston is a 73 year old female who presents with a recent fall and concerns about blood pressure. She lives with her granddaughter, who is an EMT.  In late February 2025, she experienced a syncopal episode, waking at 3:00 AM due to her dog and feeling unwell as she reached the bathroom sink, leading to a loss of consciousness. She has a history of similar episodes, including one after her hip replacement surgery. She was not on pain medication at the time of the recent fall and had eaten normally that day. No current lightheadedness, chest pain, or palpitations. Since the incident, she feels fine except for pain in her tailbone and arm. (Had shoulder surgery in January)  During the fall, she hit her cheek, arm, and coccyx, regaining consciousness upon hitting her head on the floor. She did not seek immediate medical attention but later had her arm checked with orhto and I reviewed the notes., where an X-ray showed no new fracture, only an old one. Her tailbone still hurts, but her arm has improved with good mobility.  She mentions her blood pressure was elevated this morning, but she does not regularly check it at home. Her granddaughter was supposed to bring a blood pressure cuff but left it at work. She is concerned about her blood pressure dropping, as it did during her hip replacement recovery. No lightheadedness, orthostatic symptoms, palpitations, chest pain or shortness of breath.  No lower  extremity edema. She reports she had a similar syncopal episode after her hip surgery many years ago.  She does have a heart murmur, most recent echocardiogram was 2016.  Reviewed.  Diastolic dysfunction noted and trivial mitral regurg.  She is on Lantus and oral diabetic medications, her diabetes has been well-controlled.  She denies symptoms of hypoglycemia.  No diaphoresis during the syncopal event.  She denies the possibility of low blood sugars.      Assessment  1. Essential hypertension   2. S/P reverse total shoulder arthroplasty, right   3. Coccyx pain   4. Syncope, unspecified syncope type   5. PAT (paroxysmal atrial tachycardia) (HCC)   6. Type 2 diabetes mellitus with complication, with long-term current use of insulin (HCC)   7. Insulin-requiring or dependent type II diabetes mellitus (HCC)      Plan  Assessment and Plan    Syncope Experienced a syncopal episode in February with a fall. EKG shows increased atrial ectopic beats, suggesting possible cardiac cause. - Refer to cardiologist for further evaluation. - Order echocardiogram. - Send to lab for blood work.  Atrial ectopic beats EKG showed increased frequency of atrial ectopic beats, significant in context of recent syncope. - Refer to cardiologist for further evaluation. - Order echocardiogram.  Coccyx contusion Persistent pain post-fall, improving. X-ray showed no new fractures.  Arm contusion Injured arm during fall, no fracture, good mobility, pain improving.  Hypertension Blood pressure 138/78 mmHg, well-managed on current medication. -  Continue current blood pressure medication. - Advise to obtain a blood pressure cuff for home monitoring.  Chronic muscle pain Takes muscle relaxer nightly for chronic pain. - Refill muscle relaxer prescription.     Diabetes: Recheck A1c and urine nephropathy screen.  Follows with endocrinology.  I spent a total of 50 minutes for this patient encounter. Time spent  included preparation, face-to-face counseling with the patient and coordination of care, review of chart and records, and documentation of the encounter.   Orders Placed This Encounter  Procedures   Hemoglobin A1c   CBC with Differential/Platelet   Comprehensive metabolic panel   TSH   Microalbumin / creatinine urine ratio   Ambulatory referral to Cardiology   EKG 12-Lead   EKG 12-Lead   ECHOCARDIOGRAM COMPLETE   Meds ordered this encounter  Medications   tiZANidine (ZANAFLEX) 4 MG tablet    Sig: Take 1 tablet (4 mg total) by mouth at bedtime.    Dispense:  90 tablet    Refill:  3     I reviewed the patients updated PMH, FH, and SocHx.    Patient Active Problem List   Diagnosis Date Noted   Osteoporosis 09/07/2019    Priority: High   GAD (generalized anxiety disorder) 09/07/2019    Priority: High   Essential hypertension 09/07/2019    Priority: High   H/O lymph node biopsy - abnl follicles /B cells 09/07/2019    Priority: High   Peripheral neuropathy 02/03/2017    Priority: High   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/2011    Priority: High   Type 2 diabetes mellitus with complication, with long-term current use of insulin (HCC) 04/24/2009    Priority: High   Fibromyalgia 11/30/2006    Priority: High   Major depression, recurrent, chronic (HCC) 11/30/1978    Priority: High   Systolic murmur 07/23/2022    Priority: Medium    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    Priority: Medium    Bilateral primary osteoarthritis of knee 09/07/2019    Priority: Medium    Essential tremor 02/03/2017    Priority: Medium    History of breast cancer 03/23/2012    Priority: Medium    GERD 04/24/2009    Priority: Medium    Irritable bowel syndrome with diarrhea 04/24/2009    Priority: Medium    Colon polyps 04/24/2009    Priority: Medium    Status post left hip replacement 07/17/2016    Priority: Low   History of esophageal stricture  04/24/2009    Priority: Low   S/P reverse total shoulder arthroplasty, right 12/14/2023   OA (osteoarthritis) of shoulder 12/14/2023   Primary osteoarthritis, 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   Biotin 5000 MCG TABS Take 10,000 mcg by mouth in the morning.   busPIRone (BUSPAR) 7.5 MG tablet TAKE 1 TABLET(7.5 MG) BY MOUTH TWICE DAILY   DULoxetine (CYMBALTA) 60 MG capsule Take 1 capsule (60 mg total) by mouth at bedtime.   empagliflozin (JARDIANCE) 25 MG TABS tablet Take 25 mg by mouth in the morning.   famotidine (PEPCID) 20 MG tablet Take 40 mg by mouth in the morning.   HYDROcodone-acetaminophen (NORCO/VICODIN) 5-325 MG tablet Take 1 tablet by mouth every 6 (  six) hours as needed for severe pain (pain score 7-10).   insulin glargine, 1 Unit Dial, (TOUJEO SOLOSTAR) 300 UNIT/ML Solostar Pen Inject 45 Units into the skin at bedtime.   losartan (COZAAR) 100 MG tablet Take 1 tablet (100 mg total) by mouth daily.   metFORMIN (GLUCOPHAGE) 500 MG tablet Take 1,000 mg by mouth 2 (two) times daily.   ONETOUCH ULTRA test strip TEST TID   rosuvastatin (CRESTOR) 10 MG tablet TAKE 1 TABLET(10 MG) BY MOUTH AT BEDTIME   Semaglutide,0.25 or 0.5MG /DOS, (OZEMPIC, 0.25 OR 0.5 MG/DOSE,) 2 MG/3ML SOPN Inject 1 mg into the skin every Monday.   venlafaxine XR (EFFEXOR-XR) 150 MG 24 hr capsule Take 1 capsule (150 mg total) by mouth daily with breakfast.   [DISCONTINUED] tiZANidine (ZANAFLEX) 4 MG tablet TAKE 1 TABLET(4 MG) BY MOUTH TWICE DAILY AS NEEDED FOR MUSCLE SPASMS (Patient taking differently: Take 4 mg by mouth at bedtime.)    Allergies: Patient is allergic to naproxen and sulfa antibiotics. Family History: Patient family history includes Atrial fibrillation in  her father; CAD in her father; Colon cancer in her maternal great-grandmother; Colon cancer (age of onset: 40) in her mother; Diabetes in her mother; Pancreatic cancer in her father. Social History:  Patient  reports that she quit smoking about 20 years ago. Her smoking use included cigarettes. She has never used smokeless tobacco. She reports that she does not drink alcohol and does not use drugs.  Review of Systems: Constitutional: Negative for fever malaise or anorexia Cardiovascular: negative for chest pain Respiratory: negative for SOB or persistent cough Gastrointestinal: negative for abdominal pain  Objective  Vitals: BP 138/78 (BP Location: Right Arm, Patient Position: Supine, Cuff Size: Normal)   Pulse 89   Temp 97.7 F (36.5 C)   Ht 5\' 4"  (1.626 m)   Wt 160 lb (72.6 kg)   SpO2 96%   BMI 27.46 kg/m  General: no acute distress , A&Ox3 HEENT: PEERL, conjunctiva normal, neck is supple Cardiovascular:  RRR with 2/6 systolic murmur and audible ectopic beats Respiratory:  Good breath sounds bilaterally, CTAB with normal respiratory effort Skin:  Warm, no rashes Tail bone, minimal ttp  EKG: ? PAT, no acute changes. NSR, new since last tracing (PACs noted then) Commons side effects, risks, benefits, and alternatives for medications and treatment plan prescribed today were discussed, and the patient expressed understanding of the given instructions. Patient is instructed to call or message via MyChart if he/she has any questions or concerns regarding our treatment plan. No barriers to understanding were identified. We discussed Red Flag symptoms and signs in detail. Patient expressed understanding regarding what to do in case of urgent or emergency type symptoms.  Medication list was reconciled, printed and provided to the patient in AVS. Patient instructions and summary information was reviewed with the patient as documented in the AVS. This note was prepared with assistance of Dragon  voice recognition software. Occasional wrong-word or sound-a-like substitutions may have occurred due to the inherent limitations of voice recognition software

## 2024-02-22 NOTE — Patient Instructions (Addendum)
 Please return in 6 months for your annual complete physical; please come fasting.    I will release your lab results to you on your MyChart account with further instructions. You may see the results before I do, but when I review them I will send you a message with my report or have my assistant call you if things need to be discussed. Please reply to my message with any questions. Thank you!   If you have any questions or concerns, please don't hesitate to send me a message via MyChart or call the office at [phone]. Thank you for visiting with Cassandra Johnston today! It's our pleasure caring for you.   VISIT SUMMARY:  Today, we discussed your recent fall and concerns about your blood pressure. You experienced a fainting episode in February, which led to a fall and injuries to your cheek, arm, and tailbone. We reviewed your EKG results and discussed your current medications and pain management.  YOUR PLAN:  -SYNCOPE: Syncope is a temporary loss of consciousness usually related to insufficient blood flow to the brain. We will refer you to a cardiologist for further evaluation, order an echocardiogram, and send you to the lab for blood work.  -ATRIAL ECTOPIC BEATS: Atrial ectopic beats are extra heartbeats that originate in the atria. Given your recent fainting episode, we will refer you to a cardiologist for further evaluation and order an echocardiogram.  -COCCYX CONTUSION: A coccyx contusion is a bruise to the tailbone area. Your pain is improving, and the X-ray showed no new fractures.  -ARM CONTUSION: An arm contusion is a bruise to the arm. Your arm has good mobility, and the pain is improving with no fractures found.  -HYPERTENSION: Hypertension is high blood pressure. Your blood pressure is well-managed on your current medication, but we advise you to obtain a blood pressure cuff for home monitoring.  -CHRONIC MUSCLE PAIN: Chronic muscle pain is long-term pain in the muscles. We will refill your  muscle relaxer prescription to help manage this pain.  INSTRUCTIONS:  Please follow up with the cardiologist as soon as possible for further evaluation of your syncope and atrial ectopic beats. Additionally, get an echocardiogram and complete the lab work as ordered. Obtain a blood pressure cuff for home monitoring and continue taking your current medications as prescribed.

## 2024-02-25 ENCOUNTER — Telehealth: Payer: Self-pay | Admitting: Family Medicine

## 2024-02-25 ENCOUNTER — Other Ambulatory Visit: Payer: Self-pay

## 2024-02-25 MED ORDER — DULOXETINE HCL 60 MG PO CPEP: 60.0000 mg | ORAL_CAPSULE | Freq: Every day | ORAL | 1 refills | Status: DC

## 2024-02-25 NOTE — Telephone Encounter (Signed)
..   Encourage patient to contact the pharmacy for refills or they can request refills through [facility]  LAST APPOINTMENT DATE: 02/22/2024  NEXT APPOINTMENT DATE: 06/05/2024  MEDICATION: cymbalta (3 month script if possible)  Is the patient out of medication? no  PHARMACY:  [facility] in [place]   Let patient know to contact pharmacy at the end of the day to make sure medication is ready.  Please notify patient to allow 48-72 hours to process  CLINICAL FILLS OUT ALL BELOW:   LAST REFILL:  QTY:  REFILL DATE:    OTHER COMMENTS:    Okay for refill?  Please advise

## 2024-02-25 NOTE — Telephone Encounter (Signed)
 Rx sent

## 2024-02-28 ENCOUNTER — Encounter: Payer: Self-pay | Admitting: Family Medicine

## 2024-02-28 NOTE — Telephone Encounter (Signed)
 Noted.

## 2024-02-28 NOTE — Progress Notes (Signed)
 See mychart note Dear Ms. Maskell, I hope you are feeling better.  Your lab results show your diabetic control is fair at 7.0. work on your diet to get this number a little lower. Your other results are all stable. Please let me know if you don't hear about an appointment with the cardiologist.   Sincerely, Dr. Mardelle Matte

## 2024-03-09 ENCOUNTER — Ambulatory Visit (HOSPITAL_BASED_OUTPATIENT_CLINIC_OR_DEPARTMENT_OTHER)

## 2024-03-09 DIAGNOSIS — I4719 Other supraventricular tachycardia: Secondary | ICD-10-CM | POA: Diagnosis not present

## 2024-03-09 DIAGNOSIS — R55 Syncope and collapse: Secondary | ICD-10-CM | POA: Diagnosis not present

## 2024-03-09 LAB — ECHOCARDIOGRAM COMPLETE
Area-P 1/2: 3.27 cm2
S' Lateral: 1.96 cm

## 2024-03-20 ENCOUNTER — Encounter: Payer: Self-pay | Admitting: Family Medicine

## 2024-03-20 NOTE — Progress Notes (Signed)
 See mychart note Dear Ms. Rettig, Your echocardiogram is normal. This is good news.  Sincerely, Dr. Jonelle Neri

## 2024-03-22 ENCOUNTER — Ambulatory Visit: Admitting: Orthopedic Surgery

## 2024-04-05 DIAGNOSIS — E1165 Type 2 diabetes mellitus with hyperglycemia: Secondary | ICD-10-CM | POA: Diagnosis not present

## 2024-04-07 ENCOUNTER — Encounter: Payer: Self-pay | Admitting: Orthopedic Surgery

## 2024-04-07 ENCOUNTER — Ambulatory Visit: Admitting: Orthopedic Surgery

## 2024-04-07 DIAGNOSIS — Z96611 Presence of right artificial shoulder joint: Secondary | ICD-10-CM

## 2024-04-07 NOTE — Progress Notes (Unsigned)
 Post-Op Visit Note   Patient: Cassandra Johnston           Date of Birth: 1951-09-26           MRN: [id number] Visit Date: 04/07/2024 PCP: Luevenia Saha, MD   Assessment & Plan:  Chief Complaint:  Chief Complaint  Patient presents with   Right Shoulder - Follow-up    reverse shoulder replacement 12/14/2023   Visit Diagnoses:  1. S/P reverse total shoulder arthroplasty, right     Plan: Patient is now about 4 months out reverse shoulder replacement.  She is doing better.  Takes Tylenol  for very occasional episodic pain.  Doing home exercises.  On examination she has range of motion of 60/100/160 with easy flexion and abduction above 90 degrees.  Incision is intact.  Overall Cassandra Johnston has done an excellent job of rehabilitating her reverse shoulder replacement.  She has a very good result with minimal to no pain and very functional range of motion.  She will follow-up with us  as needed.  Follow-Up Instructions: No follow-ups on file.   Orders:  No orders of the defined types were placed in this encounter.  No orders of the defined types were placed in this encounter.   Imaging: No results found.  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   Osteoporosis 09/07/2019   Bilateral primary osteoarthritis of knee 09/07/2019   GAD (generalized anxiety disorder) 09/07/2019   Essential hypertension 09/07/2019   H/O lymph node biopsy - abnl follicles /B cells 09/07/2019   Essential tremor 02/03/2017   Peripheral neuropathy 02/03/2017   Status post left hip replacement 07/17/2016   SOB (shortness of breath) 09/24/2015   History of breast cancer 03/23/2012    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/2011   Type 2 diabetes mellitus with complication, with long-term current use of insulin  (HCC) 04/24/2009   History of esophageal stricture 04/24/2009   GERD 04/24/2009   Irritable bowel syndrome with diarrhea 04/24/2009   Colon polyps 04/24/2009   Fibromyalgia 11/30/2006   Major depression, recurrent, chronic (HCC) 11/30/1978   Past Medical History:  Diagnosis Date   Anxiety    Arthritis    Breast cancer (HCC) 2010   left   Breast infection    left breast   Chest pain 09/24/2015   Complication of anesthesia    patient states she becomes combative with anesthesia with some surgeries   Depression    Diabetes mellitus    Diverticulosis    Esophageal stricture    Essential hypertension 09/07/2019   Family history of adverse reaction to anesthesia    father was also combative per patient report    Fibromyalgia    GERD (gastroesophageal reflux disease)    H/O lymph node biopsy - abnl follicles /B cells 09/07/2019   Dr. Charolett Copes: at risk for developing CLL.   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, estrogen receptor positive (HCC) 10/13/2019   Osteoarthritis of left hip 07/17/2016   Osteoporosis 09/07/2019   Palpitations 09/24/2015   Personal history of radiation therapy  Pneumonia    hx of    PONV (postoperative nausea and vomiting)    Shortness of breath 09/24/2015   weight related per patient    Tremor     Family History  Problem Relation Age of Onset   Diabetes Mother    Colon cancer Mother 102   Atrial fibrillation Father    CAD Father    Pancreatic cancer Father    Colon cancer Maternal Great-grandmother    Colon polyps Neg Hx    Esophageal cancer Neg Hx    Rectal cancer Neg Hx    Stomach cancer Neg Hx     Past Surgical History:  Procedure Laterality Date   ABDOMINAL  HYSTERECTOMY     APPENDECTOMY     BICEPT TENODESIS Right 12/14/2023   Procedure: BICEPS TENODESIS;  Surgeon: Jasmine Mesi, MD;  Location: [facility] OR;  Service: Orthopedics;  Laterality: Right;   BREAST LUMPECTOMY  2010   CHOLECYSTECTOMY     COLONOSCOPY  04/30/2009   Elvin Hammer   CYSTECTOMY     on thyroid    FRACTURE SURGERY     feet   HAND SURGERY     REVERSE SHOULDER ARTHROPLASTY Right 12/14/2023   Procedure: RIGHT REVERSE SHOULDER ARTHROPLASTY;  Surgeon: Jasmine Mesi, MD;  Location: [facility] OR;  Service: Orthopedics;  Laterality: Right;   TONSILLECTOMY     TOTAL HIP ARTHROPLASTY Left 07/17/2016   Procedure: LEFT TOTAL HIP ARTHROPLASTY ANTERIOR APPROACH;  Surgeon: Arnie Lao, MD;  Location: [facility] ORS;  Service: Orthopedics;  Laterality: Left;   TUBAL LIGATION     Social History   Occupational History   Occupation: Retired  Tobacco Use   Smoking status: Former    Current packs/day: 0.00    Types: Cigarettes    Quit date: 12/01/2003    Years since quitting: 20.3   Smokeless tobacco: Never   Tobacco comments:    Quit 10+ years ago  Vaping Use   Vaping status: Never Used  Substance and Sexual Activity   Alcohol use: No    Comment: Quit 25+ years ago.   Drug use: No   Sexual activity: Not on file

## 2024-04-10 DIAGNOSIS — M810 Age-related osteoporosis without current pathological fracture: Secondary | ICD-10-CM | POA: Diagnosis not present

## 2024-04-10 DIAGNOSIS — E1165 Type 2 diabetes mellitus with hyperglycemia: Secondary | ICD-10-CM | POA: Diagnosis not present

## 2024-04-10 DIAGNOSIS — E785 Hyperlipidemia, unspecified: Secondary | ICD-10-CM | POA: Diagnosis not present

## 2024-04-14 ENCOUNTER — Other Ambulatory Visit: Payer: Self-pay

## 2024-04-15 ENCOUNTER — Encounter: Payer: Self-pay | Admitting: Family Medicine

## 2024-04-17 MED ORDER — TIZANIDINE HCL 4 MG PO TABS: 4.0000 mg | ORAL_TABLET | Freq: Two times a day (BID) | ORAL | 3 refills | Status: AC

## 2024-05-11 ENCOUNTER — Ambulatory Visit: Payer: PPO

## 2024-05-11 VITALS — Ht 64.0 in | Wt 160.0 lb

## 2024-05-11 DIAGNOSIS — Z0000 Encounter for general adult medical examination without abnormal findings: Secondary | ICD-10-CM

## 2024-05-11 NOTE — Progress Notes (Signed)
 Subjective:   Cassandra Johnston is a 73 y.o. who presents for a Medicare Wellness preventive visit.  As a reminder, Annual Wellness Visits don't include a physical exam, and some assessments may be limited, especially if this visit is performed virtually. We may recommend an in-person follow-up visit with your provider if needed.  Visit Complete: Virtual I connected with  Cassandra Johnston on 05/11/24 by a audio enabled telemedicine application and verified that I am speaking with the correct person using two identifiers.  Patient Location: Home  Provider Location: Office/Clinic  I discussed the limitations of evaluation and management by telemedicine. The patient expressed understanding and agreed to proceed.  Vital Signs: Because this visit was a virtual/telehealth visit, some criteria may be missing or patient reported. Any vitals not documented were not able to be obtained and vitals that have been documented are patient reported.  VideoDeclined- This patient declined Librarian, academic. Therefore the visit was completed with audio only.  Persons Participating in Visit: Patient.  AWV Questionnaire: No: Patient Medicare AWV questionnaire was not completed prior to this visit.  Cardiac Risk Factors include: advanced age (>1men, >62 women);dyslipidemia;diabetes mellitus;hypertension     Objective:    Today's Vitals   05/11/24 1314  Weight: 160 lb (72.6 kg)  Height: 5' 4 (1.626 m)   Body mass index is 27.46 kg/m.     05/11/2024    1:18 PM 12/14/2023    1:29 PM 12/07/2023    8:54 AM 05/06/2023    1:36 PM 04/17/2022    8:52 AM 04/11/2021    8:51 AM 03/25/2020    2:52 PM  Advanced Directives  Does Patient Have a Medical Advance Directive? Yes Yes Yes Yes Yes Yes Yes  Type of Estate agent of [place];Living will Healthcare Power of [place];Living will Healthcare Power of [place];Living will Healthcare Power of  [place];Living will Healthcare Power of [place];Living will Healthcare Power of Attorney Living will;Healthcare Power of Attorney  Does patient want to make changes to medical advance directive?  No - Patient declined     No - Patient declined  Copy of Healthcare Power of Attorney in Chart? No - copy requested No - copy requested No - copy requested No - copy requested No - copy requested No - copy requested No - copy requested    Current Medications (verified) Outpatient Encounter Medications as of 05/11/2024  Medication Sig   acetaminophen  (TYLENOL ) 500 MG tablet Take 500-1,000 mg by mouth every 6 (six) hours as needed (pain.).   Biotin 5000 MCG TABS Take 10,000 mcg by mouth in the morning.   busPIRone  (BUSPAR ) 7.5 MG tablet TAKE 1 TABLET(7.5 MG) BY MOUTH TWICE DAILY   Continuous Glucose Receiver (FREESTYLE LIBRE 3 READER) DEVI one libre reader for 30 days   Continuous Glucose Sensor (FREESTYLE LIBRE 3 PLUS SENSOR) MISC SMARTSIG:1 Every 3 Days   DULoxetine  (CYMBALTA ) 60 MG capsule Take 1 capsule (60 mg total) by mouth at bedtime.   empagliflozin (JARDIANCE) 25 MG TABS tablet Take 25 mg by mouth in the morning.   famotidine  (PEPCID ) 20 MG tablet Take 40 mg by mouth in the morning.   insulin  glargine, 1 Unit Dial, (TOUJEO  SOLOSTAR) 300 UNIT/ML Solostar Pen Inject 45 Units into the skin at bedtime.   losartan  (COZAAR ) 100 MG tablet Take 1 tablet (100 mg total) by mouth daily.   metFORMIN  (GLUCOPHAGE ) 500 MG tablet Take 1,000 mg by mouth 2 (two) times daily.   ONETOUCH ULTRA  test strip TEST TID   rosuvastatin  (CRESTOR ) 10 MG tablet TAKE 1 TABLET(10 MG) BY MOUTH AT BEDTIME   Semaglutide,0.25 or 0.5MG /DOS, (OZEMPIC, 0.25 OR 0.5 MG/DOSE,) 2 MG/3ML SOPN Inject 1 mg into the skin every Monday.   tiZANidine  (ZANAFLEX ) 4 MG tablet Take 1 tablet (4 mg total) by mouth in the morning and at bedtime.   venlafaxine  XR (EFFEXOR -XR) 150 MG 24 hr capsule Take 1 capsule (150 mg total) by mouth daily with  breakfast.   denosumab  (PROLIA ) 60 MG/ML SOSY injection 60 mgonce every 180 days Subcutaneous (Patient not taking: Reported on 05/11/2024)   No facility-administered encounter medications on file as of 05/11/2024.    Allergies (verified) Naproxen, Fosamax [alendronate], and Sulfa antibiotics   History: Past Medical History:  Diagnosis Date   Anxiety    Arthritis    Breast cancer (HCC) 2010   left   Breast infection    left breast   Chest pain 09/24/2015   Complication of anesthesia    patient states she becomes combative with anesthesia with some surgeries   Depression    Diabetes mellitus    Diverticulosis    Esophageal stricture    Essential hypertension 09/07/2019   Family history of adverse reaction to anesthesia    father was also combative per patient report    Fibromyalgia    GERD (gastroesophageal reflux disease)    H/O lymph node biopsy - abnl follicles /B cells 09/07/2019   Dr. Charolett Copes: at risk for developing CLL.   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, estrogen receptor positive (HCC) 10/13/2019   Osteoarthritis of left hip 07/17/2016   Osteoporosis 09/07/2019   Palpitations 09/24/2015   Personal history of radiation therapy    Pneumonia    hx of    PONV (postoperative nausea and vomiting)    Shortness of breath 09/24/2015   weight related per patient    Tremor    Past Surgical History:  Procedure Laterality Date   ABDOMINAL HYSTERECTOMY     APPENDECTOMY     BICEPT TENODESIS Right 12/14/2023   Procedure: BICEPS TENODESIS;  Surgeon: Cassandra Mesi, MD;  Location: [facility] OR;  Service: Orthopedics;  Laterality: Right;   BREAST LUMPECTOMY  2010   CHOLECYSTECTOMY     COLONOSCOPY  04/30/2009   Elvin Hammer   CYSTECTOMY     on thyroid    FRACTURE SURGERY     feet   HAND SURGERY     REVERSE SHOULDER ARTHROPLASTY Right 12/14/2023   Procedure: RIGHT REVERSE  SHOULDER ARTHROPLASTY;  Surgeon: Cassandra Mesi, MD;  Location: [facility] OR;  Service: Orthopedics;  Laterality: Right;   TONSILLECTOMY     TOTAL HIP ARTHROPLASTY Left 07/17/2016   Procedure: LEFT TOTAL HIP ARTHROPLASTY ANTERIOR APPROACH;  Surgeon: Arnie Lao, MD;  Location: [facility] ORS;  Service: Orthopedics;  Laterality: Left;   TUBAL LIGATION     Family History  Problem Relation Age of Onset   Diabetes Mother    Colon cancer Mother 66   Atrial fibrillation Father    CAD Father    Pancreatic cancer Father    Colon cancer Maternal Great-grandmother    Colon polyps Neg Hx    Esophageal cancer Neg Hx    Rectal cancer Neg Hx    Stomach cancer Neg Hx    Social History   Socioeconomic History   Marital status: Married  Spouse name: Not on file   Number of children: 2   Years of education: Associates   Highest education level: Associate degree: academic program  Occupational History   Occupation: Retired  Tobacco Use   Smoking status: Former    Current packs/day: 0.00    Types: Cigarettes    Quit date: 12/01/2003    Years since quitting: 20.4   Smokeless tobacco: Never   Tobacco comments:    Quit 10+ years ago  Vaping Use   Vaping status: Never Used  Substance and Sexual Activity   Alcohol use: No    Comment: Quit 25+ years ago.   Drug use: No   Sexual activity: Not on file  Other Topics Concern   Not on file  Social History Narrative   Epworth Sleepiness Scale = 9 (as of 09/24/2015)         Lives at home with husband and granddaughter.   Right-handed.   1 cup coffee per day.   Social Drivers of Corporate investment banker Strain: Low Risk  (05/11/2024)   Overall Financial Resource Strain (CARDIA)    Difficulty of Paying Living Expenses: Not hard at all  Food Insecurity: No Food Insecurity (05/11/2024)   Hunger Vital Sign    Worried About Running Out of Food in the Last Year: Never true    Ran Out of Food in the Last Year: Never true  Transportation  Needs: No Transportation Needs (05/11/2024)   PRAPARE - Administrator, Civil Service (Medical): No    Lack of Transportation (Non-Medical): No  Physical Activity: Inactive (05/11/2024)   Exercise Vital Sign    Days of Exercise per Week: 0 days    Minutes of Exercise per Session: 0 min  Stress: No Stress Concern Present (05/11/2024)   [employer] of Occupational Health - Occupational Stress Questionnaire    Feeling of Stress: Not at all  Social Connections: Moderately Integrated (05/11/2024)   Social Connection and Isolation Panel    Frequency of Communication with Friends and Family: More than three times a week    Frequency of Social Gatherings with Friends and Family: Twice a week    Attends Religious Services: More than 4 times per year    Active Member of [employer] or Organizations: No    Attends Engineer, structural: Never    Marital Status: Married    Tobacco Counseling Counseling given: Not Answered Tobacco comments: Quit 10+ years ago    Clinical Intake:  Pre-visit preparation completed: Yes  Pain : No/denies pain     BMI - recorded: 27.46 Nutritional Status: BMI 25 -29 Overweight Nutritional Risks: None Diabetes: Yes CBG done?: No Did pt. bring in CBG monitor from home?: No  Lab Results  Component Value Date   HGBA1C 7.0 (H) 02/22/2024   HGBA1C 6.3 (A) 11/29/2023   HGBA1C 7.6 08/16/2023     How often do you need to have someone help you when you read instructions, pamphlets, or other written materials from your doctor or pharmacy?: 1 - Never  Interpreter Needed?: No  Information entered by :: Lamont Pilsner, LPN   Activities of Daily Living      05/11/2024    1:23 PM 12/14/2023    1:23 PM  In your present state of health, do you have any difficulty performing the following activities:  Hearing? 0   Vision? 0   Difficulty concentrating or making decisions? 0   Walking or climbing stairs? 0   Dressing  or bathing? 0   Doing  errands, shopping? 0 0  Preparing Food and eating ? N   Using the Toilet? N   In the past six months, have you accidently leaked urine? N   Do you have problems with loss of bowel control? N   Managing your Medications? N   Managing your Finances? N   Housekeeping or managing your Housekeeping? N     Patient Care Team: Luevenia Saha, MD as PCP - General (Family Medicine) Juanita Norlander, MD as Consulting Physician (General Surgery) Magrinat, Rozella Cornfield, MD (Inactive) as Consulting Physician (Hematology and Oncology) Alta Ast, MD as Consulting Physician (Endocrinology) Arnie Lao, MD as Consulting Physician (Orthopedic Surgery) Phebe Brasil, MD as Consulting Physician (Neurology) Celia Coles Angus Kenning, DPM as Consulting Physician (Podiatry) Maudine Sos, MD as Attending Physician (Cardiology) Princella Brooklyn, OD as Consulting Physician (Optometry) Rozelle Corning, Maricela Shoe, MD as Consulting Physician (Orthopedic Surgery) Alma Jackson, NP as Nurse Practitioner (Nurse Practitioner)   I have updated your Care Teams any recent Medical Services you may have received from other providers in the past year.     Assessment:   This is a routine wellness examination for Cassandra Johnston.  Hearing/Vision screen Hearing Screening - Comments:: Pt denies any hearing issues  Vision Screening - Comments:: Wears rx glasses - up to date with routine eye exams with Dr Lasandra Points     Goals Addressed             This Visit's Progress    Patient Stated       Lose 10 lbs        Depression Screen      05/11/2024    1:16 PM 02/22/2024    8:17 AM 11/29/2023   11:44 AM 10/13/2023    2:04 PM 08/27/2023    9:43 AM 05/06/2023    1:34 PM 07/23/2022    9:51 AM  PHQ 2/9 Scores  PHQ - 2 Score 0 0 0 3 0 0 0  PHQ- 9 Score   0 15 4      Fall Risk      05/11/2024    1:20 PM 02/22/2024    8:17 AM 11/29/2023   11:44 AM 10/13/2023    2:03 PM 08/27/2023    9:42 AM  Fall Risk   Falls in the past  year? 1 1 1 1 1   Number falls in past yr: 1 1 1 1  0  Injury with Fall? 1 1 0 1 1  Comment right shoulder injury      Risk for fall due to : History of fall(s) History of fall(s) No Fall Risks No Fall Risks History of fall(s)  Follow up Falls prevention discussed Falls evaluation completed Falls evaluation completed Falls evaluation completed Falls evaluation completed    MEDICARE RISK AT HOME:   Medicare Risk at Home Any stairs in or around the home?: Yes If so, are there any without handrails?: No Home free of loose throw rugs in walkways, pet beds, electrical cords, etc?: Yes Adequate lighting in your home to reduce risk of falls?: Yes Life alert?: No Use of a cane, walker or w/c?: No Grab bars in the bathroom?: Yes Shower chair or bench in shower?: Yes Elevated toilet seat or a handicapped toilet?: Yes  TIMED UP AND GO:  Was the test performed?  No  Cognitive Function: 6CIT completed        05/11/2024    1:22 PM 05/06/2023    1:37 PM  04/17/2022    8:55 AM 04/11/2021    8:56 AM 03/25/2020    2:53 PM  6CIT Screen  What Year? 0 points 0 points 0 points 0 points 0 points  What month? 0 points 0 points 0 points 0 points 0 points  What time? 0 points 0 points 0 points  0 points  Count back from 20 0 points 0 points 0 points 0 points 0 points  Months in reverse 0 points 0 points 0 points 0 points 0 points  Repeat phrase 0 points 0 points 0 points 0 points 0 points  Total Score 0 points 0 points 0 points  0 points    Immunizations Immunization History  Administered Date(s) Administered   Fluad Quad(high Dose 65+) 09/07/2019   Fluad Trivalent(High Dose 65+) 08/27/2023   Influenza, High Dose Seasonal PF 09/25/2022   Influenza-Unspecified 07/29/2020   PFIZER(Purple Top)SARS-COV-2 Vaccination 12/11/2019, 01/03/2020, 11/12/2020   Pfizer(Comirnaty)Fall Seasonal Vaccine 12 years and older 09/25/2022   Pneumococcal Conjugate-13 11/06/2020   Pneumococcal Polysaccharide-23  09/07/2019   Zoster Recombinant(Shingrix ) 12/11/2021, 05/14/2022    Screening Tests Health Maintenance  Topic Date Due   OPHTHALMOLOGY EXAM  07/07/2022   COVID-19 Vaccine (5 - 2024-25 season) 08/01/2023   DEXA SCAN  12/25/2023   INFLUENZA VACCINE  06/30/2024   HEMOGLOBIN A1C  08/24/2024   FOOT EXAM  08/26/2024   MAMMOGRAM  10/25/2024   Diabetic kidney evaluation - eGFR measurement  02/21/2025   Diabetic kidney evaluation - Urine ACR  02/21/2025   Medicare Annual Wellness (AWV)  05/11/2025   Colonoscopy  01/29/2026   Pneumococcal Vaccine: 50+ Years  Completed   Hepatitis C Screening  Completed   Zoster Vaccines- Shingrix   Completed   HPV VACCINES  Aged Out   Meningococcal B Vaccine  Aged Out   DTaP/Tdap/Td  Discontinued    Health Maintenance  Health Maintenance Due  Topic Date Due   OPHTHALMOLOGY EXAM  07/07/2022   COVID-19 Vaccine (5 - 2024-25 season) 08/01/2023   DEXA SCAN  12/25/2023   Health Maintenance Items Addressed: See Nurse Notes at the end of this note  Additional Screening:  Vision Screening: Recommended annual ophthalmology exams for early detection of glaucoma and other disorders of the eye. Would you like a referral to an eye doctor? No    Dental Screening: Recommended annual dental exams for proper oral hygiene  Community Resource Referral / Chronic Care Management: CRR required this visit?  No   CCM required this visit?  No   Plan:    I have personally reviewed and noted the following in the patient's chart:   Medical and social history Use of alcohol, tobacco or illicit drugs  Current medications and supplements including opioid prescriptions. Patient is not currently taking opioid prescriptions. Functional ability and status Nutritional status Physical activity Advanced directives List of other physicians Hospitalizations, surgeries, and ER visits in previous 12 months Vitals Screenings to include cognitive, depression, and  falls Referrals and appointments  In addition, I have reviewed and discussed with patient certain preventive protocols, quality metrics, and best practice recommendations. A written personalized care plan for preventive services as well as general preventive health recommendations were provided to patient.   Bruno Capri, LPN   1/61/0960   After Visit Summary: (MyChart) Due to this being a telephonic visit, the after visit summary with patients personalized plan was offered to patient via MyChart   Notes: Nothing significant to report at this time.

## 2024-05-11 NOTE — Patient Instructions (Signed)
 Ms. Popoca , Thank you for taking time out of your busy schedule to complete your Annual Wellness Visit with me. I enjoyed our conversation and look forward to speaking with you again next year. I, as well as your care team,  appreciate your ongoing commitment to your health goals. Please review the following plan we discussed and let me know if I can assist you in the future. Your Game plan/ To Do List    Referrals: If you haven't heard from the office you've been referred to, please reach out to them at the phone provided.   Follow up Visits: Next Medicare AWV with our clinical staff: 05/17/25   Have you seen your provider in the last 6 months (3 months if uncontrolled diabetes)? Yes Next Office Visit with your provider: 06/05/24  Clinician Recommendations:  Aim for 30 minutes of exercise or brisk walking, 6-8 glasses of water , and 5 servings of fruits and vegetables each day.       This is a list of the screening recommended for you and due dates:  Health Maintenance  Topic Date Due   Eye exam for diabetics  07/07/2022   COVID-19 Vaccine (5 - 2024-25 season) 08/01/2023   DEXA scan (bone density measurement)  12/25/2023   Flu Shot  06/30/2024   Hemoglobin A1C  08/24/2024   Complete foot exam   08/26/2024   Mammogram  10/25/2024   Yearly kidney function blood test for diabetes  02/21/2025   Yearly kidney health urinalysis for diabetes  02/21/2025   Medicare Annual Wellness Visit  05/11/2025   Colon Cancer Screening  01/29/2026   Pneumococcal Vaccine for age over 52  Completed   Hepatitis C Screening  Completed   Zoster (Shingles) Vaccine  Completed   HPV Vaccine  Aged Out   Meningitis B Vaccine  Aged Out   DTaP/Tdap/Td vaccine  Discontinued    Advanced directives: (Copy Requested) Please bring a copy of your health care power of attorney and living will to the office to be added to your chart at your convenience. You can mail to [facility] [address]  [place], [place] [address] or email to [email or web address] Advance Care Planning is important because it:  [x]  Makes sure you receive the medical care that is consistent with your values, goals, and preferences  [x]  It provides guidance to your family and loved ones and reduces their decisional burden about whether or not they are making the right decisions based on your wishes.  Follow the link provided in your after visit summary or read over the paperwork we have mailed to you to help you started getting your Advance Directives in place. If you need assistance in completing these, please reach out to us  so that we can help you!  See attachments for Preventive Care and Fall Prevention Tips.

## 2024-05-22 ENCOUNTER — Ambulatory Visit: Admitting: Cardiology

## 2024-06-05 ENCOUNTER — Encounter: Payer: PPO | Admitting: Family Medicine

## 2024-06-15 NOTE — Progress Notes (Signed)
 No show

## 2024-06-16 ENCOUNTER — Ambulatory Visit: Attending: Cardiovascular Disease | Admitting: Cardiovascular Disease

## 2024-06-16 DIAGNOSIS — E118 Type 2 diabetes mellitus with unspecified complications: Secondary | ICD-10-CM

## 2024-06-16 DIAGNOSIS — Z794 Long term (current) use of insulin: Secondary | ICD-10-CM

## 2024-06-16 DIAGNOSIS — E782 Mixed hyperlipidemia: Secondary | ICD-10-CM

## 2024-06-16 DIAGNOSIS — I4719 Other supraventricular tachycardia: Secondary | ICD-10-CM

## 2024-06-16 DIAGNOSIS — E1169 Type 2 diabetes mellitus with other specified complication: Secondary | ICD-10-CM

## 2024-06-16 DIAGNOSIS — I700 Atherosclerosis of aorta: Secondary | ICD-10-CM

## 2024-06-16 DIAGNOSIS — R55 Syncope and collapse: Secondary | ICD-10-CM

## 2024-07-10 ENCOUNTER — Other Ambulatory Visit: Payer: Self-pay

## 2024-07-10 NOTE — Progress Notes (Signed)
 Contacted [facility]. Pharmacist confirmed that they two different prescriptions of tizanidine , both with different directions. Informed them that the most updated prescription was for the twice daily dosing, and to fill it from now on.   Brode Sculley  [place], [facility]

## 2024-07-10 NOTE — Progress Notes (Signed)
 Pharmacy Quality Measure Review  This patient is appearing on a report for being at risk of failing the adherence measure for cholesterol (statin) medications this calendar year.   Medication: rosuvastatin  10 mg daily Last fill date: 07/08/2024 for 90 day supply  Insurance report was not up to date. No action needed at this time.   Patient denied non-compliance with her statin therapy. She states that she has not had any issues with her losartan  or rosuvastatin  prescriptions.   However, she is concerned about her tizanidine  prescription because [facility] directions tell her to take it once daily, while her PCP told her to take twice a day. Patient states that she is supposed to be on twice daily dosing, not once daily. She would like an up-to-date prescription to be sent to [facility]. Will contact embedded pharmacist about tizanidine  prescription.   Keaghan Staton  [place], [facility]

## 2024-07-11 DIAGNOSIS — M810 Age-related osteoporosis without current pathological fracture: Secondary | ICD-10-CM | POA: Diagnosis not present

## 2024-07-11 DIAGNOSIS — E1165 Type 2 diabetes mellitus with hyperglycemia: Secondary | ICD-10-CM | POA: Diagnosis not present

## 2024-07-18 DIAGNOSIS — E1165 Type 2 diabetes mellitus with hyperglycemia: Secondary | ICD-10-CM | POA: Diagnosis not present

## 2024-07-18 DIAGNOSIS — I10 Essential (primary) hypertension: Secondary | ICD-10-CM | POA: Diagnosis not present

## 2024-07-18 DIAGNOSIS — E785 Hyperlipidemia, unspecified: Secondary | ICD-10-CM | POA: Diagnosis not present

## 2024-07-18 DIAGNOSIS — M810 Age-related osteoporosis without current pathological fracture: Secondary | ICD-10-CM | POA: Diagnosis not present

## 2024-09-07 ENCOUNTER — Other Ambulatory Visit: Payer: Self-pay | Admitting: Family Medicine

## 2024-09-07 MED ORDER — VENLAFAXINE HCL ER 150 MG PO CP24: 150.0000 mg | ORAL_CAPSULE | Freq: Every day | ORAL | 3 refills | Status: AC

## 2024-09-07 NOTE — Telephone Encounter (Signed)
 Copied from CRM [id number]. Topic: Clinical - Medication Refill >> Sep 07, 2024 12:19 PM Viola F wrote: Medication: venlafaxine  XR (EFFEXOR -XR) 150 MG 24 hr capsule [[id number]] - 90 quantity   busPIRone  (BUSPAR ) 7.5 MG tablet [[id number]] - 180 quantity   Has the patient contacted their pharmacy? Yes (Agent: If no, request that the patient contact the pharmacy for the refill. If patient does not wish to contact the pharmacy document the reason why and proceed with request.) (Agent: If yes, when and what did the pharmacy advise?)  This is the patient's preferred pharmacy:  [id number] - [place], [place] - [address] [place] [address] Phone: [phone] Fax: [phone]  Is this the correct pharmacy for this prescription? Yes If no, delete pharmacy and type the correct one.   Has the prescription been filled recently? Yes  Is the patient out of the medication? Yes  Has the patient been seen for an appointment in the last year OR does the patient have an upcoming appointment? Yes  Can we respond through MyChart? Yes  Agent: Please be advised that Rx refills may take up to 3 business days. We ask that you follow-up with your pharmacy.

## 2024-09-11 ENCOUNTER — Other Ambulatory Visit: Payer: Self-pay | Admitting: Family Medicine

## 2024-09-11 NOTE — Telephone Encounter (Unsigned)
 Copied from CRM [id number]. Topic: Clinical - Medication Refill >> Sep 11, 2024  3:07 PM Drema MATSU wrote: Medication: busPIRone  (BUSPAR ) 7.5 MG tablet, losartan  (COZAAR ) 100 MG tablet  Has the patient contacted their pharmacy? Yes (Agent: If no, request that the patient contact the pharmacy for the refill. If patient does not wish to contact the pharmacy document the reason why and proceed with request.) (Agent: If yes, when and what did the pharmacy advise?)  This is the patient's preferred pharmacy:  [id number] - [place], [place] - [address] [place] [address] Phone: [phone] Fax: [phone]  Is this the correct pharmacy for this prescription? Yes If no, delete pharmacy and type the correct one.   Has the prescription been filled recently? Yes  Is the patient out of the medication? Yes  Has the patient been seen for an appointment in the last year OR does the patient have an upcoming appointment? Yes  Can we respond through MyChart? Yes  Agent: Please be advised that Rx refills may take up to 3 business days. We ask that you follow-up with your pharmacy.

## 2024-09-12 MED ORDER — BUSPIRONE HCL 7.5 MG PO TABS: ORAL_TABLET | ORAL | 3 refills | Status: AC

## 2024-09-12 MED ORDER — LOSARTAN POTASSIUM 100 MG PO TABS: 100.0000 mg | ORAL_TABLET | Freq: Every day | ORAL | 3 refills | Status: AC

## 2024-09-25 ENCOUNTER — Other Ambulatory Visit: Payer: Self-pay | Admitting: Medical Genetics

## 2024-09-25 DIAGNOSIS — Z006 Encounter for examination for normal comparison and control in clinical research program: Secondary | ICD-10-CM

## 2024-09-28 ENCOUNTER — Other Ambulatory Visit: Payer: Self-pay | Admitting: Pharmacist

## 2024-09-28 ENCOUNTER — Encounter: Payer: Self-pay | Admitting: Pharmacist

## 2024-09-28 NOTE — Progress Notes (Signed)
 Pharmacy Quality Measure Review  This patient is appearing on a report for being at risk of failing the adherence measure for cholesterol (statin) medications this calendar year.   Medication: rosuvastatin   Last fill date: 07/07/2024 for 90 day supply   Reviewed recent refill history in Dr Annemarie database.  Patient's current rosuvastatin  prescription of over a year old and cannot be refill when next due. She does not currently have a follow up scheduled with PCP. Appointment in July 2025 for CPE was cancelled.   Sent refill request to PCP.  Sent message to clinical team to reach out to patient to reschedule her CPE with Dr Jodie.  Madelin Ray, PharmD Clinical Pharmacist [facility]  Population Health [phone]

## 2024-10-02 ENCOUNTER — Encounter: Payer: Self-pay | Admitting: Radiology

## 2024-10-03 MED ORDER — ROSUVASTATIN CALCIUM 10 MG PO TABS: ORAL_TABLET | ORAL | 0 refills | Status: AC

## 2024-10-24 ENCOUNTER — Encounter: Payer: Self-pay | Admitting: Family Medicine

## 2024-10-24 ENCOUNTER — Other Ambulatory Visit: Payer: Self-pay | Admitting: Family Medicine

## 2024-11-14 ENCOUNTER — Emergency Department (HOSPITAL_BASED_OUTPATIENT_CLINIC_OR_DEPARTMENT_OTHER)
Admission: EM | Admit: 2024-11-14 | Discharge: 2024-11-14 | Disposition: A | Discharge status: Home / Self Care | Source: Home / Self Care

## 2024-11-14 ENCOUNTER — Other Ambulatory Visit: Payer: Self-pay

## 2024-11-14 ENCOUNTER — Emergency Department (HOSPITAL_BASED_OUTPATIENT_CLINIC_OR_DEPARTMENT_OTHER): Admitting: Radiology

## 2024-11-14 DIAGNOSIS — J101 Influenza due to other identified influenza virus with other respiratory manifestations: Secondary | ICD-10-CM

## 2024-11-14 DIAGNOSIS — Z7984 Long term (current) use of oral hypoglycemic drugs: Secondary | ICD-10-CM | POA: Diagnosis not present

## 2024-11-14 DIAGNOSIS — Z79899 Other long term (current) drug therapy: Secondary | ICD-10-CM | POA: Diagnosis not present

## 2024-11-14 DIAGNOSIS — I10 Essential (primary) hypertension: Secondary | ICD-10-CM | POA: Diagnosis not present

## 2024-11-14 DIAGNOSIS — R059 Cough, unspecified: Secondary | ICD-10-CM | POA: Diagnosis present

## 2024-11-14 DIAGNOSIS — Z853 Personal history of malignant neoplasm of breast: Secondary | ICD-10-CM | POA: Diagnosis not present

## 2024-11-14 DIAGNOSIS — Z794 Long term (current) use of insulin: Secondary | ICD-10-CM | POA: Diagnosis not present

## 2024-11-14 DIAGNOSIS — E119 Type 2 diabetes mellitus without complications: Secondary | ICD-10-CM | POA: Diagnosis not present

## 2024-11-14 LAB — RESP PANEL BY RT-PCR (RSV, FLU A&B, COVID)  RVPGX2
Influenza A by PCR: POSITIVE — AB
Influenza B by PCR: NEGATIVE
Resp Syncytial Virus by PCR: NEGATIVE
SARS Coronavirus 2 by RT PCR: NEGATIVE

## 2024-11-14 MED ORDER — BENZONATATE 100 MG PO CAPS: 100.0000 mg | ORAL_CAPSULE | Freq: Three times a day (TID) | ORAL | 0 refills | Status: AC

## 2024-11-14 NOTE — ED Triage Notes (Signed)
 C/o cough and congestion since last Wednesday.Denis fever.

## 2024-11-14 NOTE — Discharge Instructions (Signed)
 As we discussed, you were diagnosed today with influenza A which is likely the cause of your symptoms.  I have given you some Tessalon  Perles which could help you with the cough.  Also steam in the shower and honey is also very helpful for the cough.  I would like for you to follow-up with your primary care doctor for further evaluation.  You may return to the emergency department for any worsening symptoms.

## 2024-11-14 NOTE — ED Provider Notes (Signed)
 [place] EMERGENCY DEPARTMENT AT [facility] Provider Note   CSN: [id number] Arrival date & time: 11/14/24  9148     Patient presents with: Cough   Cassandra Johnston is a 73 y.o. female patient with history of hypertension, breast cancer in remission, diabetes who presents to the emergency department today for further evaluation of cough, fatigue, and nasal congestion which has been ongoing for 7 days.  Husband was sick prior.  She denies any shortness of breath or chest pain.  Does endorse low-grade subjective fever intermittently.  No abdominal pain, nausea, vomiting, diarrhea.  Cough is overall nonproductive.    Cough      Prior to Admission medications  Medication Sig Start Date End Date Taking? Authorizing Provider  benzonatate  (TESSALON ) 100 MG capsule Take 1 capsule (100 mg total) by mouth every 8 (eight) hours. 11/14/24  Yes Orris Perin M, PA-C  acetaminophen  (TYLENOL ) 500 MG tablet Take 500-1,000 mg by mouth every 6 (six) hours as needed (pain.).    [provider]  Biotin 5000 MCG TABS Take 10,000 mcg by mouth in the morning.    [provider]  busPIRone  (BUSPAR ) 7.5 MG tablet TAKE 1 TABLET(7.5 MG) BY MOUTH TWICE DAILY 09/12/24   Jodie Lavern CROME, MD  Continuous Glucose Receiver (FREESTYLE LIBRE 3 READER) DEVI one libre reader for 30 days 08/16/23   [provider]  Continuous Glucose Sensor (FREESTYLE LIBRE 3 PLUS SENSOR) MISC SMARTSIG:1 Every 3 Days    [provider]  denosumab  (PROLIA ) 60 MG/ML SOSY injection 60 mgonce every 180 days Subcutaneous Patient not taking: Reported on 05/11/2024    [provider]  DULoxetine  (CYMBALTA ) 60 MG capsule TAKE 1 CAPSULE(60 MG) BY MOUTH AT BEDTIME 10/24/24   Jodie Lavern CROME, MD  empagliflozin (JARDIANCE) 25 MG TABS tablet Take 25 mg by mouth in the morning. 07/01/19   [provider]  famotidine  (PEPCID ) 20 MG tablet Take 40 mg by mouth in the morning.     [provider]  insulin  glargine, 1 Unit Dial, (TOUJEO  SOLOSTAR) 300 UNIT/ML Solostar Pen Inject 45 Units into the skin at bedtime.    [provider]  losartan  (COZAAR ) 100 MG tablet Take 1 tablet (100 mg total) by mouth daily. 09/12/24   Jodie Lavern CROME, MD  metFORMIN  (GLUCOPHAGE ) 500 MG tablet Take 1,000 mg by mouth 2 (two) times daily. 06/26/15   [provider]  AISHA FLING test strip TEST TID 07/21/19   [provider]  rosuvastatin  (CRESTOR ) 10 MG tablet TAKE 1 TABLET(10 MG) BY MOUTH AT BEDTIME 10/03/24   Jodie Lavern CROME, MD  Semaglutide,0.25 or 0.5MG /DOS, (OZEMPIC, 0.25 OR 0.5 MG/DOSE,) 2 MG/3ML SOPN Inject 1 mg into the skin every Monday. 04/09/22   [provider]  tiZANidine  (ZANAFLEX ) 4 MG tablet Take 1 tablet (4 mg total) by mouth in the morning and at bedtime. 04/17/24   Jodie Lavern CROME, MD  venlafaxine  XR (EFFEXOR -XR) 150 MG 24 hr capsule Take 1 capsule (150 mg total) by mouth daily with breakfast. 09/07/24   Jodie Lavern CROME, MD    Allergies: Naproxen, Fosamax [alendronate], and Sulfa antibiotics    Review of Systems  Respiratory:  Positive for cough.   All other systems reviewed and are negative.   Updated Vital Signs BP (!) 158/93 (BP Location: Right Arm)   Pulse 71   Temp 98.4 F (36.9 C) (Oral)   Resp 18   SpO2 99%   Physical Exam Vitals and nursing note  reviewed.  Constitutional:      General: She is not in acute distress.    Appearance: Normal appearance.  HENT:     Head: Normocephalic and atraumatic.  Eyes:     General:        Right eye: No discharge.        Left eye: No discharge.  Cardiovascular:     Comments: Regular rate and rhythm.  S1/S2 are distinct without any evidence of murmur, rubs, or gallops.  Radial pulses are 2+ bilaterally.  Dorsalis pedis pulses are 2+ bilaterally.  No evidence of pedal edema. Pulmonary:     Comments: Clear to auscultation bilaterally.  Normal effort.  No respiratory distress.   No evidence of wheezes, rales, or rhonchi heard throughout. Abdominal:     General: Abdomen is flat. Bowel sounds are normal. There is no distension.     Tenderness: There is no abdominal tenderness. There is no guarding or rebound.  Musculoskeletal:        General: Normal range of motion.     Cervical back: Neck supple.  Skin:    General: Skin is warm and dry.     Findings: No rash.  Neurological:     General: No focal deficit present.     Mental Status: She is alert.  Psychiatric:        Mood and Affect: Mood normal.        Behavior: Behavior normal.     (all labs ordered are listed, but only abnormal results are displayed) Labs Reviewed  RESP PANEL BY RT-PCR (RSV, FLU A&B, COVID)  RVPGX2 - Abnormal; Notable for the following components:      Result Value   Influenza A by PCR POSITIVE (*)    All other components within normal limits    EKG: None  Radiology: DG Chest 2 View Result Date: 11/14/2024 CLINICAL DATA:  Cough. EXAM: CHEST - 2 VIEW COMPARISON:  09/07/2019 FINDINGS: Lungs are adequately inflated and otherwise clear. Cardiomediastinal silhouette is normal. 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 Remainder of the exam is unchanged. IMPRESSION: No active cardiopulmonary disease. Electronically Signed   By: Toribio Agreste M.D.   On: 11/14/2024 09:25     Procedures   Medications Ordered in the ED - No data to display   Medical Decision Making Cassandra Johnston is a 73 y.o. female patient who presents to the emergency department today for further evaluation of nonproductive cough, fatigue, and nasal congestion.  Initial workup was a started in triage.  Chest x-ray does not reveal any significant signs of pneumonia.  She did test positive for influenza A which is likely the source of her symptoms.  Although patient would likely benefit from Tamiflu she was outside the window from a time perspective.  Patient is overall nontoxic-appearing.  We discussed  over-the-counter remedies like honey and steam for the cough.  Will also give her a short course of Tessalon  Perles.  I will have her follow-up with her primary care doctor.  Strict return precautions were discussed.  She is safe for discharge.   Amount and/or Complexity of Data Reviewed Radiology: ordered.  Risk Prescription drug management.     Final diagnoses:  Influenza A    ED Discharge Orders          Ordered    benzonatate  (TESSALON ) 100 MG capsule  Every 8 hours        11/14/24 0957  Theotis Cameron HERO, PA-C 11/14/24 1000    Neysa Caron PARAS, DO 11/14/24 1428

## 2024-11-16 ENCOUNTER — Telehealth: Payer: Self-pay

## 2024-11-16 NOTE — Telephone Encounter (Signed)
 Transition Care Management Follow-up Telephone Call Date of discharge and from where: 11/14/24 [facility] How have you been since you were released from the hospital? Same Any questions or concerns? Yes; pt cough has got worse and Rx given to pt at ED not working. Asking if something better can be sent to her.   Items Reviewed: Did the pt receive and understand the discharge instructions provided? Yes  Medications obtained and verified? Yes  Other? No  Any new allergies since your discharge? No  Dietary orders reviewed? No Do you have support at home? Yes   Home Care and Equipment/Supplies: Were home health services ordered? not applicable If so, what is the name of the agency?   Has the agency set up a time to come to the patient's home? not applicable Were any new equipment or medical supplies ordered?  No What is the name of the medical supply agency?  Were you able to get the supplies/equipment? not applicable Do you have any questions related to the use of the equipment or supplies? No  Functional Questionnaire: (I = Independent and D = Dependent) ADLs: I  Bathing/Dressing- I  Meal Prep- I  Eating- I  Maintaining continence- I  Transferring/Ambulation- I  Managing Meds- I  Follow up appointments reviewed:  PCP Hospital f/u appt confirmed? No  Pt declined appt at this time but will call if not better by Monday. Specialist Hospital f/u appt confirmed? N/A  Are transportation arrangements needed? No  If their condition worsens, is the pt aware to call PCP or go to the Emergency Dept.? Yes Was the patient provided with contact information for the PCP's office or ED? Yes Was to pt encouraged to call back with questions or concerns? Yes

## 2024-11-20 ENCOUNTER — Encounter: Payer: Self-pay | Admitting: Family Medicine

## 2024-11-21 MED ORDER — GUAIFENESIN-CODEINE 100-10 MG/5ML PO SOLN: 5.0000 mL | Freq: Four times a day (QID) | ORAL | 0 refills | Status: AC | PRN

## 2024-11-21 NOTE — Telephone Encounter (Signed)
 Please review. Tks

## 2025-05-17 ENCOUNTER — Ambulatory Visit
